# Patient Record
Sex: Male | Born: 1950 | Race: White | Hispanic: No | Smoking: Never smoker
Health system: Southern US, Academic
[De-identification: ages and names within clinical notes are randomized; demographics above are authoritative.]

## PROBLEM LIST (undated history)

## (undated) DIAGNOSIS — I4891 Unspecified atrial fibrillation: Secondary | ICD-10-CM

## (undated) DIAGNOSIS — I10 Essential (primary) hypertension: Secondary | ICD-10-CM

## (undated) DIAGNOSIS — S72009A Fracture of unspecified part of neck of unspecified femur, initial encounter for closed fracture: Secondary | ICD-10-CM

## (undated) DIAGNOSIS — K625 Hemorrhage of anus and rectum: Secondary | ICD-10-CM

## (undated) DIAGNOSIS — G43909 Migraine, unspecified, not intractable, without status migrainosus: Secondary | ICD-10-CM

## (undated) DIAGNOSIS — Z973 Presence of spectacles and contact lenses: Secondary | ICD-10-CM

## (undated) DIAGNOSIS — F419 Anxiety disorder, unspecified: Secondary | ICD-10-CM

## (undated) DIAGNOSIS — E7800 Pure hypercholesterolemia, unspecified: Secondary | ICD-10-CM

## (undated) DIAGNOSIS — Z7901 Long term (current) use of anticoagulants: Secondary | ICD-10-CM

## (undated) DIAGNOSIS — K219 Gastro-esophageal reflux disease without esophagitis: Secondary | ICD-10-CM

## (undated) DIAGNOSIS — Z9889 Other specified postprocedural states: Secondary | ICD-10-CM

## (undated) HISTORY — DX: Unspecified atrial fibrillation: I48.91

## (undated) HISTORY — DX: Long term (current) use of anticoagulants: Z79.01

## (undated) HISTORY — DX: Hemorrhage of anus and rectum: K62.5

## (undated) HISTORY — DX: Anxiety disorder, unspecified: F41.9

## (undated) HISTORY — DX: Migraine, unspecified, not intractable, without status migrainosus: G43.909

## (undated) HISTORY — DX: Essential (primary) hypertension: I10

## (undated) HISTORY — PX: KNEE ARTHROSCOPY: SUR90

## (undated) HISTORY — DX: Pure hypercholesterolemia, unspecified: E78.00

## (undated) HISTORY — DX: Other specified postprocedural states: Z98.890

---

## 2012-07-12 DIAGNOSIS — Z9889 Other specified postprocedural states: Secondary | ICD-10-CM | POA: Insufficient documentation

## 2012-07-12 HISTORY — DX: Other specified postprocedural states: Z98.890

## 2019-01-10 ENCOUNTER — Ambulatory Visit (HOSPITAL_COMMUNITY): Payer: Self-pay

## 2021-05-20 IMAGING — MR MRI KNEE LT W/O CONTRAST
5 series · 40 of 40 positions shown · IV contrast (gadolinium)
Comparison: None available.

﻿EXAM:  02049   MRI KNEE LT W/O CONTRAST
INDICATION: New onset knee pain.
TECHNIQUE: Multiplanar multisequential MRI of the left knee joint was performed without gadolinium contrast.

[Series 5: PD fat-sat · axial · left · 4.0mm · 0.53mm/px · z∈[-71,+59]mm · 8 of 30 slices shown (1 of 3)]
[im 1/30]
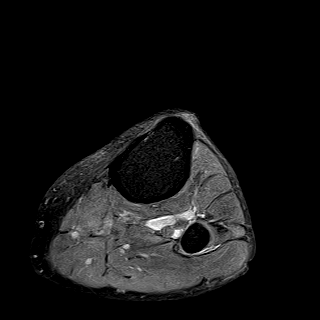
[im 5/30]
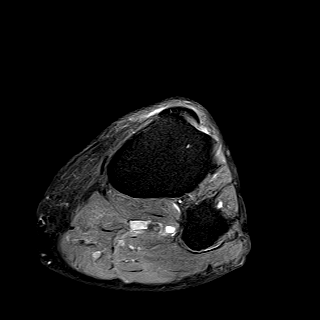
[im 9/30]
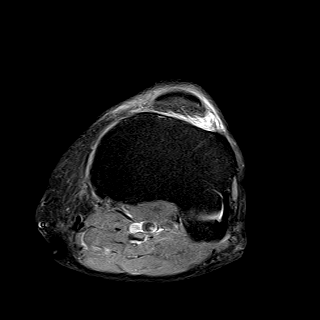
[im 13/30]
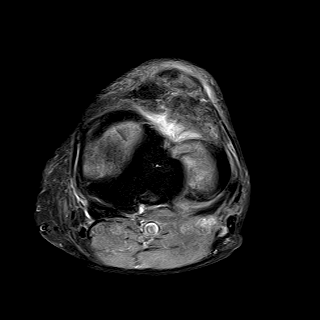
[im 17/30]
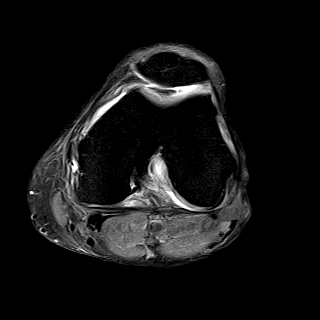
[im 21/30]
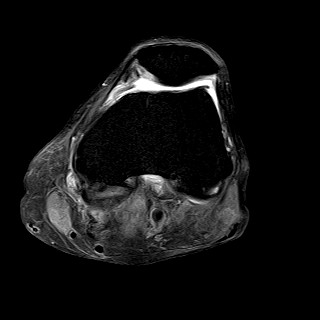
[im 25/30]
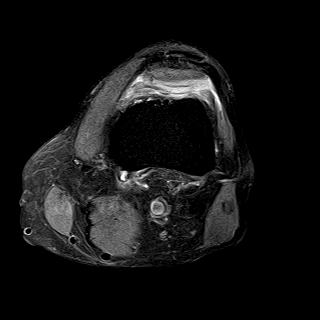
[im 30/30]
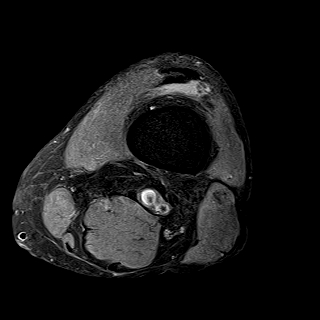

[Series 6: PD fat-sat · sagittal · left · 3.0mm · 0.47mm/px · 8 of 30 slices shown (2 of 3)]
[im 1/30]
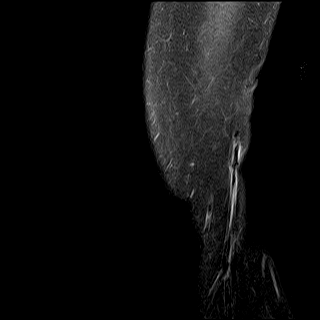
[im 5/30]
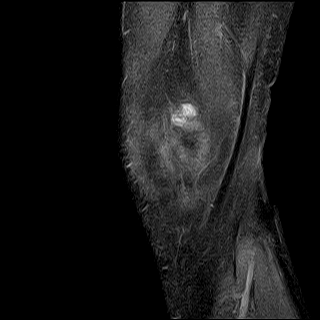
[im 9/30]
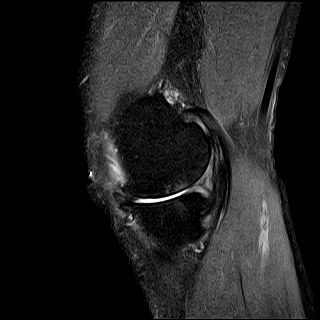
[im 13/30]
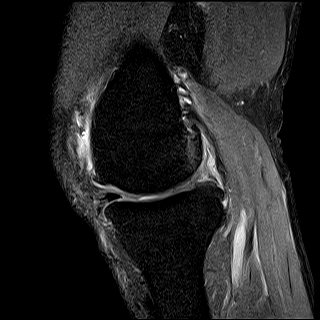
[im 17/30]
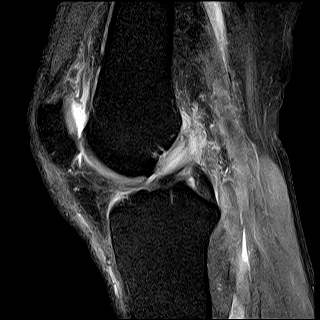
[im 21/30]
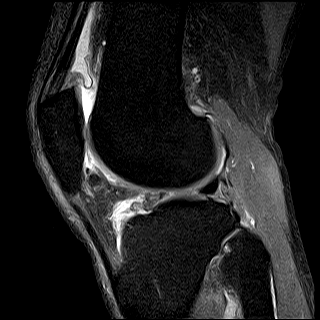
[im 25/30]
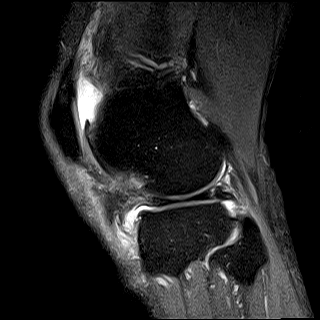
[im 30/30]
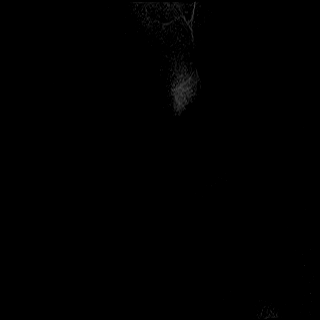

[Series 7: STIR · coronal · left · 3.0mm · 0.47mm/px · 8 of 28 slices shown]
[im 1/28]
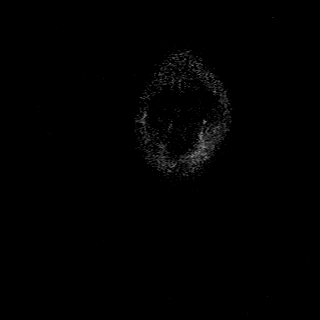
[im 4/28]
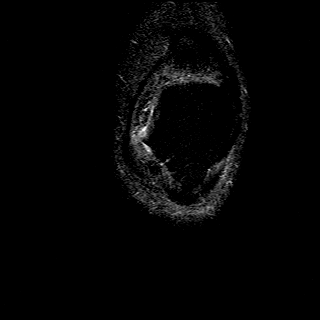
[im 8/28]
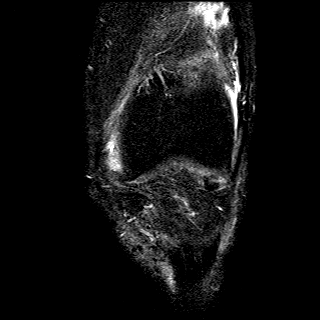
[im 12/28]
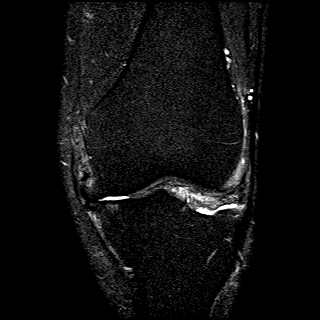
[im 16/28]
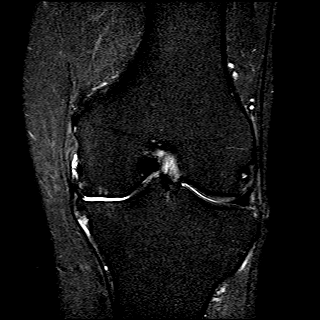
[im 20/28]
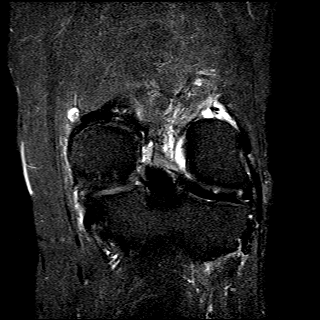
[im 24/28]
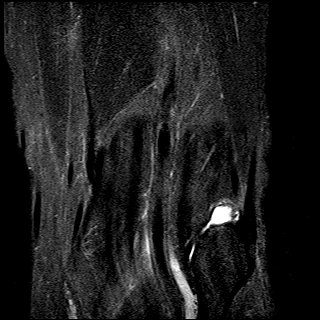
[im 28/28]
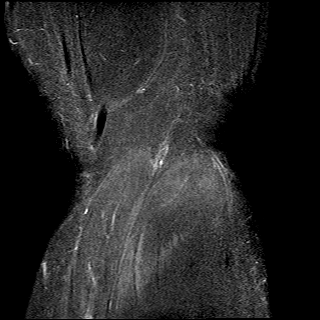

[Series 8: T1 · sagittal · left · 3.0mm · 0.39mm/px · 8 of 30 slices shown]
[im 1/30]
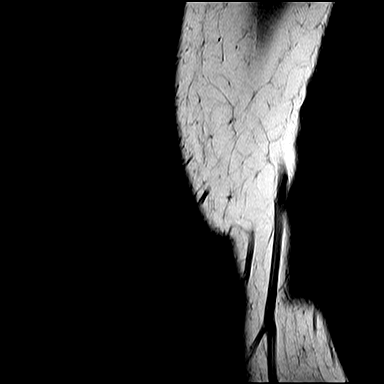
[im 5/30]
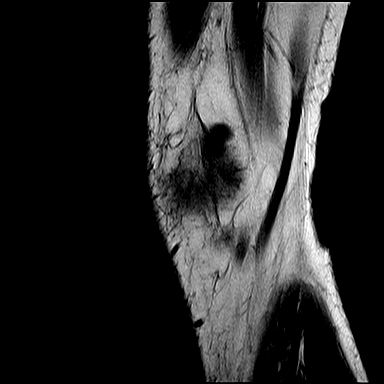
[im 9/30]
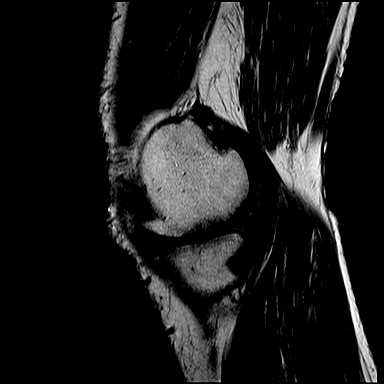
[im 13/30]
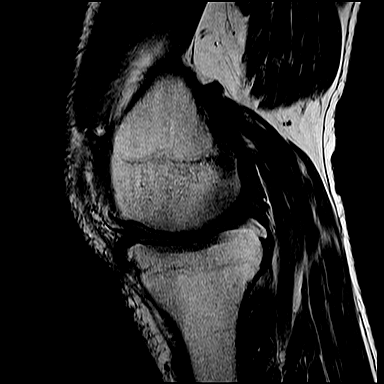
[im 17/30]
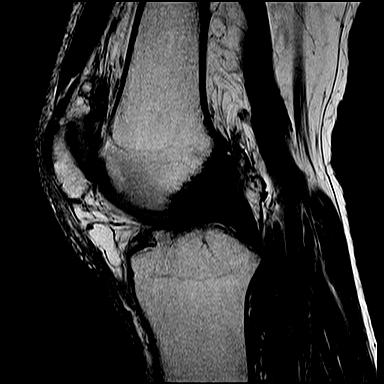
[im 21/30]
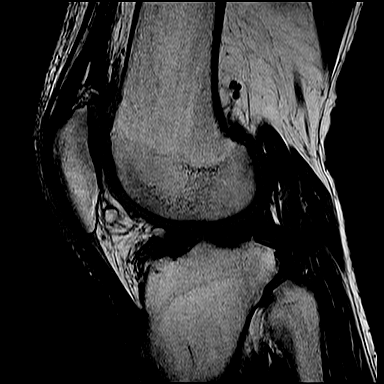
[im 25/30]
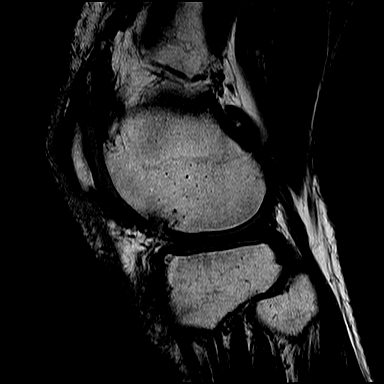
[im 30/30]
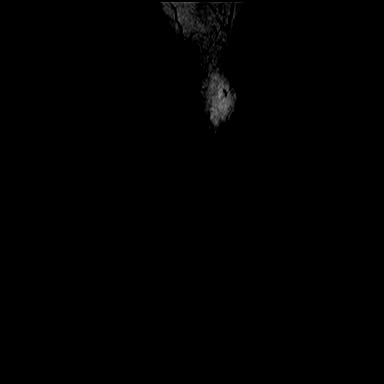

[Series 9: PD fat-sat · coronal · left · 3.0mm · 0.47mm/px · 8 of 28 slices shown (3 of 3)]
[im 1/28]
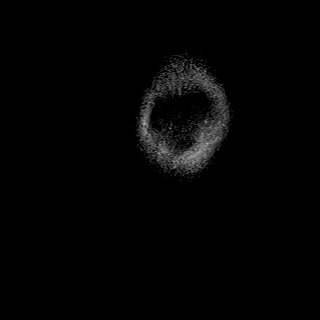
[im 4/28]
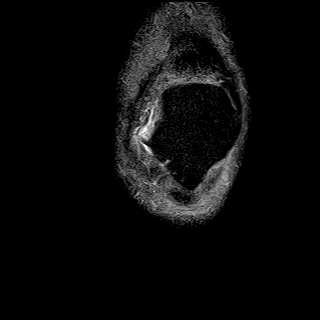
[im 8/28]
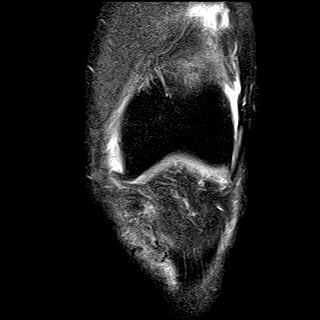
[im 12/28]
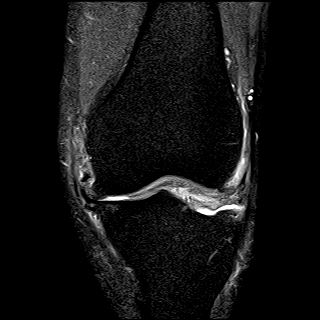
[im 16/28]
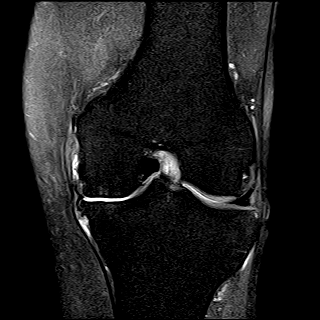
[im 20/28]
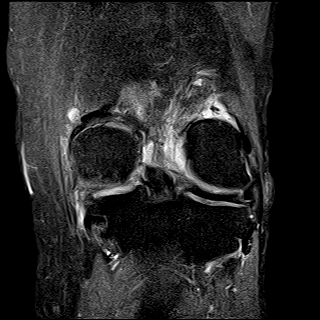
[im 24/28]
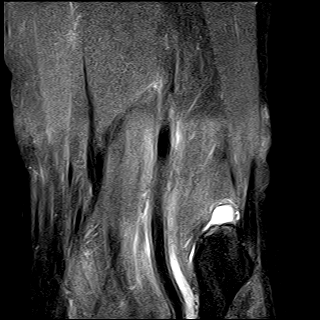
[im 28/28]
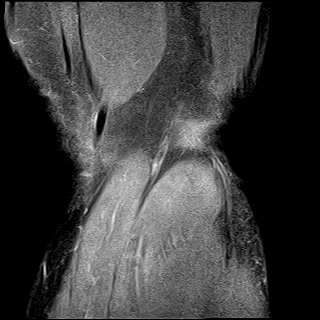

[40 of 40 positions shown; findings below may reference images not displayed]

FINDINGS: There is a complex medial meniscus tear. There is also suggestion of a subtle tear involving the anterior horn of the lateral meniscus. Cruciate and collateral ligaments are intact, within normal limits in morphology and signal intensity. There is grade 4 chondromalacia of the medial tibiofemoral articulation. Extensor mechanism is intact. A few intra-articular loose bodies are also seen inferior to the patella, measuring up to 8 mm. Capsular attachments appear unremarkable. Bone marrow signal intensity is normal. There is a small suprapatellar effusion. There is no Baker's cyst.
IMPRESSION: 1. Complex medial meniscus tear. 

2. Suggestion of a subtle tear involving the anterior horn of the lateral meniscus. 

3. Grade 4 chondromalacia of the medial tibiofemoral articulation and a few intra-articular loose bodies inferior to the patella.

## 2021-12-11 DIAGNOSIS — M1611 Unilateral primary osteoarthritis, right hip: Secondary | ICD-10-CM | POA: Insufficient documentation

## 2021-12-11 DIAGNOSIS — M7061 Trochanteric bursitis, right hip: Secondary | ICD-10-CM | POA: Insufficient documentation

## 2021-12-18 DIAGNOSIS — M79641 Pain in right hand: Secondary | ICD-10-CM | POA: Insufficient documentation

## 2022-02-18 ENCOUNTER — Telehealth (RURAL_HEALTH_CENTER): Payer: Self-pay | Admitting: Family Medicine

## 2022-02-18 ENCOUNTER — Encounter (RURAL_HEALTH_CENTER): Payer: Self-pay | Admitting: Family Medicine

## 2022-02-18 MED ORDER — PANTOPRAZOLE 40 MG TABLET,DELAYED RELEASE
40.0000 mg | DELAYED_RELEASE_TABLET | Freq: Every day | ORAL | 1 refills | Status: DC
Start: 2022-02-18 — End: 2022-04-02

## 2022-02-18 NOTE — Telephone Encounter (Signed)
Pharmacy called for refill pantoprazole dr bowling approved andsent/br

## 2022-02-27 ENCOUNTER — Telehealth (RURAL_HEALTH_CENTER): Payer: Self-pay | Admitting: Family Medicine

## 2022-02-27 MED ORDER — RIVAROXABAN 20 MG TABLET
20.0000 mg | ORAL_TABLET | Freq: Every evening | ORAL | 3 refills | Status: DC
Start: 2022-02-27 — End: 2022-04-02

## 2022-02-27 NOTE — Telephone Encounter (Signed)
Refill of Xarelto 2.'5mg'$  tablet

## 2022-02-27 NOTE — Telephone Encounter (Signed)
Refill sent/br

## 2022-03-03 ENCOUNTER — Encounter (RURAL_HEALTH_CENTER): Payer: Self-pay | Admitting: Family Medicine

## 2022-03-06 ENCOUNTER — Telehealth (RURAL_HEALTH_CENTER): Payer: Self-pay | Admitting: Family Medicine

## 2022-03-06 NOTE — Telephone Encounter (Signed)
Acyclovir 400 mg tablet   1 tab every 8 hours

## 2022-03-09 MED ORDER — ACYCLOVIR 400 MG TABLET
400.0000 mg | ORAL_TABLET | Freq: Three times a day (TID) | ORAL | 1 refills | Status: DC
Start: 2022-03-09 — End: 2022-04-02

## 2022-03-09 NOTE — Telephone Encounter (Signed)
Approved dr bowling and sent/br

## 2022-04-02 ENCOUNTER — Encounter (RURAL_HEALTH_CENTER): Payer: Self-pay | Admitting: Family Medicine

## 2022-04-02 ENCOUNTER — Ambulatory Visit (RURAL_HEALTH_CENTER): Payer: Managed Care, Other (non HMO) | Attending: Family Medicine | Admitting: Family Medicine

## 2022-04-02 ENCOUNTER — Other Ambulatory Visit: Payer: Self-pay

## 2022-04-02 ENCOUNTER — Ambulatory Visit: Payer: Managed Care, Other (non HMO) | Attending: Family Medicine | Admitting: Family Medicine

## 2022-04-02 VITALS — BP 120/70 | HR 68 | Temp 97.8°F | Ht 68.0 in | Wt 176.0 lb

## 2022-04-02 DIAGNOSIS — E559 Vitamin D deficiency, unspecified: Secondary | ICD-10-CM

## 2022-04-02 DIAGNOSIS — G43109 Migraine with aura, not intractable, without status migrainosus: Secondary | ICD-10-CM

## 2022-04-02 DIAGNOSIS — E162 Hypoglycemia, unspecified: Secondary | ICD-10-CM | POA: Insufficient documentation

## 2022-04-02 DIAGNOSIS — E785 Hyperlipidemia, unspecified: Secondary | ICD-10-CM

## 2022-04-02 DIAGNOSIS — F411 Generalized anxiety disorder: Secondary | ICD-10-CM

## 2022-04-02 DIAGNOSIS — R5382 Chronic fatigue, unspecified: Secondary | ICD-10-CM | POA: Insufficient documentation

## 2022-04-02 DIAGNOSIS — I10 Essential (primary) hypertension: Secondary | ICD-10-CM

## 2022-04-02 DIAGNOSIS — M542 Cervicalgia: Secondary | ICD-10-CM

## 2022-04-02 DIAGNOSIS — I480 Paroxysmal atrial fibrillation: Secondary | ICD-10-CM | POA: Insufficient documentation

## 2022-04-02 DIAGNOSIS — E538 Deficiency of other specified B group vitamins: Secondary | ICD-10-CM

## 2022-04-02 DIAGNOSIS — G43909 Migraine, unspecified, not intractable, without status migrainosus: Secondary | ICD-10-CM | POA: Insufficient documentation

## 2022-04-02 DIAGNOSIS — E7800 Pure hypercholesterolemia, unspecified: Secondary | ICD-10-CM | POA: Insufficient documentation

## 2022-04-02 DIAGNOSIS — D485 Neoplasm of uncertain behavior of skin: Secondary | ICD-10-CM

## 2022-04-02 LAB — COMPREHENSIVE METABOLIC PANEL, NON-FASTING
ALBUMIN/GLOBULIN RATIO: 1.7 — ABNORMAL HIGH (ref 0.8–1.4)
ALBUMIN: 4.1 g/dL (ref 3.5–5.7)
ALKALINE PHOSPHATASE: 47 U/L (ref 34–104)
ALT (SGPT): 17 U/L (ref 7–52)
ANION GAP: 3 mmol/L — ABNORMAL LOW (ref 10–20)
AST (SGOT): 20 U/L (ref 13–39)
BILIRUBIN TOTAL: 0.5 mg/dL (ref 0.3–1.2)
BUN/CREA RATIO: 19 (ref 6–22)
BUN: 21 mg/dL (ref 7–25)
CALCIUM, CORRECTED: 9.1 mg/dL (ref 8.9–10.8)
CALCIUM: 9.2 mg/dL (ref 8.6–10.3)
CHLORIDE: 111 mmol/L — ABNORMAL HIGH (ref 98–107)
CO2 TOTAL: 28 mmol/L (ref 21–31)
CREATININE: 1.09 mg/dL (ref 0.60–1.30)
ESTIMATED GFR: 72 mL/min/{1.73_m2} (ref >59)
GLOBULIN: 2.4 — ABNORMAL LOW (ref 2.9–5.4)
GLUCOSE: 89 mg/dL (ref 74–109)
OSMOLALITY, CALCULATED: 286 mOsm/kg (ref 270–290)
POTASSIUM: 4.4 mmol/L (ref 3.5–5.1)
PROTEIN TOTAL: 6.5 g/dL (ref 6.4–8.9)
SODIUM: 142 mmol/L (ref 136–145)

## 2022-04-02 LAB — CBC WITH DIFF
BASOPHIL #: 0 10*3/uL (ref 0.00–0.30)
BASOPHIL %: 1 % (ref 0–3)
EOSINOPHIL #: 0.1 10*3/uL (ref 0.00–0.80)
EOSINOPHIL %: 2 % (ref 0–7)
HCT: 40.7 % — ABNORMAL LOW (ref 42.0–51.0)
HGB: 13.8 g/dL (ref 13.5–18.0)
LYMPHOCYTE #: 1.2 10*3/uL (ref 1.10–5.00)
LYMPHOCYTE %: 30 % (ref 25–45)
MCH: 32.7 pg — ABNORMAL HIGH (ref 27.0–32.0)
MCHC: 34 g/dL (ref 32.0–36.0)
MCV: 96.3 fL (ref 78.0–99.0)
MONOCYTE #: 0.3 10*3/uL (ref 0.00–1.30)
MONOCYTE %: 6 % (ref 0–12)
MPV: 8.2 fL (ref 7.4–10.4)
NEUTROPHIL #: 2.5 10*3/uL (ref 1.80–8.40)
NEUTROPHIL %: 61 % (ref 40–76)
PLATELETS: 202 10*3/uL (ref 140–440)
RBC: 4.23 10*6/uL (ref 4.20–6.00)
RDW: 13.4 % (ref 11.6–14.8)
WBC: 4 10*3/uL (ref 4.0–10.5)
WBCS UNCORRECTED: 4 10*3/uL

## 2022-04-02 LAB — THYROID STIMULATING HORMONE (SENSITIVE TSH): TSH: 0.974 u[IU]/mL (ref 0.450–5.330)

## 2022-04-02 LAB — LIPID PANEL
CHOL/HDL RATIO: 3.8
CHOLESTEROL: 200 mg/dL — ABNORMAL HIGH (ref <200)
HDL CHOL: 52 mg/dL (ref 23–92)
LDL CALC: 92 mg/dL (ref 0–100)
TRIGLYCERIDES: 278 mg/dL — ABNORMAL HIGH (ref <=150)
VLDL CALC: 56 mg/dL — ABNORMAL HIGH (ref 0–50)

## 2022-04-02 LAB — VITAMIN D 25 TOTAL: VITAMIN D: 45 ng/mL (ref 30–100)

## 2022-04-02 LAB — IRON TRANSFERRIN AND TIBC
IRON (TRANSFERRIN) SATURATION: 45 % (ref 20–50)
IRON: 129 ug/dL (ref 50–212)
TOTAL IRON BINDING CAPACITY: 287 ug/dL (ref 250–450)
TRANSFERRIN: 205 mg/dL (ref 203–362)
UIBC: 158 ug/dL (ref 130–375)

## 2022-04-02 LAB — VITAMIN B12: VITAMIN B 12: 408 pg/mL (ref 180–914)

## 2022-04-02 LAB — HGA1C (HEMOGLOBIN A1C WITH EST AVG GLUCOSE): HEMOGLOBIN A1C: 5.5 % (ref 4.0–6.0)

## 2022-04-02 LAB — MAGNESIUM: MAGNESIUM: 2.1 mg/dL (ref 1.9–2.7)

## 2022-04-02 MED ORDER — ROSUVASTATIN 10 MG TABLET
10.0000 mg | ORAL_TABLET | Freq: Every evening | ORAL | 3 refills | Status: DC
Start: 2022-04-02 — End: 2022-04-09

## 2022-04-02 MED ORDER — TAMSULOSIN 0.4 MG CAPSULE
0.4000 mg | ORAL_CAPSULE | Freq: Every evening | ORAL | 3 refills | Status: DC
Start: 2022-04-02 — End: 2022-04-09

## 2022-04-02 MED ORDER — CLONAZEPAM 1 MG TABLET
1.0000 mg | ORAL_TABLET | Freq: Two times a day (BID) | ORAL | 0 refills | Status: DC
Start: 2022-04-02 — End: 2022-04-09

## 2022-04-02 MED ORDER — ACYCLOVIR 400 MG TABLET
400.0000 mg | ORAL_TABLET | Freq: Three times a day (TID) | ORAL | 1 refills | Status: DC
Start: 2022-04-02 — End: 2022-04-09

## 2022-04-02 MED ORDER — LISINOPRIL 20 MG TABLET
20.0000 mg | ORAL_TABLET | Freq: Two times a day (BID) | ORAL | 3 refills | Status: DC
Start: 2022-04-02 — End: 2022-04-09

## 2022-04-02 MED ORDER — CYANOCOBALAMIN (VIT B-12) 1,000 MCG/ML INJECTION SOLUTION
1000.0000 ug | INTRAMUSCULAR | 3 refills | Status: DC
Start: 2022-04-02 — End: 2022-04-09

## 2022-04-02 MED ORDER — RIVAROXABAN 20 MG TABLET
20.0000 mg | ORAL_TABLET | Freq: Every evening | ORAL | 3 refills | Status: DC
Start: 2022-04-02 — End: 2022-04-09

## 2022-04-02 MED ORDER — NURTEC ODT 75 MG DISINTEGRATING TABLET
1.0000 | ORAL_TABLET | Freq: Every day | ORAL | 5 refills | Status: DC
Start: 2022-04-02 — End: 2022-04-09

## 2022-04-02 MED ORDER — BACLOFEN 10 MG TABLET
10.0000 mg | ORAL_TABLET | Freq: Two times a day (BID) | ORAL | 0 refills | Status: DC
Start: 2022-04-02 — End: 2022-04-09

## 2022-04-02 MED ORDER — PANTOPRAZOLE 40 MG TABLET,DELAYED RELEASE
40.0000 mg | DELAYED_RELEASE_TABLET | Freq: Every day | ORAL | 1 refills | Status: DC
Start: 2022-04-02 — End: 2022-04-09

## 2022-04-02 NOTE — Procedures (Signed)
FAMILY MEDICINE, [place]  [place] [address]  Operated by [facility]  Procedure Note    Name: Ricky Grimes MRN:  [id number]   Date: 04/02/2022 Age: 71 y.o.       Generic Procedure  Performed by: Feliberto Harts, DO  Authorized by: Feliberto Harts, DO     Documentation:      Lesion left ear. Cleansed area with etoh X 3.  Debrided with gauze which caused the area to ooze consistently.  Liquid cryotherapy to the area X 5.  Silver nitrate for hemostasis.  Apply pressure if bleeding returns.        Feliberto Harts, DO

## 2022-04-02 NOTE — Progress Notes (Signed)
FAMILY MEDICINE, [place]  [place] [address]  Operated by [facility]     Name: Ricky Grimes MRN:  [id number]   Date of Birth: 1951/05/17 Age: 71 y.o.   Date: 04/02/2022  Time: 20:36     Provider: Feliberto Harts, DO    Assessment/Plan:    1. Neck pain  Tylenol with codeine did not help. Stop. Recommend try massage and get xrays. Ice packs/Heat.  May consider PT. Has been hanging drywall over his head and he can't quit now.  - XR CERVICAL SPINE COMPLETE (4 OR MORE VIEWS); Future  - AMB CONSULT/REFERRAL MASSAGE THERAPY-EXT; Future    2. Migraine with aura and without status migrainosus, not intractable  Nurtec helps relieve migraines. Rx given    3. Paroxysmal atrial fibrillation (CMS HCC)  Continue xarelto    4. B12 deficiency  Continue B12 once per week    5. Vitamin D deficiency  Recheck vitamin d levels  - VITAMIN D 25 TOTAL    6. Chronic fatigue    - VITAMIN B12  - IRON TRANSFERRIN AND TIBC    7. Primary hypertension  Controlled. Continue lisinopril  - THYROID STIMULATING HORMONE (SENSITIVE TSH)  - COMPREHENSIVE METABOLIC PANEL, NON-FASTING  - CBC/DIFF    8. Hypomagnesemia  - MAGNESIUM    9. Hyperlipidemia, unspecified hyperlipidemia type  Continue Crestor at night. Recheck lipids  - LIPID PANEL    10. Hypoglycemia  - HGA1C (HEMOGLOBIN A1C WITH EST AVG GLUCOSE)  Recommend eating meals with high protein or eat small, frequent meals.    11. GAD  Klonopin helps. Prescription refilled. Gets really anxious and has a difficult time without the medication. Will continue    12 Lesion of unknown malignant potential   I am concerned that this is a SCC so I did my best to eradicate it.  If it returns, he will need to see derm.      No follow-ups on file.  Orders Placed This Encounter   . XR CERVICAL SPINE COMPLETE (4 OR MORE VIEWS)   . HGA1C (HEMOGLOBIN A1C WITH EST AVG GLUCOSE)   . LIPID PANEL   . VITAMIN B12   . VITAMIN D 25 TOTAL   . THYROID  STIMULATING HORMONE (SENSITIVE TSH)   . COMPREHENSIVE METABOLIC PANEL, NON-FASTING   . CBC/DIFF   . IRON TRANSFERRIN AND TIBC   . MAGNESIUM   . CBC WITH DIFF   . AMB CONSULT/REFERRAL MASSAGE THERAPY-EXT   . tamsulosin (FLOMAX) 0.4 mg Oral Capsule   . rosuvastatin (CRESTOR) 10 mg Oral Tablet   . rivaroxaban (XARELTO) 20 mg Oral Tablet   . rimegepant (NURTEC ODT) 75 mg Oral Tablet, Rapid Dissolve   . pantoprazole (PROTONIX) 40 mg Oral Tablet, Delayed Release (E.C.)   . lisinopriL (PRINIVIL) 20 mg Oral Tablet   . cyanocobalamin (VITAMIN B12) 1,000 mcg/mL Injection Solution   . clonazePAM (KLONOPIN) 1 mg Oral Tablet   . baclofen (LIORESAL) 10 mg Oral Tablet   . acyclovir (ZOVIRAX) 400 mg Oral Tablet        Discontinued Medications    ACETAMINOPHEN-CODEINE (TYLENOL #4) 300-60 MG ORAL TABLET    Take 1 Tablet by mouth Twice daily    AMLODIPINE (NORVASC) 5 MG ORAL TABLET    Take 1 Tablet (5 mg total) by mouth Once a day    METOPROLOL SUCCINATE (TOPROL-XL) 25 MG ORAL TABLET SUSTAINED RELEASE 24 HR    Take 1  Tablet (25 mg total) by mouth Once a day    ROPINIROLE (REQUIP) 1 MG ORAL TABLET    Take 1 Tablet (1 mg total) by mouth Every night    TIZANIDINE (ZANAFLEX) 2 MG ORAL TABLET    Take 1 Tablet (2 mg total) by mouth Three times a day      Reason for visit: Follow Up 3 Months (Was scheduled for fatigue however he states he is for regular visit having fatigue, neck pain)      History of Present Illness:  Ricky Grimes is a 71 y.o. male with atrial fibrillation, GERD, Migraine headaches, Chronic knee pain, hyperlipidemia and B12 deficiency presents in follow up for chronic disease management.      He has a place on his left ear. It was there at his last visit and we applied liquid nitrogen. He notes that all the other places went away, but he still has this one. Today, it was bleeding in the shower.    He also has increasing neck pain. He has tried tylenol with codeine which does not help.    Historical Data    Past Medical  History:  Past Medical History:   Diagnosis Date   . Anticoagulant long-term use    . Anxiety    . Atrial fibrillation (CMS HCC)    . Hypercholesterolemia    . Hypertension    . Migraine          Past Surgical History:  Past Surgical History:   Procedure Laterality Date   . KNEE ARTHROSCOPY Left          Allergies:  Allergies   Allergen Reactions   . Metoclopramide  Other Adverse Reaction (Add comment)     Shakes unable to be still     Medications:  Butenafine 1 % Cream, Apply topically Topical twice daily  cholecalciferol, vitamin D3, 100 mcg (4,000 unit) Oral Tablet, Take 1 Tablet (4,000 Units total) by mouth Once a day  NALOXONE 4 MG/ACTUATION NASAL SPRAY - ED TO GO, 1 Spray by INTRANASAL route Every 2 minutes as needed for actual or suspected opioid overdose. Call 911 if used.  nitroGLYCERIN (NITROSTAT) 0.4 mg Sublingual Tablet, Sublingual, Place 1 Tablet (0.4 mg total) under the tongue Every 5 minutes as needed for Chest pain for 3 doses over 15 minutes  acetaminophen-codeine (TYLENOL #4) 300-60 mg Oral Tablet, Take 1 Tablet by mouth Twice daily  acyclovir (ZOVIRAX) 400 mg Oral Tablet, Take 1 Tablet (400 mg total) by mouth Three times a day  amLODIPine (NORVASC) 5 mg Oral Tablet, Take 1 Tablet (5 mg total) by mouth Once a day (Patient not taking: Reported on 04/02/2022)  baclofen (LIORESAL) 10 mg Oral Tablet, Take 1 Tablet (10 mg total) by mouth Once a day  clonazePAM (KLONOPIN) 1 mg Oral Tablet, Take 1 Tablet (1 mg total) by mouth Twice daily  cyanocobalamin (VITAMIN B12) 1,000 mcg/mL Injection Solution, Inject 1 mL (1,000 mcg total) into the muscle Every 30 days  lisinopriL (PRINIVIL) 20 mg Oral Tablet, Take 1 Tablet (20 mg total) by mouth Twice daily  metoprolol succinate (TOPROL-XL) 25 mg Oral Tablet Sustained Release 24 hr, Take 1 Tablet (25 mg total) by mouth Once a day (Patient not taking: Reported on 04/02/2022)  pantoprazole (PROTONIX) 40 mg Oral Tablet, Delayed Release (E.C.), Take 1 Tablet (40 mg total)  by mouth Once a day  rimegepant (NURTEC ODT) 75 mg Oral Tablet, Rapid Dissolve, Take 1 Tablet (75 mg total) by mouth  rivaroxaban (XARELTO) 20 mg Oral Tablet, Take 1 Tablet (20 mg total) by mouth Every evening with dinner  rOPINIRole (REQUIP) 1 mg Oral Tablet, Take 1 Tablet (1 mg total) by mouth Every night (Patient not taking: Reported on 04/02/2022)  rosuvastatin (CRESTOR) 10 mg Oral Tablet, Take 1 Tablet (10 mg total) by mouth Every evening  tamsulosin (FLOMAX) 0.4 mg Oral Capsule, Take 1 Capsule (0.4 mg total) by mouth Every evening after dinner  tiZANidine (ZANAFLEX) 2 mg Oral Tablet, Take 1 Tablet (2 mg total) by mouth Three times a day (Patient not taking: Reported on 04/02/2022)    No facility-administered medications prior to visit.     Family History:  Family Medical History:     Problem Relation (Age of Onset)    Atrial fibrillation Father    Cancer Father    No Known Problems Mother, Sister, Brother, Half-Sister, Half-Brother, Maternal Aunt, Maternal Uncle, Paternal 15, Paternal Uncle, Maternal Grandmother, Maternal Grandfather, Paternal 79, Paternal Grandfather, Daughter, Son          Social History:  Social History     Socioeconomic History   . Marital status: Widowed   . Number of children: 2   . Years of education: 12   Occupational History   . 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 Smoking status: Never   . Smokeless tobacco: Never   Vaping Use   . Vaping Use: Never used   Substance and Sexual Activity   . Alcohol use: Yes   . Drug use: Never   . Sexual activity: Yes     Partners: Female     Birth control/protection: None           Review of Systems:  Any pertinent Review of Systems as addressed in the HPI above.    Physical Exam:  Vital Signs:  Vitals:    04/02/22 1157   BP: 120/70   Pulse: 68   Temp: 36.6 C (97.8 F)   TempSrc: Tympanic   SpO2: 99%   Weight: 79.8 kg (176 lb)   Height: 1.727 m ('5\' 8"'$ )   BMI: 26.82     Physical Exam  Constitutional:       Appearance: Normal  appearance.   HENT:      Head: Normocephalic and atraumatic.      Right Ear: Tympanic membrane normal.      Left Ear: Tympanic membrane normal.      Nose: Nose normal.      Mouth/Throat:      Mouth: Mucous membranes are moist.      Pharynx: No oropharyngeal exudate or posterior oropharyngeal erythema.   Eyes:      Extraocular Movements: Extraocular movements intact.      Conjunctiva/sclera: Conjunctivae normal.   Cardiovascular:      Rate and Rhythm: Normal rate and regular rhythm.      Heart sounds: Normal heart sounds.   Pulmonary:      Effort: Pulmonary effort is normal.      Breath sounds: Normal breath sounds.   Abdominal:      Palpations: Abdomen is soft.      Tenderness: There is no abdominal tenderness.   Musculoskeletal:         General: No swelling.      Cervical back: Neck supple. Swelling, spasms, tenderness and crepitus present. No erythema, signs of trauma, lacerations or rigidity. Decreased range of motion.      Right lower leg: No edema.  Left lower leg: No edema.   Lymphadenopathy:      Cervical: No cervical adenopathy.   Skin:     General: Skin is warm and dry.      Findings: Lesion present. No rash.      Comments: Left ear with thickened scale   Neurological:      General: No focal deficit present.      Mental Status: He is alert and oriented to person, place, and time.   Psychiatric:         Mood and Affect: Mood normal.         Behavior: Behavior normal.         Thought Content: Thought content normal.         Judgment: Judgment normal.       Feliberto Harts, DO     Portions of this note may be dictated using voice recognition software or a dictation service. Variances in spelling and vocabulary are possible and unintentional. Not all errors are caught/corrected. Please notify the [facility] if any discrepancies are noted or if the meaning of any statement is not clear.

## 2022-04-03 ENCOUNTER — Telehealth (RURAL_HEALTH_CENTER): Payer: Self-pay | Admitting: Family Medicine

## 2022-04-03 NOTE — Telephone Encounter (Signed)
Message left for patient to call back for results since he did not answer phone/br

## 2022-04-03 NOTE — Telephone Encounter (Signed)
-----   Message from [place], [place] sent at 04/03/2022 10:44 AM EDT -----  Cholesterol is a little elevated. Avoid sugary intake. Continue Crestor. Make sure to take it at night    Chronic kidney disease. Avoid nsaids    Mild anemia. Take multivitamin.  Let me know if you have any bloody or tarry looking stools    No change in meds

## 2022-04-09 ENCOUNTER — Other Ambulatory Visit (RURAL_HEALTH_CENTER): Payer: Self-pay | Admitting: Family Medicine

## 2022-04-09 MED ORDER — TAMSULOSIN 0.4 MG CAPSULE
0.4000 mg | ORAL_CAPSULE | Freq: Every evening | ORAL | 3 refills | Status: DC
Start: 2022-04-09 — End: 2023-03-18

## 2022-04-09 MED ORDER — ACYCLOVIR 400 MG TABLET
400.0000 mg | ORAL_TABLET | Freq: Three times a day (TID) | ORAL | 1 refills | Status: DC
Start: 2022-04-09 — End: 2022-06-11

## 2022-04-09 MED ORDER — ROSUVASTATIN 10 MG TABLET
10.0000 mg | ORAL_TABLET | Freq: Every evening | ORAL | 3 refills | Status: DC
Start: 2022-04-09 — End: 2022-12-22

## 2022-04-09 MED ORDER — LISINOPRIL 20 MG TABLET
20.0000 mg | ORAL_TABLET | Freq: Two times a day (BID) | ORAL | 3 refills | Status: DC
Start: 2022-04-09 — End: 2022-09-25

## 2022-04-09 MED ORDER — CHOLECALCIFEROL (VITAMIN D3) 100 MCG (4,000 UNIT) TABLET
4000.0000 [IU] | ORAL_TABLET | Freq: Every day | ORAL | 3 refills | Status: DC
Start: 2022-04-09 — End: 2024-04-04

## 2022-04-09 MED ORDER — RIVAROXABAN 20 MG TABLET
20.0000 mg | ORAL_TABLET | Freq: Every evening | ORAL | 3 refills | Status: DC
Start: 2022-04-09 — End: 2022-09-25

## 2022-04-09 MED ORDER — PANTOPRAZOLE 40 MG TABLET,DELAYED RELEASE
40.0000 mg | DELAYED_RELEASE_TABLET | Freq: Every day | ORAL | 1 refills | Status: DC
Start: 2022-04-09 — End: 2022-09-25

## 2022-04-09 MED ORDER — CYANOCOBALAMIN (VIT B-12) 1,000 MCG/ML INJECTION SOLUTION
1000.0000 ug | INTRAMUSCULAR | 3 refills | Status: DC
Start: 2022-04-09 — End: 2022-09-25

## 2022-04-09 MED ORDER — CLONAZEPAM 1 MG TABLET
1.0000 mg | ORAL_TABLET | Freq: Two times a day (BID) | ORAL | 0 refills | Status: DC
Start: 2022-04-09 — End: 2022-09-25

## 2022-04-09 MED ORDER — NURTEC ODT 75 MG DISINTEGRATING TABLET
1.0000 | ORAL_TABLET | Freq: Every day | ORAL | 5 refills | Status: DC
Start: 2022-04-09 — End: 2022-09-25

## 2022-04-09 MED ORDER — BACLOFEN 10 MG TABLET
10.0000 mg | ORAL_TABLET | Freq: Two times a day (BID) | ORAL | 0 refills | Status: DC
Start: 2022-04-09 — End: 2022-12-22

## 2022-04-09 NOTE — Telephone Encounter (Signed)
Patient is informed of results he voiced understanding note he states none of his medication was sent to [facility] can you send?/br

## 2022-04-09 NOTE — Result Encounter Note (Signed)
Patient informed of results and he voiced understanding however he  states none of his medication was sent to [facility] can you send?/br

## 2022-04-09 NOTE — Telephone Encounter (Signed)
-----   Message from [place], [place] sent at 04/03/2022 10:44 AM EDT -----  Cholesterol is a little elevated. Avoid sugary intake. Continue Crestor. Make sure to take it at night    Chronic kidney disease. Avoid nsaids    Mild anemia. Take multivitamin.  Let me know if you have any bloody or tarry looking stools    No change in meds

## 2022-04-09 NOTE — Telephone Encounter (Signed)
-----   Message from [place], [place] sent at 04/03/2022 10:44 AM EDT -----  Cholesterol is a little elevated. Avoid sugary intake. Continue Crestor. Make sure to take it at night    Chronic kidney disease. Avoid nsaids    Mild anemia. Take multivitamin.  Let me know if you have any bloody or tarry looking stools    No change in meds

## 2022-06-11 ENCOUNTER — Other Ambulatory Visit (RURAL_HEALTH_CENTER): Payer: Self-pay | Admitting: Family Medicine

## 2022-06-15 ENCOUNTER — Other Ambulatory Visit (RURAL_HEALTH_CENTER): Payer: Self-pay | Admitting: Family Medicine

## 2022-06-15 DIAGNOSIS — K921 Melena: Secondary | ICD-10-CM

## 2022-06-15 MED ORDER — MAGNESIUM CITRATE ORAL SOLUTION
296.0000 mL | Freq: Once | ORAL | 0 refills | Status: AC
Start: 2022-06-15 — End: 2022-06-15

## 2022-06-15 MED ORDER — LINACLOTIDE 72 MCG CAPSULE
72.0000 ug | ORAL_CAPSULE | Freq: Every morning | ORAL | 3 refills | Status: DC
Start: 2022-06-15 — End: 2022-09-25

## 2022-06-15 NOTE — Progress Notes (Signed)
Pt called with c/o hematochezia and no BM for 3 days. This has became an issue. Will tx empirically with mag citrate and add linzess. Needs endoscopy due to hematochezia. To ed if develop significant issues

## 2022-07-08 ENCOUNTER — Telehealth (RURAL_HEALTH_CENTER): Payer: Self-pay | Admitting: Family Medicine

## 2022-07-08 ENCOUNTER — Ambulatory Visit (RURAL_HEALTH_CENTER): Payer: Managed Care, Other (non HMO) | Attending: Family Medicine

## 2022-07-08 ENCOUNTER — Ambulatory Visit: Payer: Managed Care, Other (non HMO) | Attending: Family Medicine | Admitting: Family Medicine

## 2022-07-08 ENCOUNTER — Other Ambulatory Visit: Payer: Self-pay

## 2022-07-08 ENCOUNTER — Other Ambulatory Visit (RURAL_HEALTH_CENTER): Payer: Self-pay | Admitting: Family Medicine

## 2022-07-08 DIAGNOSIS — K921 Melena: Secondary | ICD-10-CM | POA: Insufficient documentation

## 2022-07-08 DIAGNOSIS — K625 Hemorrhage of anus and rectum: Secondary | ICD-10-CM

## 2022-07-08 LAB — CBC WITH DIFF
BASOPHIL #: 0 10*3/uL (ref 0.00–0.30)
BASOPHIL %: 1 % (ref 0–3)
EOSINOPHIL #: 0.1 10*3/uL (ref 0.00–0.80)
EOSINOPHIL %: 2 % (ref 0–7)
HCT: 38.7 % — ABNORMAL LOW (ref 42.0–51.0)
HGB: 12.9 g/dL — ABNORMAL LOW (ref 13.5–18.0)
LYMPHOCYTE #: 1.4 10*3/uL (ref 1.10–5.00)
LYMPHOCYTE %: 29 % (ref 25–45)
MCH: 31.9 pg (ref 27.0–32.0)
MCHC: 33.4 g/dL (ref 32.0–36.0)
MCV: 95.7 fL (ref 78.0–99.0)
MONOCYTE #: 0.3 10*3/uL (ref 0.00–1.30)
MONOCYTE %: 6 % (ref 0–12)
MPV: 8.8 fL (ref 7.4–10.4)
NEUTROPHIL #: 3 10*3/uL (ref 1.80–8.40)
NEUTROPHIL %: 63 % (ref 40–76)
PLATELETS: 212 10*3/uL (ref 140–440)
RBC: 4.04 10*6/uL — ABNORMAL LOW (ref 4.20–6.00)
RDW: 13.7 % (ref 11.6–14.8)
WBC: 4.8 10*3/uL (ref 4.0–10.5)
WBCS UNCORRECTED: 4.8 10*3/uL

## 2022-07-08 LAB — COMPREHENSIVE METABOLIC PANEL, NON-FASTING
ALBUMIN/GLOBULIN RATIO: 1.8 — ABNORMAL HIGH (ref 0.8–1.4)
ALBUMIN: 3.9 g/dL (ref 3.5–5.7)
ALKALINE PHOSPHATASE: 40 U/L (ref 34–104)
ALT (SGPT): 20 U/L (ref 7–52)
ANION GAP: 4 mmol/L — ABNORMAL LOW (ref 10–20)
AST (SGOT): 19 U/L (ref 13–39)
BILIRUBIN TOTAL: 0.4 mg/dL (ref 0.3–1.2)
BUN/CREA RATIO: 19 (ref 6–22)
BUN: 21 mg/dL (ref 7–25)
CALCIUM, CORRECTED: 9.2 mg/dL (ref 8.9–10.8)
CALCIUM: 9.1 mg/dL (ref 8.6–10.3)
CHLORIDE: 110 mmol/L — ABNORMAL HIGH (ref 98–107)
CO2 TOTAL: 25 mmol/L (ref 21–31)
CREATININE: 1.1 mg/dL (ref 0.60–1.30)
ESTIMATED GFR: 71 mL/min/{1.73_m2} (ref >59)
GLOBULIN: 2.2 — ABNORMAL LOW (ref 2.9–5.4)
GLUCOSE: 104 mg/dL (ref 74–109)
OSMOLALITY, CALCULATED: 281 mOsm/kg (ref 270–290)
POTASSIUM: 4.1 mmol/L (ref 3.5–5.1)
PROTEIN TOTAL: 6.1 g/dL — ABNORMAL LOW (ref 6.4–8.9)
SODIUM: 139 mmol/L (ref 136–145)

## 2022-07-08 MED ORDER — SUCRALFATE 1 GRAM TABLET
1.0000 g | ORAL_TABLET | Freq: Four times a day (QID) | ORAL | 5 refills | Status: DC
Start: 2022-07-08 — End: 2022-12-22

## 2022-07-08 NOTE — Telephone Encounter (Signed)
Pt called and said the blood happened again this morning, it was dark this time and he feels like he has pressure in his stomach.

## 2022-07-13 ENCOUNTER — Ambulatory Visit (INDEPENDENT_AMBULATORY_CARE_PROVIDER_SITE_OTHER): Payer: Self-pay | Admitting: Surgery

## 2022-07-29 ENCOUNTER — Ambulatory Visit (INDEPENDENT_AMBULATORY_CARE_PROVIDER_SITE_OTHER): Payer: Managed Care, Other (non HMO) | Admitting: Surgery

## 2022-07-29 ENCOUNTER — Encounter (INDEPENDENT_AMBULATORY_CARE_PROVIDER_SITE_OTHER): Payer: Self-pay | Admitting: Surgery

## 2022-07-29 ENCOUNTER — Other Ambulatory Visit (INDEPENDENT_AMBULATORY_CARE_PROVIDER_SITE_OTHER): Payer: Self-pay

## 2022-07-29 ENCOUNTER — Other Ambulatory Visit: Payer: Self-pay

## 2022-07-29 VITALS — BP 115/55 | HR 61 | Ht 68.0 in | Wt 171.0 lb

## 2022-07-29 DIAGNOSIS — K625 Hemorrhage of anus and rectum: Secondary | ICD-10-CM | POA: Insufficient documentation

## 2022-07-29 DIAGNOSIS — Z7901 Long term (current) use of anticoagulants: Secondary | ICD-10-CM

## 2022-07-29 HISTORY — DX: Hemorrhage of anus and rectum: K62.5

## 2022-07-29 NOTE — Progress Notes (Signed)
[facility] [place]  [address]  [place] [address]  Phone: [phone]  Fax: [phone]    Encounter Date: 07/29/2022    Patient ID:  Ricky Grimes  NUU:[id number]    DOB: 08-30-1951  Age: 71 y.o. male    Subjective:     Chief Complaint   Patient presents with    Referral    Blood In  Stool    Rectal Bleeding     Pt ref from Dr. Barnet Pall for dark stool and rectal bleeding been going on for a couple of months      HPI:   Patient claims that he has been constipated and has had some bright red rectal bleeding intermittently for the last couple of months however the last episode was 2 weeks ago.  The bowels are normal color.  He is taking Linzess and still constipated and claimed that his last bowel movement was 3 days ago.  He is eating normally and drinks plenty of water and is very active.  He has a history of colon polyps which was over 10 years ago however last colonoscopy in June of 2022 was completely normal and I checked that it was good study with good preparation only an occasional diverticulum was noted.    Patient claimed that he has had some epigastric distress recently but since he was put on Protonix that has improved.  He has a history of atrial fibrillation and takes Xarelto on long-term basis.    Current Outpatient Medications   Medication Sig    acyclovir (ZOVIRAX) 400 mg Oral Tablet Take 1 Tablet (400 mg total) by mouth Three times a day    Butenafine 1 % Cream Apply topically Topical twice daily    dilTIAZem (CARDIZEM CD) 240 mg Oral Capsule, Sust. Release 24 hr Take 1 Capsule (240 mg total) by mouth Every morning    linaCLOtide (LINZESS) 72 mcg Oral Capsule Take 1 Capsule (72 mcg total) by mouth Every morning    lisinopriL (PRINIVIL) 20 mg Oral Tablet Take 1 Tablet (20 mg total) by mouth Twice daily for 90 days    NALOXONE 4 MG/ACTUATION NASAL SPRAY - ED TO GO 1 Spray by INTRANASAL route Every 2 minutes as needed for actual or suspected opioid overdose. Call 911 if  used.    nitroGLYCERIN (NITROSTAT) 0.4 mg Sublingual Tablet, Sublingual Place 1 Tablet (0.4 mg total) under the tongue Every 5 minutes as needed for Chest pain for 3 doses over 15 minutes    pantoprazole (PROTONIX) 40 mg Oral Tablet, Delayed Release (E.C.) Take 1 Tablet (40 mg total) by mouth Once a day    rimegepant (NURTEC ODT) 75 mg Oral Tablet, Rapid Dissolve Take 1 Tablet (75 mg total) by mouth Once a day    rivaroxaban (XARELTO) 20 mg Oral Tablet Take 1 Tablet (20 mg total) by mouth Every evening with dinner    rosuvastatin (CRESTOR) 10 mg Oral Tablet Take 1 Tablet (10 mg total) by mouth Every evening for 90 days    sucralfate (CARAFATE) 1 gram Oral Tablet Take 1 Tablet (1 g total) by mouth Every 6 hours    tamsulosin (FLOMAX) 0.4 mg Oral Capsule Take 1 Capsule (0.4 mg total) by mouth Every evening after dinner for 90 days     Allergies   Allergen Reactions    Metoclopramide  Other Adverse Reaction (Add comment)     Shakes unable to be still     Past Medical History:   Diagnosis Date  Anticoagulant long-term use     Anxiety     Atrial fibrillation (CMS HCC)     Hypercholesterolemia     Hypertension     Migraine          Past Surgical History:   Procedure Laterality Date    KNEE ARTHROSCOPY Left          Family Medical History:       Problem Relation (Age of Onset)    Atrial fibrillation Father    Cancer Father    No Known Problems Mother, Sister, Brother, Half-Sister, Half-Brother, Maternal 9, Maternal Uncle, Paternal 74, Paternal Uncle, Maternal Grandmother, Maternal Grandfather, Paternal Grandmother, Paternal Grandfather, Daughter, Son            Social History     Tobacco Use    Smoking status: Never    Smokeless tobacco: Never   Vaping Use    Vaping Use: Never used   Substance Use Topics    Alcohol use: Yes    Drug use: Never       ROS:   Patient has no transient ischemic attacks.  No exertional chest pains.  No cough hemoptysis or dyspnea.  No fever chills or night sweats.  No anorexia or weight  loss.  No other abdominal pains or GI complaints.  No claudication or other complaints.    Previous records and operative report as well as all the laboratory studies reviewed from last month where his hemoglobin was 12.9 but CMP iron magnesium TSH B12 vitamin-D were all okay  Objective:   Vitals: BP (!) 115/55 (Site: Left, Patient Position: Sitting, Cuff Size: Adult)   Pulse 61   Ht 1.727 m ('5\' 8"'$ )   Wt 77.6 kg (171 lb)   SpO2 98%   BMI 26.00 kg/m         Physical Exam:  Patient is fully awake and alert fully oriented and in no distress.  Vital signs are stable.    There is no pallor cyanosis or jaundice.  Neck supple without any bruit or neck masses.    Lungs are clear   Heart sounds normal   Abdomen is soft flat nontender without any palpable or pulsatile masses or any hernia.  There is no ankle edema or calf tenderness.  Minimal hemorrhoids noted without any ulceration or thrombosis and there is no other perianal pathology.  Digital examination shows no rectal mass and no blood in the rectum and I did not feel any stool in the rectum either.    Previous records and laboratory studies reviewed as mentioned above.    Assessment & Plan:     ENCOUNTER DIAGNOSES     ICD-10-CM   1. Rectal bleeding  K62.5   2. Chronic anticoagulation  Z79.01   In view of normal colonoscopy with clean good preparation in June of last year, I do not see urgency in repeating colonoscopy at this point.  Most likely patient has some hemorrhoids in the bleeding is related to constipation and the use of Xarelto.    I asked patient to add some MiraLax and I will see him back in 2 months or sooner if needed at which time repeat colonoscopy can be decided.  Patient understands and agrees.    No orders of the defined types were placed in this encounter.      No follow-ups on file.    Gaspar Garbe, MD

## 2022-07-30 ENCOUNTER — Ambulatory Visit (RURAL_HEALTH_CENTER): Payer: Managed Care, Other (non HMO) | Attending: Family | Admitting: Family

## 2022-07-30 ENCOUNTER — Encounter (RURAL_HEALTH_CENTER): Payer: Self-pay | Admitting: Family

## 2022-07-30 VITALS — BP 142/70 | HR 92 | Temp 99.1°F | Resp 18 | Ht 68.0 in | Wt 171.0 lb

## 2022-07-30 DIAGNOSIS — R059 Cough, unspecified: Secondary | ICD-10-CM | POA: Insufficient documentation

## 2022-07-30 DIAGNOSIS — U071 COVID-19: Secondary | ICD-10-CM | POA: Insufficient documentation

## 2022-07-30 DIAGNOSIS — J020 Streptococcal pharyngitis: Secondary | ICD-10-CM | POA: Insufficient documentation

## 2022-07-30 LAB — POCT RAPID STREP A: RAPID STREP A (POCT): POSITIVE

## 2022-07-30 LAB — POCT RAPID COVID (SOFIA) (AMB ONLY): COVID-19 AG: POSITIVE — AB

## 2022-07-30 MED ORDER — DEXTROMETHORPHAN HBR 15 MG/5 ML ORAL SYRUP
10.0000 mL | ORAL_SOLUTION | Freq: Four times a day (QID) | ORAL | 0 refills | Status: AC | PRN
Start: 2022-07-30 — End: 2022-08-06

## 2022-07-30 MED ORDER — GUAIFENESIN ER 600 MG TABLET, EXTENDED RELEASE 12 HR
1200.0000 mg | EXTENDED_RELEASE_TABLET | Freq: Two times a day (BID) | ORAL | Status: AC
Start: 2022-07-30 — End: 2022-08-13

## 2022-07-30 MED ORDER — AMOXICILLIN 875 MG-POTASSIUM CLAVULANATE 125 MG TABLET
1.0000 | ORAL_TABLET | Freq: Two times a day (BID) | ORAL | 0 refills | Status: AC
Start: 2022-07-30 — End: 2022-08-09

## 2022-07-30 NOTE — Patient Instructions (Signed)
Coricidin Products for cold symptoms.  Salt water gargle four times a day.  Discard tooth brush in 24-48 hours  Acetaminophen for fever, chills and body aches.   Healthy diet, plenty of fluids and rest.

## 2022-07-30 NOTE — Progress Notes (Signed)
FAMILY MEDICINE, [place] [id number]  Operated by [facility]     Name: Ricky Grimes MRN:  [id number]   Date of Birth: [birth year] Age: 71 y.o.   Date: 07/30/2022  Time: 10:38     Provider: Illa Level, FNP    Reason for visit: Annual Exam and Cough (Patient has exposure to covid. Complains of cough and runny nose.)      History of Present Illness:  Ricky Grimes is a 71 y.o. male presents to the clinic with coughing and body shakes.The coughing started yesterday- worsening during the night. The body chills start here while at the office.  He is also having a lot of nasal congestion.   He denies any SOB, or Cp.   He tells me that he was exposed by a family member from out of town.   He initially he tells me he would like the paxlovid.     He tells me he does not take his medications consistently and has not take any medications this am.   On review of medications. He is not taking his rosuvastatin, he only takes his rimegepant when he has a headache and he takes it with the Klonopin and goes to bed  He has not taken his tamsulosin in days.  He does take his Xarelto consistently and he take his Klonopin consistently.   These are the medications that he is on that have a high possibility of drug interactions with Paxlovid.       Patient Active Problem List    Diagnosis Date Noted    Rectal bleeding 07/29/2022    Chronic anticoagulation 07/29/2022    S/P rotator cuff repair 07/12/2012       Historical Data    Past Medical History:  Past Medical History:   Diagnosis Date    Anticoagulant long-term use     Anxiety     Atrial fibrillation (CMS HCC)     Hypercholesterolemia     Hypertension     Migraine      Past Surgical History:  Past Surgical History:   Procedure Laterality Date    KNEE ARTHROSCOPY Left      Allergies:  Allergies   Allergen Reactions    Metoclopramide  Other Adverse Reaction (Add comment)     Shakes unable to be  still     Medications:  Current Outpatient Medications   Medication Sig    acyclovir (ZOVIRAX) 400 mg Oral Tablet Take 1 Tablet (400 mg total) by mouth Three times a day    amoxicillin-pot clavulanate (AUGMENTIN) 875-125 mg Oral Tablet Take 1 Tablet by mouth Twice daily for 10 days    baclofen (LIORESAL) 10 mg Oral Tablet Take 1 Tablet (10 mg total) by mouth Twice daily for 90 days    Butenafine 1 % Cream Apply topically Topical twice daily    cholecalciferol, vitamin D3, 100 mcg (4,000 unit) Oral Tablet Take 1 Tablet (4,000 Units total) by mouth Once a day for 90 days    clonazePAM (KLONOPIN) 1 mg Oral Tablet Take 1 Tablet (1 mg total) by mouth Twice daily for 90 days    cyanocobalamin (VITAMIN B12) 1,000 mcg/mL Injection Solution Inject 1 mL (1,000 mcg total) into the muscle Every 30 days for 90 days    Dextromethorphan HBr 15 mg/5 mL Oral Syrup Take 10 mL (30 mg total) by mouth Four times a day as needed for Cough  for up to 7 days    dilTIAZem (CARDIZEM CD) 240 mg Oral Capsule, Sust. Release 24 hr Take 1 Capsule (240 mg total) by mouth Every morning    guaiFENesin (MUCINEX) 600 mg Oral Tablet Extended Release 12hr Take 2 Tablets (1,200 mg total) by mouth Twice daily for 14 days    linaCLOtide (LINZESS) 72 mcg Oral Capsule Take 1 Capsule (72 mcg total) by mouth Every morning    lisinopriL (PRINIVIL) 20 mg Oral Tablet Take 1 Tablet (20 mg total) by mouth Twice daily for 90 days    NALOXONE 4 MG/ACTUATION NASAL SPRAY - ED TO GO 1 Spray by INTRANASAL route Every 2 minutes as needed for actual or suspected opioid overdose. Call 911 if used.    nitroGLYCERIN (NITROSTAT) 0.4 mg Sublingual Tablet, Sublingual Place 1 Tablet (0.4 mg total) under the tongue Every 5 minutes as needed for Chest pain for 3 doses over 15 minutes    pantoprazole (PROTONIX) 40 mg Oral Tablet, Delayed Release (E.C.) Take 1 Tablet (40 mg total) by mouth Once a day    rimegepant (NURTEC ODT) 75 mg Oral Tablet, Rapid Dissolve Take 1 Tablet (75 mg  total) by mouth Once a day    rivaroxaban (XARELTO) 20 mg Oral Tablet Take 1 Tablet (20 mg total) by mouth Every evening with dinner    rosuvastatin (CRESTOR) 10 mg Oral Tablet Take 1 Tablet (10 mg total) by mouth Every evening for 90 days    sucralfate (CARAFATE) 1 gram Oral Tablet Take 1 Tablet (1 g total) by mouth Every 6 hours    tamsulosin (FLOMAX) 0.4 mg Oral Capsule Take 1 Capsule (0.4 mg total) by mouth Every evening after dinner for 90 days     Family History:  Family Medical History:       Problem Relation (Age of Onset)    Atrial fibrillation Father    Cancer Father    No Known Problems Mother, Sister, Brother, Half-Sister, Half-Brother, Maternal Aunt, Maternal Uncle, Paternal 60, Paternal Uncle, Maternal Grandmother, Maternal Grandfather, Paternal Grandmother, Paternal Grandfather, Daughter, Son            Social History:  Social History     Socioeconomic History    Marital status: Widowed    Number of children: 2    Years of education: 12   Occupational History    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   Tobacco Use    Smoking status: Never    Smokeless tobacco: Never   Vaping Use    Vaping Use: Never used   Substance and Sexual Activity    Alcohol use: Yes    Drug use: Never    Sexual activity: Yes     Partners: Female     Birth control/protection: None     Social Determinants of Company secretary Strain: Low Risk     SDOH Financial: No   Transportation Needs: Low Risk     SDOH Transportation: No   Social Connections: Low Risk     SDOH Social Isolation: 5 or more times a week   Intimate Partner Violence: Low Risk     SDOH Domestic Violence: No   Housing Stability: [place] Situation: I have housing.    SDOH Housing Worry: No           Review of Systems:  Any pertinent Review of Systems as addressed in the HPI above.    Physical Exam:  Vital Signs:  Vitals:  07/30/22 0935   BP: (!) 142/70   Pulse: 92   Resp: 18   Temp: 37.3 C (99.1 F)   SpO2: 98%   Weight: 77.6 kg (171 lb)    Height: 1.727 m ('5\' 8"'$ )   BMI: 26.05     Physical Exam  Constitutional:       General: He is not in acute distress (he is verbally jokking throughout the interview and exam.).     Appearance: Normal appearance. He is ill-appearing (he obviously appears to not feel his best.).   HENT:      Head: Normocephalic and atraumatic.      Right Ear: Tympanic membrane, ear canal and external ear normal.      Left Ear: Tympanic membrane, ear canal and external ear normal.      Nose: Mucosal edema, congestion and rhinorrhea present. Rhinorrhea is clear.      Mouth/Throat:      Lips: Pink.      Mouth: Mucous membranes are moist.      Pharynx: Pharyngeal swelling (Worse on the R side than Left.) present.   Eyes:      General: Lids are normal.      Conjunctiva/sclera: Conjunctivae normal.   Cardiovascular:      Rate and Rhythm: Rhythm irregular.      Pulses: Normal pulses.      Heart sounds: S1 normal and S2 normal.   Pulmonary:      Effort: Pulmonary effort is normal.      Breath sounds: Normal breath sounds and air entry. No decreased breath sounds, wheezing, rhonchi or rales.      Comments: Dry sounding cough is noted during the interview and exam.   Abdominal:      Palpations: Abdomen is soft.   Musculoskeletal:      Cervical back: Neck supple. No muscular tenderness.      Right lower leg: No edema.      Left lower leg: No edema.   Lymphadenopathy:      Cervical: No cervical adenopathy.   Skin:     General: Skin is warm.      Capillary Refill: Capillary refill takes less than 2 seconds.      Coloration: Skin is not cyanotic or pale.      Nails: There is no clubbing.   Neurological:      Mental Status: He is alert and oriented to person, place, and time.        Assessment/Plan:  (U07.1) COVID-19  (primary encounter diagnosis)    (J02.0) Strep pharyngitis    (R05.9) Cough, unspecified type  Plan: POCT Rapid Strep, POCT Rapid Covid Threasa Alpha)      Mr Durnil was tested for flu, strep, COVID:  Flu is sent off currently, currently he  is positive for both strep and COVID at today's office visit.       COVID-19: Drug interactions performed on all of Mr Benson [place] medications. Polysubstance is noted.   He was willing to stop his Xarelto, while on the Paxlovid-knowing that this increased his risk for blood clots and possible strokes.  However, he was unwilling to stop his Klonopin in order to take Paxlovid.  Unfortunately he does appear to be increased risk for a power outcome.   We discussed this at length:  Including signs and symptoms that would prompt him to go to the ER for further evaluation.  This included chest pain, shortness of breath, elevated temperature unresponsive to acetaminophen, or dizziness.   I  recommended starting Mucinex now for pneumonia prevention.   I also advised him to use Coricidin products for his current symptoms.  I will also send in a prescription for dextromethorphan for his cough.   All of the information including education on covid was given to him in written form.   I also reiterated the importance of quarantine for the next 5-7 days, wearing his mask when in contact with other individuals, and frequent handwashing.     Strep positive:  I am going to start him on Augmentin twice a day for 10 days.  This would cover any community pneumonia, if that was to develop.   We discussed saltwater gargles, and the importance of discarding his toothbrush in 24-48 hours.  Again written information was given regarding strep.     I have encouraged him to eat a healthy diet, stay good and hydrated and get some rest.    Return if symptoms worsen or fail to improve.    Nandana Krolikowski L Jesusa Stenerson, FNP     Portions of this note may be dictated using voice recognition software or a dictation service. Variances in spelling and vocabulary are possible and unintentional. Not all errors are caught/corrected. Please notify the [facility] if any discrepancies are noted or if the meaning of any statement is not clear.

## 2022-09-19 ENCOUNTER — Other Ambulatory Visit (RURAL_HEALTH_CENTER): Payer: Self-pay | Admitting: Family Medicine

## 2022-09-22 NOTE — Telephone Encounter (Signed)
Dr bowling out of office note to amy to send /br

## 2022-09-22 NOTE — Telephone Encounter (Signed)
Since dr bowling not in office can you please send clonazapam to pharmacy thank you/br

## 2022-09-23 NOTE — Telephone Encounter (Signed)
Patient scheduled.

## 2022-09-24 ENCOUNTER — Ambulatory Visit (RURAL_HEALTH_CENTER): Payer: Self-pay | Admitting: Family

## 2022-09-24 NOTE — Progress Notes (Unsigned)
FAMILY MEDICINE, [place] [id number]  Operated by [facility]     Name: Ricky Grimes MRN:  [id number]   Date of Birth: 18-Jun-1951 Age: 71 y.o.   Date: 09/24/2022  Time: 09:33     Provider: Illa Level, FNP    Reason for visit: No chief complaint on file.      History of Present Illness:  Ricky Grimes is a 71 y.o. male presents to the clinic for a refill on his Clonazepam.   He is a normal patient of Dr Shaune Leeks, and currently she is out of the office.   He tells me he takes Clonazepam for his.     Patient Active Problem List    Diagnosis Date Noted    Rectal bleeding 07/29/2022    Chronic anticoagulation 07/29/2022    S/P rotator cuff repair 07/12/2012       Historical Data    Past Medical History:  Past Medical History:   Diagnosis Date    Anticoagulant long-term use     Anxiety     Atrial fibrillation (CMS HCC)     Hypercholesterolemia     Hypertension     Migraine      Past Surgical History:  Past Surgical History:   Procedure Laterality Date    KNEE ARTHROSCOPY Left      Allergies:  Allergies   Allergen Reactions    Metoclopramide  Other Adverse Reaction (Add comment)     Shakes unable to be still     Medications:  Current Outpatient Medications   Medication Sig    acyclovir (ZOVIRAX) 400 mg Oral Tablet Take 1 Tablet (400 mg total) by mouth Three times a day    baclofen (LIORESAL) 10 mg Oral Tablet Take 1 Tablet (10 mg total) by mouth Twice daily for 90 days    Butenafine 1 % Cream Apply topically Topical twice daily    cholecalciferol, vitamin D3, 100 mcg (4,000 unit) Oral Tablet Take 1 Tablet (4,000 Units total) by mouth Once a day for 90 days    clonazePAM (KLONOPIN) 1 mg Oral Tablet Take 1 Tablet (1 mg total) by mouth Twice daily for 90 days    cyanocobalamin (VITAMIN B12) 1,000 mcg/mL Injection Solution Inject 1 mL (1,000 mcg total) into the muscle Every 30 days for 90 days    dilTIAZem (CARDIZEM CD) 240 mg Oral  Capsule, Sust. Release 24 hr Take 1 Capsule (240 mg total) by mouth Every morning    linaCLOtide (LINZESS) 72 mcg Oral Capsule Take 1 Capsule (72 mcg total) by mouth Every morning    lisinopriL (PRINIVIL) 20 mg Oral Tablet Take 1 Tablet (20 mg total) by mouth Twice daily for 90 days    NALOXONE 4 MG/ACTUATION NASAL SPRAY - ED TO GO 1 Spray by INTRANASAL route Every 2 minutes as needed for actual or suspected opioid overdose. Call 911 if used.    nitroGLYCERIN (NITROSTAT) 0.4 mg Sublingual Tablet, Sublingual Place 1 Tablet (0.4 mg total) under the tongue Every 5 minutes as needed for Chest pain for 3 doses over 15 minutes    pantoprazole (PROTONIX) 40 mg Oral Tablet, Delayed Release (E.C.) Take 1 Tablet (40 mg total) by mouth Once a day    rimegepant (NURTEC ODT) 75 mg Oral Tablet, Rapid Dissolve Take 1 Tablet (75 mg total) by mouth Once a day    rivaroxaban (XARELTO) 20 mg Oral Tablet Take 1 Tablet (20  mg total) by mouth Every evening with dinner    rosuvastatin (CRESTOR) 10 mg Oral Tablet Take 1 Tablet (10 mg total) by mouth Every evening for 90 days (Patient not taking: Reported on 07/30/2022)    sucralfate (CARAFATE) 1 gram Oral Tablet Take 1 Tablet (1 g total) by mouth Every 6 hours    tamsulosin (FLOMAX) 0.4 mg Oral Capsule Take 1 Capsule (0.4 mg total) by mouth Every evening after dinner for 90 days (Patient not taking: Reported on 07/30/2022)     Family History:  Family Medical History:       Problem Relation (Age of Onset)    Atrial fibrillation Father    Cancer Father    No Known Problems Mother, Sister, Brother, Half-Sister, Half-Brother, Maternal Aunt, Maternal Uncle, Paternal Aunt, Paternal Uncle, Maternal Grandmother, Maternal Grandfather, Paternal 11, Paternal Grandfather, Daughter, Son            Social History:  Social History     Socioeconomic History    Marital status: Widowed    Number of children: 2    Years of education: 12   Occupational History    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   Tobacco  Use    Smoking status: Never    Smokeless tobacco: Never   Vaping Use    Vaping Use: Never used   Substance and Sexual Activity    Alcohol use: Yes    Drug use: Never    Sexual activity: Yes     Partners: Female     Birth control/protection: None     Social Determinants of Health     Financial Resource Strain: Low Risk  (07/30/2022)    Financial Resource Strain     SDOH Financial: No   Transportation Needs: Low Risk  (07/30/2022)    Transportation Needs     SDOH Transportation: No   Social Connections: Low Risk  (07/30/2022)    Social Connections     SDOH Social Isolation: 5 or more times a week   Intimate Partner Violence: Low Risk  (07/30/2022)    Intimate Partner Violence     SDOH Domestic Violence: No   Housing Stability: Low Risk  (07/30/2022)    Housing Stability     SDOH Housing Situation: I have housing.     SDOH Housing Worry: No           Review of Systems:  Any pertinent Review of Systems as addressed in the HPI above.    Physical Exam:  Vital Signs:There were no vitals filed for this visit.  Physical Exam     Assessment/Plan:  No diagnosis found.   Problem List Items Addressed This Visit    None     No follow-ups on file.    Emberli Ballester L Girtrude Enslin, FNP     Portions of this note may be dictated using voice recognition software or a dictation service. Variances in spelling and vocabulary are possible and unintentional. Not all errors are caught/corrected. Please notify the [facility] if any discrepancies are noted or if the meaning of any statement is not clear.

## 2022-09-25 ENCOUNTER — Ambulatory Visit: Payer: Managed Care, Other (non HMO) | Attending: Family | Admitting: Family

## 2022-09-25 ENCOUNTER — Ambulatory Visit (RURAL_HEALTH_CENTER): Payer: Managed Care, Other (non HMO) | Attending: Family | Admitting: Family

## 2022-09-25 ENCOUNTER — Other Ambulatory Visit: Payer: Self-pay

## 2022-09-25 ENCOUNTER — Encounter (RURAL_HEALTH_CENTER): Payer: Self-pay | Admitting: Family

## 2022-09-25 VITALS — BP 122/52 | HR 56 | Temp 98.4°F | Resp 18 | Ht 68.0 in | Wt 170.1 lb

## 2022-09-25 DIAGNOSIS — Z125 Encounter for screening for malignant neoplasm of prostate: Secondary | ICD-10-CM | POA: Insufficient documentation

## 2022-09-25 DIAGNOSIS — N401 Enlarged prostate with lower urinary tract symptoms: Secondary | ICD-10-CM | POA: Insufficient documentation

## 2022-09-25 DIAGNOSIS — I4891 Unspecified atrial fibrillation: Secondary | ICD-10-CM | POA: Insufficient documentation

## 2022-09-25 DIAGNOSIS — Z79899 Other long term (current) drug therapy: Secondary | ICD-10-CM | POA: Insufficient documentation

## 2022-09-25 DIAGNOSIS — B351 Tinea unguium: Secondary | ICD-10-CM | POA: Insufficient documentation

## 2022-09-25 DIAGNOSIS — B001 Herpesviral vesicular dermatitis: Secondary | ICD-10-CM | POA: Insufficient documentation

## 2022-09-25 DIAGNOSIS — R350 Frequency of micturition: Secondary | ICD-10-CM | POA: Insufficient documentation

## 2022-09-25 DIAGNOSIS — M542 Cervicalgia: Secondary | ICD-10-CM | POA: Insufficient documentation

## 2022-09-25 DIAGNOSIS — K581 Irritable bowel syndrome with constipation: Secondary | ICD-10-CM | POA: Insufficient documentation

## 2022-09-25 DIAGNOSIS — G479 Sleep disorder, unspecified: Secondary | ICD-10-CM | POA: Insufficient documentation

## 2022-09-25 DIAGNOSIS — G43909 Migraine, unspecified, not intractable, without status migrainosus: Secondary | ICD-10-CM | POA: Insufficient documentation

## 2022-09-25 DIAGNOSIS — I10 Essential (primary) hypertension: Secondary | ICD-10-CM | POA: Insufficient documentation

## 2022-09-25 DIAGNOSIS — K2100 Gastro-esophageal reflux disease with esophagitis, without bleeding: Secondary | ICD-10-CM | POA: Insufficient documentation

## 2022-09-25 DIAGNOSIS — M7631 Iliotibial band syndrome, right leg: Secondary | ICD-10-CM | POA: Insufficient documentation

## 2022-09-25 DIAGNOSIS — K5900 Constipation, unspecified: Secondary | ICD-10-CM | POA: Insufficient documentation

## 2022-09-25 DIAGNOSIS — E559 Vitamin D deficiency, unspecified: Secondary | ICD-10-CM | POA: Insufficient documentation

## 2022-09-25 DIAGNOSIS — E7800 Pure hypercholesterolemia, unspecified: Secondary | ICD-10-CM | POA: Insufficient documentation

## 2022-09-25 DIAGNOSIS — E785 Hyperlipidemia, unspecified: Secondary | ICD-10-CM | POA: Insufficient documentation

## 2022-09-25 DIAGNOSIS — E538 Deficiency of other specified B group vitamins: Secondary | ICD-10-CM | POA: Insufficient documentation

## 2022-09-25 DIAGNOSIS — J309 Allergic rhinitis, unspecified: Secondary | ICD-10-CM | POA: Insufficient documentation

## 2022-09-25 DIAGNOSIS — G8929 Other chronic pain: Secondary | ICD-10-CM | POA: Insufficient documentation

## 2022-09-25 LAB — CBC WITH DIFF
BASOPHIL #: 0 10*3/uL (ref 0.00–0.10)
BASOPHIL %: 1 % (ref 0–1)
EOSINOPHIL #: 0.1 10*3/uL (ref 0.00–0.50)
EOSINOPHIL %: 2 %
HCT: 40.6 % (ref 36.7–47.1)
HGB: 13.5 g/dL (ref 12.5–16.3)
LYMPHOCYTE #: 1.2 10*3/uL (ref 1.00–3.00)
LYMPHOCYTE %: 27 % (ref 16–44)
MCH: 31.5 pg (ref 23.8–33.4)
MCHC: 33.1 g/dL (ref 32.5–36.3)
MCV: 95.1 fL (ref 73.0–96.2)
MONOCYTE #: 0.3 10*3/uL (ref 0.30–1.00)
MONOCYTE %: 6 % (ref 5–13)
MPV: 9.2 fL (ref 7.4–11.4)
NEUTROPHIL #: 2.8 10*3/uL (ref 1.85–7.80)
NEUTROPHIL %: 64 % (ref 43–77)
PLATELETS: 227 10*3/uL (ref 140–440)
RBC: 4.27 10*6/uL (ref 4.06–5.63)
RDW: 13.6 % (ref 12.1–16.2)
WBC: 4.4 10*3/uL (ref 3.6–10.2)

## 2022-09-25 LAB — COMPREHENSIVE METABOLIC PANEL, NON-FASTING
ALBUMIN/GLOBULIN RATIO: 1.8 — ABNORMAL HIGH (ref 0.8–1.4)
ALBUMIN: 4.4 g/dL (ref 3.5–5.7)
ALKALINE PHOSPHATASE: 40 U/L (ref 34–104)
ALT (SGPT): 16 U/L (ref 7–52)
ANION GAP: 6 mmol/L (ref 4–13)
AST (SGOT): 18 U/L (ref 13–39)
BILIRUBIN TOTAL: 0.6 mg/dL (ref 0.3–1.2)
BUN/CREA RATIO: 19 (ref 6–22)
BUN: 20 mg/dL (ref 7–25)
CALCIUM, CORRECTED: 9.1 mg/dL (ref 8.9–10.8)
CALCIUM: 9.5 mg/dL (ref 8.6–10.3)
CHLORIDE: 109 mmol/L — ABNORMAL HIGH (ref 98–107)
CO2 TOTAL: 26 mmol/L (ref 21–31)
CREATININE: 1.06 mg/dL (ref 0.60–1.30)
ESTIMATED GFR: 75 mL/min/{1.73_m2} (ref >59)
GLOBULIN: 2.4 — ABNORMAL LOW (ref 2.9–5.4)
GLUCOSE: 103 mg/dL (ref 74–109)
OSMOLALITY, CALCULATED: 284 mOsm/kg (ref 270–290)
POTASSIUM: 4.4 mmol/L (ref 3.5–5.1)
PROTEIN TOTAL: 6.8 g/dL (ref 6.4–8.9)
SODIUM: 141 mmol/L (ref 136–145)

## 2022-09-25 LAB — LIPID PANEL
CHOL/HDL RATIO: 3.6
CHOLESTEROL: 199 mg/dL (ref <200)
HDL CHOL: 56 mg/dL (ref 23–92)
LDL CALC: 116 mg/dL — ABNORMAL HIGH (ref 0–100)
TRIGLYCERIDES: 137 mg/dL (ref <=150)
VLDL CALC: 27 mg/dL (ref 0–50)

## 2022-09-25 LAB — URINE DRUG SCREEN
AMPHET QL: NEGATIVE
BARB QL: NEGATIVE
BENZO QL: NEGATIVE
BUP QL: NEGATIVE
CANNAQL: NEGATIVE
COCQL: NEGATIVE
FENTANYL, RANDOM URINE: NEGATIVE
OPIATE: NEGATIVE
OXYCODONE URINE: NEGATIVE
PCP QL: NEGATIVE

## 2022-09-25 LAB — VITAMIN D 25 TOTAL: VITAMIN D: 41 ng/mL (ref 30–100)

## 2022-09-25 LAB — VITAMIN B12: VITAMIN B 12: 329 pg/mL (ref 180–914)

## 2022-09-25 LAB — PSA SCREENING: PSA: 2.44 ng/mL (ref <=4.00)

## 2022-09-25 MED ORDER — NURTEC ODT 75 MG DISINTEGRATING TABLET
1.0000 | ORAL_TABLET | Freq: Every day | ORAL | Status: DC | PRN
Start: 2022-09-25 — End: 2022-12-22

## 2022-09-25 MED ORDER — LISINOPRIL 20 MG TABLET
20.0000 mg | ORAL_TABLET | Freq: Every day | ORAL | Status: DC
Start: 2022-09-25 — End: 2022-12-22

## 2022-09-25 MED ORDER — RIVAROXABAN 20 MG TABLET
20.0000 mg | ORAL_TABLET | Freq: Every day | ORAL | Status: DC
Start: 2022-09-25 — End: 2022-12-22

## 2022-09-25 MED ORDER — ACYCLOVIR 400 MG TABLET
400.0000 mg | ORAL_TABLET | Freq: Three times a day (TID) | ORAL | Status: AC
Start: 2022-09-25 — End: 2022-10-05

## 2022-09-25 MED ORDER — FLUTICASONE PROPIONATE 50 MCG/ACTUATION NASAL SPRAY,SUSPENSION
1.0000 | Freq: Every day | NASAL | 1 refills | Status: AC
Start: 2022-09-25 — End: 2022-12-24

## 2022-09-25 MED ORDER — LINACLOTIDE 72 MCG CAPSULE
72.0000 ug | ORAL_CAPSULE | Freq: Every morning | ORAL | 1 refills | Status: DC
Start: 2022-09-25 — End: 2022-12-22

## 2022-09-25 MED ORDER — PANTOPRAZOLE 40 MG TABLET,DELAYED RELEASE
40.0000 mg | DELAYED_RELEASE_TABLET | Freq: Every day | ORAL | 1 refills | Status: DC
Start: 2022-09-25 — End: 2022-12-22

## 2022-09-25 MED ORDER — CLONAZEPAM 1 MG TABLET
1.0000 mg | ORAL_TABLET | Freq: Every evening | ORAL | 0 refills | Status: DC | PRN
Start: 2022-09-25 — End: 2022-12-22

## 2022-09-25 NOTE — Assessment & Plan Note (Signed)
Pain agreement is noted to be on file.  Will get a urine drug screen today.

## 2022-09-25 NOTE — Progress Notes (Signed)
FAMILY MEDICINE, [place]  [place] [address]  Operated by [facility]     Name: Ricky Grimes MRN:  [id number]   Date of Birth: 1951-05-18 Age: 71 y.o.   Date: 09/25/2022  Time: 14:04     Provider: Illa Level, FNP    Reason for visit: Medication Question (Medication refills. )      History of Present Illness:  Ricky Grimes is a 71 y.o. male presents to the clinic for medications refills.   He also wants me to look at his big toe, his s.o. is concerned that he has a toenail infection.   He is a primary patient of Dr Barnet Pall. She is currently out of the office and he needs a refill on his clonazepam.   He tells me he only see Dr Barnet Pall "about every 6 months"     I am going to do a chronic disease management appt today.  His last appt was in May 2023    Sleeping Difficulties: He uses his Clozampam to help him go to sleep. He tells me his mind just "wont cut off" He use it nightly. He does admit that occasionally he will take one during the day for increased episode of anxiety.     HTN: he consistently takes his BP medication. He does not eat use much salt in his diet.  He denies any issue with C.P, SOB or edema.     Chronic Constipation: He tells me this is managed with Linzess. He eats a diet high in fiber.   He tells me he had his colonoscopy recently.  05-19-21    GERD: He tells me he continues to have intermittent issues with his stomach. He has been taking Carafate and that helps. He has seen Dr Lucio Edward in the past for the issues, but he has not had a EGD. He is scheduled to see Dr Lucio Edward in November 15th.  He takes the pantoprazole daily.     Hyperlipidemia: He takes his cholesterol medication nightly. He tells me the last time he had his cholesterol checked he had stopped his medication. Since that time he has restarted the medication because it was "up some"  He does watch the fat in his diet. He denies any strokes in the past. He  denies any recent issues with numbness, tingling or issues with slurred speech. He does have a history of chronic Afib.     Migrainies: he tells me the nurtec helps "some. But it too expensive to take daily. He only uses it as needed.     Fever Blister: he uses Acyclovir as needed.     History of Afib: He tells me his take his Diltiazem and Xarelto daily. Denies any recent issues with Afib, Chest discomfort, or dizziness.Marland Kitchen He also denies any rectal bleeding recently. He sees a Film/video editor. He can not remember his name.     Chronic neck pain: he uses bacolfen as needed. He considering seeing a chiropractor in the near future.     Vitamin D deficiency He continues to take this supplement but is unsure how much he taking.   Vitamin B deficiency He no longer on the injection. He is taking an oral supplement.     BPH: He takes Tamsulosin, but he continues to urinate to often.  He tells me any thing he drinks makes him urinate. He does not check his PSA.      He is  having a lot of issue with pain in the side of his upper R leg. He describes it as a muscle that spasms and then he has a hard time ambulating. He tells me he can push on the knot on the side of the leg and it will immediately loosen and he can resume walking. However, by the evening the leg and hip is painful.  He sees ortho for cortisone injections.    The Big toe nail on the L foot that has changed colors and he would like to have that looked at.     Refills that he needs i today are clonazepam, Flonase, and Linzess.      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    Sleeping difficulties 09/25/2022    Migraines 09/25/2022    Fever blister 09/25/2022    Neck pain, chronic 09/25/2022    Chronic anticoagulation 07/29/2022       Historical Data    Past Medical History:  Past Medical History:   Diagnosis Date    Anticoagulant long-term use     Anxiety     Atrial fibrillation (CMS HCC)     Hypercholesterolemia     Hypertension     Migraine     Rectal bleeding     S/P rotator cuff repair      Past Surgical History:  Past Surgical History:   Procedure Laterality Date    KNEE ARTHROSCOPY Left      Allergies:  Allergies   Allergen Reactions    Metoclopramide  Other Adverse Reaction (Add comment)     Shakes unable to be still     Medications:  Current Outpatient Medications   Medication Sig    acyclovir (ZOVIRAX) 400 mg Oral Tablet Take 1 Tablet (400 mg total) by mouth Three times a day for 10 days    baclofen (LIORESAL) 10 mg Oral Tablet Take 1 Tablet (10 mg total) by mouth Twice daily for 90 days    Butenafine 1 % Cream Apply topically Topical twice daily    cholecalciferol, vitamin D3, 100 mcg (4,000 unit) Oral Tablet Take 1 Tablet (4,000 Units total) by mouth Once a day for 90 days    clonazePAM (KLONOPIN) 1 mg Oral Tablet Take 1 Tablet (1 mg total) by mouth Every night as needed (and occassionally for panic attacks.) Indications: Anxiety and sleep aid.    dilTIAZem (CARDIZEM CD) 240 mg Oral Capsule, Sust. Release 24 hr Take 1 Capsule (240 mg total) by mouth Every morning    fluticasone propionate (FLONASE) 50 mcg/actuation Nasal Spray, Suspension Administer 1 Spray into each nostril Once a day for 90 days Indications: inflammation of the nose due to an allergy    linaCLOtide (LINZESS) 72 mcg Oral Capsule Take 1 Capsule (72 mcg total) by mouth Every morning for 180 days    lisinopriL (PRINIVIL) 20 mg Oral Tablet Take 1 Tablet (20 mg total) by mouth Once a day for 90 days  NALOXONE 4  MG/ACTUATION NASAL SPRAY - ED TO GO 1 Spray by INTRANASAL route Every 2 minutes as needed for actual or suspected opioid overdose. Call 911 if used.    nitroGLYCERIN (NITROSTAT) 0.4 mg Sublingual Tablet, Sublingual Place 1 Tablet (0.4 mg total) under the tongue Every 5 minutes as needed for Chest pain for 3 doses over 15 minutes    pantoprazole (PROTONIX) 40 mg Oral Tablet, Delayed Release (E.C.) Take 1 Tablet (40 mg total) by mouth Once a day    rimegepant (NURTEC ODT) 75 mg Oral Tablet, Rapid Dissolve Take 1 Tablet (75 mg total) by mouth Once per day as needed for Other (Migraines)    rivaroxaban (XARELTO) 20 mg Oral Tablet Take 1 Tablet (20 mg total) by mouth Once a day for 90 days Indications: treatment to prevent blood clots in chronic atrial fibrillation    rosuvastatin (CRESTOR) 10 mg Oral Tablet Take 1 Tablet (10 mg total) by mouth Every evening for 90 days    sucralfate (CARAFATE) 1 gram Oral Tablet Take 1 Tablet (1 g total) by mouth Every 6 hours    tamsulosin (FLOMAX) 0.4 mg Oral Capsule Take 1 Capsule (0.4 mg total) by mouth Every evening after dinner for 90 days     Family History:  Family Medical History:       Problem Relation (Age of Onset)    Atrial fibrillation Father    Cancer Father    No Known Problems Mother, Sister, Brother, Half-Sister, Half-Brother, Maternal Aunt, Maternal Uncle, Paternal 59, Paternal Uncle, Maternal Grandmother, Maternal Grandfather, Paternal Grandmother, Paternal Grandfather, Daughter, Son            Social History:  Social History     Socioeconomic History    Marital status: Widowed    Number of children: 2    Years of education: 12   Occupational History    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   Tobacco Use    Smoking status: Never    Smokeless tobacco: Never   Vaping Use    Vaping Use: Never used   Substance and Sexual Activity    Alcohol use: Yes    Drug use: Never    Sexual activity: Yes     Partners: Female     Birth control/protection: None     Social Determinants of  Health     Financial Resource Strain: Low Risk  (07/30/2022)    Financial Resource Strain     SDOH Financial: No   Transportation Needs: Low Risk  (07/30/2022)    Transportation Needs     SDOH Transportation: No   Social Connections: Low Risk  (07/30/2022)    Social Connections     SDOH Social Isolation: 5 or more times a week   Intimate Partner Violence: Low Risk  (07/30/2022)    Intimate Partner Violence     SDOH Domestic Violence: No   Housing Stability: Low Risk  (07/30/2022)    Housing Stability     SDOH Housing Situation: I have housing.     SDOH Housing Worry: No           Review of Systems:  Any pertinent Review of Systems as addressed in the HPI above.    Physical Exam:  Vital Signs:  Vitals:    09/25/22 0919   BP: (!) 122/52   Pulse: 56   Resp: 18   Temp: 36.9 C (98.4 F)   TempSrc: Temporal   SpO2: 98%   Weight: 77.2 kg (170 lb 2 oz)  Height: 1.727 m ('5\' 8"'$ )   BMI: 25.92     Physical Exam  Constitutional:       General: He is not in acute distress.     Appearance: Normal appearance. He is not ill-appearing.   HENT:      Head: Normocephalic and atraumatic.      Nose: Nose normal.      Mouth/Throat:      Mouth: Mucous membranes are moist.   Eyes:      General: No scleral icterus.     Conjunctiva/sclera: Conjunctivae normal.   Neck:      Thyroid: No thyroid mass, thyromegaly or thyroid tenderness.      Vascular: No carotid bruit.      Trachea: Trachea normal.   Cardiovascular:      Rate and Rhythm: Normal rate.      Pulses: Normal pulses.      Heart sounds: S1 normal and S2 normal. Murmur (Grade 3) heard.   Pulmonary:      Effort: Pulmonary effort is normal.      Breath sounds: Normal breath sounds and air entry. No wheezing, rhonchi or rales.   Abdominal:      General: Bowel sounds are normal.      Palpations: Abdomen is soft.      Tenderness: There is no abdominal tenderness. There is no right CVA tenderness or left CVA tenderness.   Musculoskeletal:      Cervical back: Normal and neck supple.      Thoracic  back: Normal.      Lumbar back: Normal.      Right lower leg: Tenderness present. No edema.      Left lower leg: No edema.        Legs:    Lymphadenopathy:      Cervical: No cervical adenopathy.   Skin:     General: Skin is warm.      Coloration: Skin is not cyanotic or pale.      Nails: There is no clubbing.   Neurological:      Mental Status: He is alert and oriented to person, place, and time. Mental status is at baseline.   Psychiatric:         Attention and Perception: Attention normal.         Mood and Affect: Mood and affect normal.         Speech: Speech normal.         Behavior: Behavior normal. Behavior is cooperative.          Assessment/Plan:  (E78.5) Hyperlipidemia, unspecified hyperlipidemia type  (primary encounter diagnosis)  Plan: LIPID PANEL    (I10) Hypertension, unspecified type  Plan: CBC/DIFF, COMPREHENSIVE METABOLIC PANEL,         NON-FASTING    (K59.00) Constipation, unspecified constipation type    (E55.9) Vitamin D deficiency  Plan: VITAMIN D 25 TOTAL    (E53.8) Vitamin B 12 deficiency  Plan: VITAMIN B12    (N40.1,  R35.0) Benign prostatic hyperplasia with urinary frequency  Plan: PSA SCREENING    (J30.9) Allergic rhinitis    (K21.00) GERD with esophagitis    (I48.91) A-fib (CMS HCC)    (B35.1) Onychomycosis  Plan: Referral to External Provider (AMB)    (M76.31) It band syndrome, right    (Z12.5) Prostate cancer screening  Plan: PSA SCREENING    (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  We will recheck a lipid level today.  Continue rosuvastatin.  Educated on the importance of taking his statin consistently despite his lab results.  This is a preventive  medication, and he has increased risk factors for stroke.          Relevant Orders    LIPID PANEL (Completed)    Hypertension     Well controlled.  Will check a CMP today.  Appears he is taking his lisinopril little different than ordered.  He is taking his antihypertensive daily.  He will continue to take his antihypertensive, lisinopril daily.         Relevant Orders    CBC/DIFF (Completed)    COMPREHENSIVE METABOLIC PANEL, NON-FASTING (Completed)    A-fib (CMS HCC)     Stable: Auscultation noted a regular heart rate.  I believe he most likely sees Dr Geraldo Pitter.  But, I was unable to read any notes in the old system.  I will request clinical records.  He will continue to take his diltiazem and his Xarelto.  Check a CBC today            Neurologic    Sleeping difficulties     We will continue his clonazepam, as this works well for him and he appears to be tolerating it without any issue.          Migraines     Continue Nurtec p.r.n..         Neck pain, chronic     Currently he is taking baclofen for muscle spasms, and that appears to be well working well when needed.  Continue skeletal muscle relaxant, baclofen.  I encouraged him to see a chiropractor as desire.            Digestive    Constipation     Controlled, continue Linzess.  Patient wanted some samples, however I did not have any today to give him.  I have suggested that he call back to the clinic in the near future and maybe we can supply him with some.          GERD with esophagitis     Most likely is experiencing some esophagitis or gastritis.  We discussed the importance of taking his pantoprazole consistently.  Avoiding all NSAID products.   Eating smaller meals, and less foods that produce acid.   He is a high risk for GI bleed due to the Eliquis.  He is scheduled to follow up with General surgery, Dr. Lucio Edward on November 15th.  I suggested that he speak with Dr. Lucio Edward at that time regarding the need for an upper endoscopy.  Will check a CBC today            Fever blister     Controlled with p.r.n. acyclovir.  He has acyclovir at home, we discussed the proper way to take this if a fever blister was to occur.             Endocrine    Vitamin D deficiency     Normal on 04/02/2022.  Recheck vitamin-D levels today.  Continue his current supplement.  Relevant Orders    VITAMIN D 25 TOTAL (Completed)    Vitamin B 12 deficiency     Normal on 04/02/2022.  Recheck a vitamin B12 level today, continue his current supplement.           Relevant Orders    VITAMIN B12 (Completed)       Musculoskeletal    It band syndrome, right     I believe he is experiencing muscle spasms in his IT band.  We discussed ways to improve this discomfort, massage, using a massage ball, attending physical therapy for stretching and strengthening, and buying a tens unit and applying over the area.  Again, I encouraged him to see a chiropractor.  He is also scheduled to see orthopedic in the near future for a hip injection.  That also may help with this discomfort.  Otherwise, he can manually massage the area, as he is already doing so.  Use acetaminophen for pain and discomfort.   Written information given on IT band syndrome.             Ear, Nose, and Throat    Allergic rhinitis     Refill his fluticasone.  Continue to use as needed throughout the allergy season            Dermatology    Onychomycosis     We discussed the use of oral Lamisil, including risk factors and frequent monitoring of lab work.  I have suggested a podiatry referral for confirmation before starting this type of medication.  He has been trying to use a topical, with very little success.  Referral for Podiatry place.         Relevant Orders    Referral to External Provider (AMB)       Other    Benign prostatic hyperplasia with urinary frequency     Most likely partially controlled with tamsulosin.  He has agreed to a PSA test.  If his PSA is elevated we will send him to Urology for evaluation.  Continue his current  micturition agent, tamsulosin.           Relevant Orders    PSA SCREENING (Completed)    Prostate cancer screening    Relevant Orders    PSA SCREENING (Completed)    Chronic prescription benzodiazepine use     Pain agreement is noted to be on file.  Will get a urine drug screen today.         Relevant Orders    URINE DRUG SCREEN (Completed)         Return in about 6 months (around 03/27/2023) for Chronic Disease Management.  On the day of the encounter, a total of  55  minutes was spent on this patient encounter including review of historical information, examination, documentation and post-visit activities. The time documented excludes procedural time.   Elsa Ploch L Navdeep Halt, FNP     Portions of this note may be dictated using voice recognition software or a dictation service. Variances in spelling and vocabulary are possible and unintentional. Not all errors are caught/corrected. Please notify the [facility] if any discrepancies are noted or if the meaning of any statement is not clear.

## 2022-09-25 NOTE — Nursing Note (Signed)
Patient here today for medication refills.

## 2022-09-25 NOTE — Assessment & Plan Note (Signed)
Currently he is taking baclofen for muscle spasms, and that appears to be well working well when needed.  Continue skeletal muscle relaxant, baclofen.  I encouraged him to see a chiropractor as desire.

## 2022-09-25 NOTE — Assessment & Plan Note (Signed)
Most likely partially controlled with tamsulosin.  He has agreed to a PSA test.  If his PSA is elevated we will send him to Urology for evaluation.  Continue his current micturition agent, tamsulosin.

## 2022-09-25 NOTE — Assessment & Plan Note (Signed)
Continue Nurtec p.r.n..

## 2022-09-25 NOTE — Assessment & Plan Note (Signed)
Well controlled.  Will check a CMP today.  Appears he is taking his lisinopril little different than ordered.  He is taking his antihypertensive daily.  He will continue to take his antihypertensive, lisinopril daily.

## 2022-09-25 NOTE — Assessment & Plan Note (Signed)
We discussed the use of oral Lamisil, including risk factors and frequent monitoring of lab work.  I have suggested a podiatry referral for confirmation before starting this type of medication.  He has been trying to use a topical, with very little success.  Referral for Podiatry place.

## 2022-09-25 NOTE — Assessment & Plan Note (Signed)
We will continue his clonazepam, as this works well for him and he appears to be tolerating it without any issue.

## 2022-09-25 NOTE — Assessment & Plan Note (Signed)
Most likely is experiencing some esophagitis or gastritis.  We discussed the importance of taking his pantoprazole consistently.  Avoiding all NSAID products.   Eating smaller meals, and less foods that produce acid.   He is a high risk for GI bleed due to the Eliquis.  He is scheduled to follow up with General surgery, Dr. Lucio Edward on November 15th.  I suggested that he speak with Dr. Lucio Edward at that time regarding the need for an upper endoscopy.  Will check a CBC today

## 2022-09-25 NOTE — Assessment & Plan Note (Signed)
Normal on 04/02/2022.  Recheck a vitamin B12 level today, continue his current supplement.

## 2022-09-25 NOTE — Assessment & Plan Note (Signed)
Stable: Auscultation noted a regular heart rate.  I believe he most likely sees Dr Geraldo Pitter.  But, I was unable to read any notes in the old system.  I will request clinical records.  He will continue to take his diltiazem and his Xarelto.  Check a CBC today

## 2022-09-25 NOTE — Assessment & Plan Note (Signed)
Refill his fluticasone.  Continue to use as needed throughout the allergy season

## 2022-09-25 NOTE — Assessment & Plan Note (Signed)
Last lipid level was on 04/02/2022, at that time his triglycerides were elevated.  We will recheck a lipid level today.  Continue rosuvastatin.  Educated on the importance of taking his statin consistently despite his lab results.  This is a preventive medication, and he has increased risk factors for stroke.

## 2022-09-25 NOTE — Assessment & Plan Note (Signed)
Normal on 04/02/2022.  Recheck vitamin-D levels today.  Continue his current supplement.

## 2022-09-25 NOTE — Assessment & Plan Note (Signed)
Controlled, continue Linzess.  Patient wanted some samples, however I did not have any today to give him.  I have suggested that he call back to the clinic in the near future and maybe we can supply him with some.

## 2022-09-25 NOTE — Assessment & Plan Note (Signed)
I believe he is experiencing muscle spasms in his IT band.  We discussed ways to improve this discomfort, massage, using a massage ball, attending physical therapy for stretching and strengthening, and buying a tens unit and applying over the area.  Again, I encouraged him to see a chiropractor.  He is also scheduled to see orthopedic in the near future for a hip injection.  That also may help with this discomfort.  Otherwise, he can manually massage the area, as he is already doing so.  Use acetaminophen for pain and discomfort.   Written information given on IT band syndrome.

## 2022-09-25 NOTE — Assessment & Plan Note (Signed)
Controlled with p.r.n. acyclovir.  He has acyclovir at home, we discussed the proper way to take this if a fever blister was to occur.

## 2022-09-26 NOTE — Result Encounter Note (Signed)
[name] we will you give Ricky Grimes a call regarding his lab.  Comprehensive metabolic panel:  Normal no indications of electrolyte imbalances .  Nor is there any indication liver or kidney dysfunction.  Cholesterol panel:  Is improving.  I recommend that he stay on that statin therapy, and take it consistently.   Vitamin-D level:  Is normal  Vitamin B12:  Is normal, but on the lower side of normal.  Since he is not taking his vitamin B12 injections anymore, I would recommend that he resume a oral supplement.  He can take B12 a 1000 mcg daily.  PSA screen:  That text his prostate health is normal.  Continue to monitor that yearly.   CBC:  Normal no indications of anemia, infection, inflammation.     If he has any questions let me know:  Otherwise, I will see him in April at his routine scheduled appointment.

## 2022-09-28 ENCOUNTER — Encounter (RURAL_HEALTH_CENTER): Payer: Self-pay | Admitting: Family

## 2022-09-30 ENCOUNTER — Telehealth (RURAL_HEALTH_CENTER): Payer: Self-pay | Admitting: Family

## 2022-09-30 NOTE — Telephone Encounter (Signed)
-----   Message from Illa Level, FNP sent at 09/26/2022  4:42 PM EDT -----  Delana Meyer we will you give Mr.Steffler a call regarding his lab.  Comprehensive metabolic panel:  Normal no indications of electrolyte imbalances .  Nor is there any indication liver or kidney dysfunction.  Cholesterol panel:  Is improving.  I recommend that he stay on that statin therapy, and take it consistently.   Vitamin-D level:  Is normal  Vitamin B12:  Is normal, but on the lower side of normal.  Since he is not taking his vitamin B12 injections anymore, I would recommend that he resume a oral supplement.  He can take B12 a 1000 mcg daily.  PSA screen:  That text his prostate health is normal.  Continue to monitor that yearly.   CBC:  Normal no indications of anemia, infection, inflammation.     If he has any questions let me know:  Otherwise, I will see him in April at his routine scheduled appointment.

## 2022-10-09 NOTE — Result Encounter Note (Signed)
Ricky Dacosta, MA  Adonys Wildes, [employer], FNP  Called and informed patient about lab results and medication. Patient state that is he has any questions about he would give [place] a call.

## 2022-10-14 ENCOUNTER — Encounter (INDEPENDENT_AMBULATORY_CARE_PROVIDER_SITE_OTHER): Payer: Self-pay | Admitting: Surgery

## 2022-11-12 IMAGING — MR MRI HIP RT W/O CONTRAST
5 of 7 series · 24 of 40 positions shown · IV contrast (gadolinium)
Comparison: None previous available.

﻿EXAM:  00065   MRI HIP RT W/O CONTRAST
INDICATION: 70-year-old male with severe right hip pain and right leg pain. History of trochanteric bursitis. No history of trauma or hip surgery.
TECHNIQUE: Multiplanar, multisequential MRI of the right hip was performed without gadolinium contrast.

[Series 7: T2 fat-sat · axial · right · 4.5mm · 0.94mm/px · z∈[-61,+84]mm · 6 of 30 slices shown (1 of 3)]
[im 1/30]
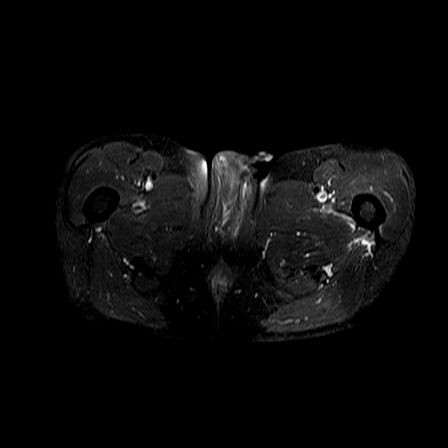
[im 6/30]
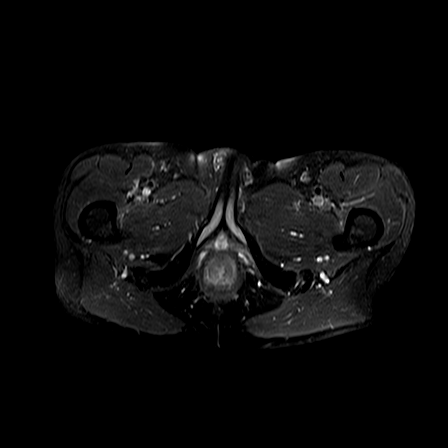
[im 12/30]
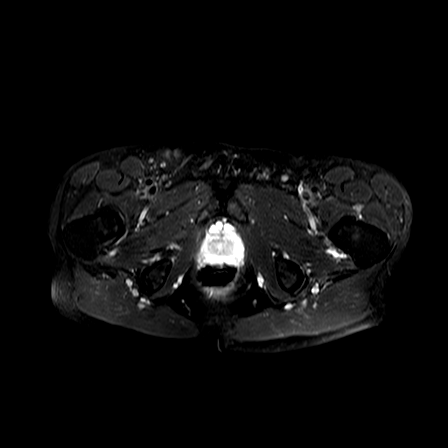
[im 18/30]
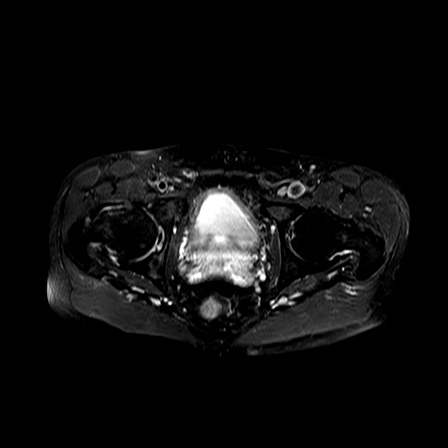
[im 24/30]
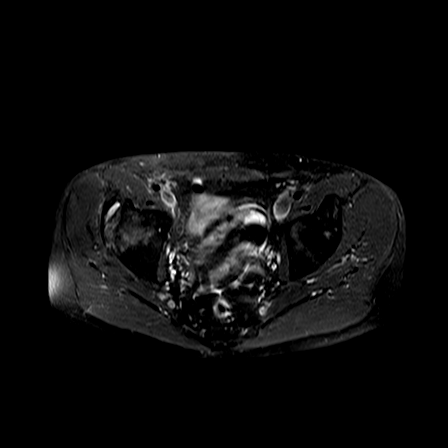
[im 30/30]
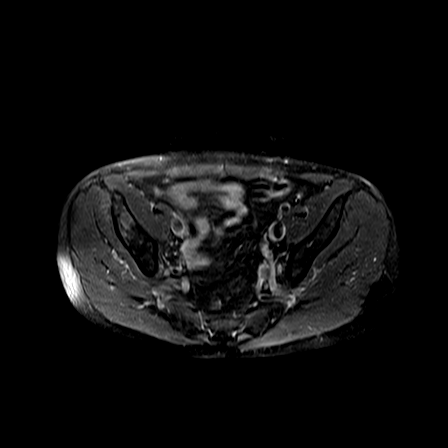

[Series 8: T1 · axial · right · 4.5mm · 0.82mm/px · z∈[-61,+84]mm · 7 of 30 slices shown (1 of 2)]
[im 1/30]
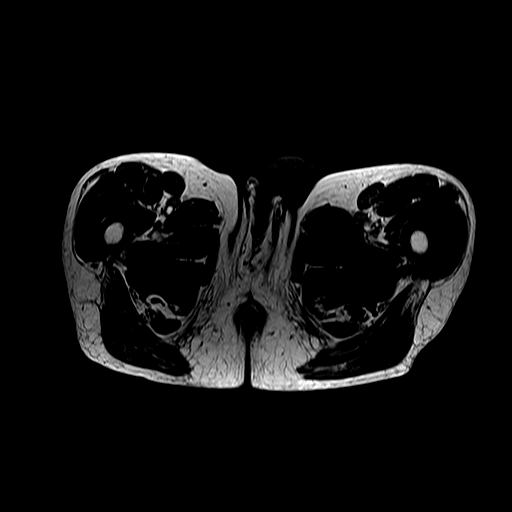
[im 5/30]
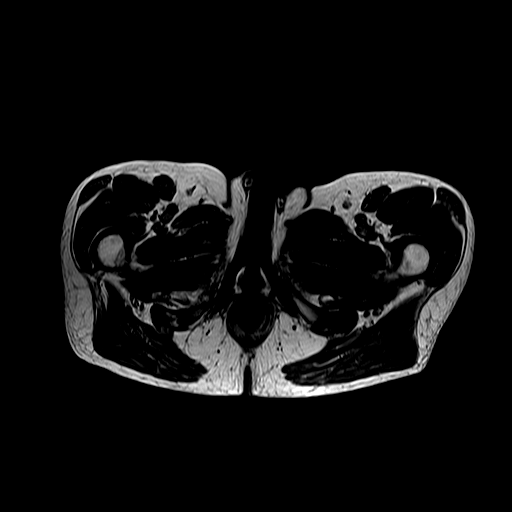
[im 10/30]
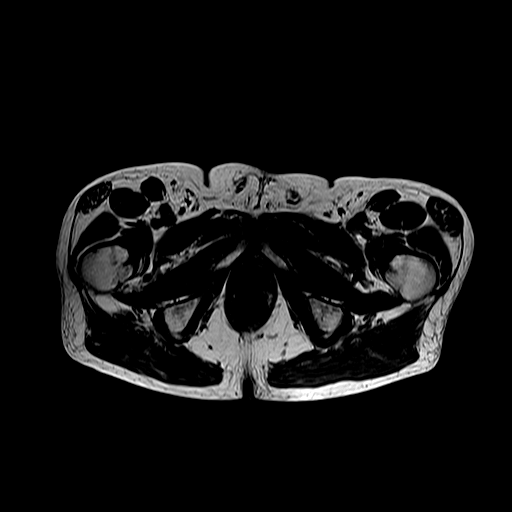
[im 15/30]
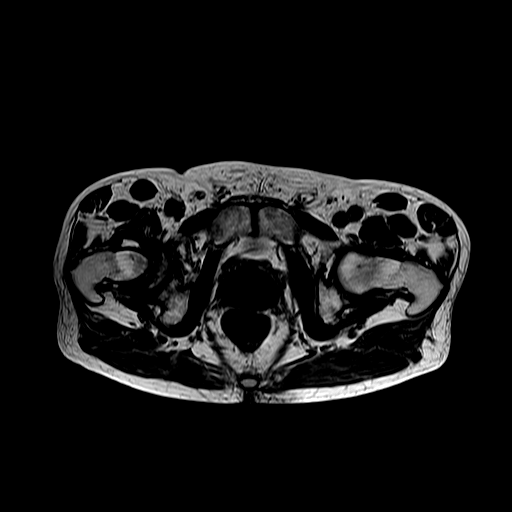
[im 20/30]
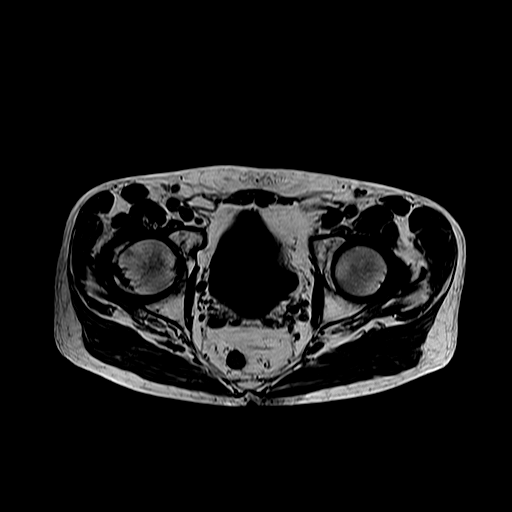
[im 25/30]
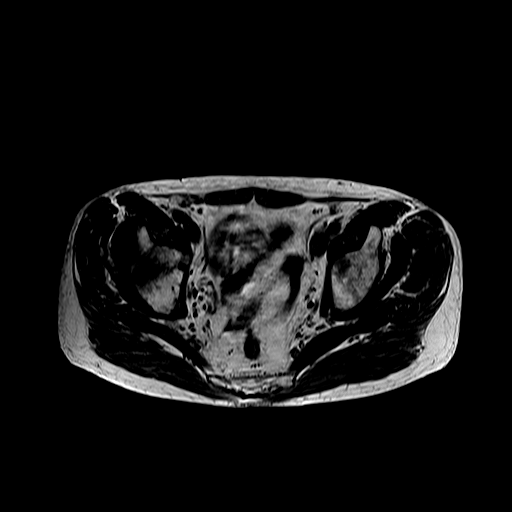
[im 30/30]
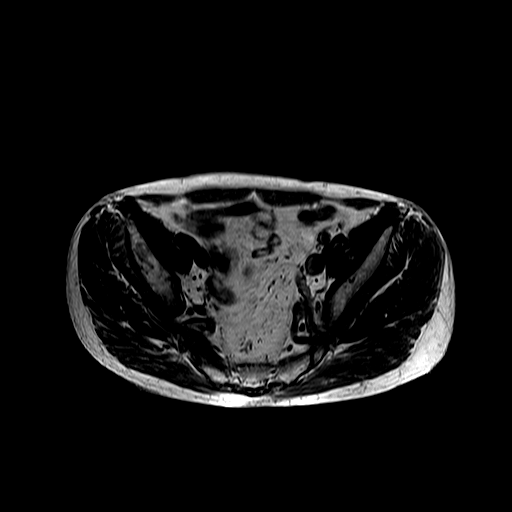

[Series 10: T1 · coronal · right · 4.0mm · 0.82mm/px · 1 of 20 slices shown (2 of 2)]
[im 1/20]
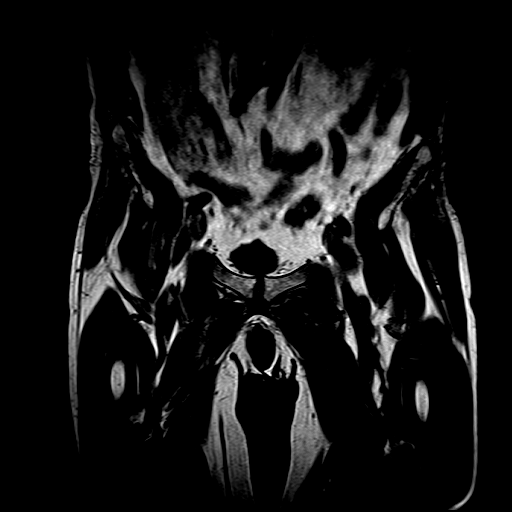

[Series 11: T2 fat-sat · coronal · right · 4.0mm · 0.82mm/px · 5 of 20 slices shown (2 of 3)]
[im 1/20]
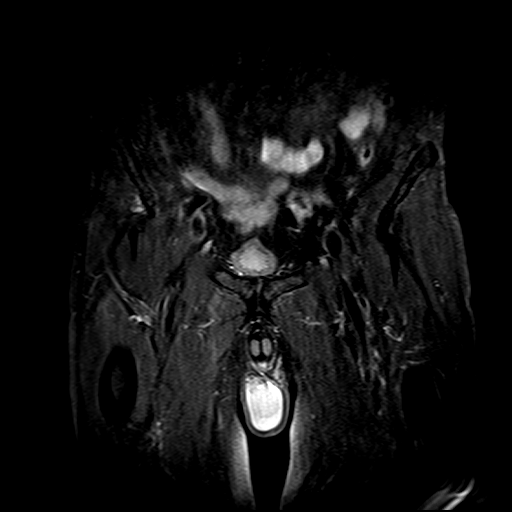
[im 5/20]
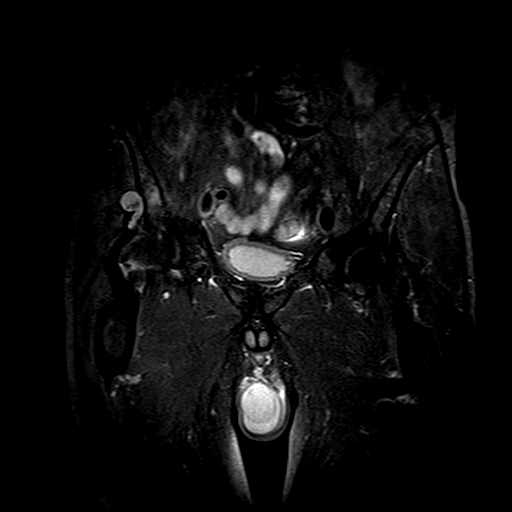
[im 10/20]
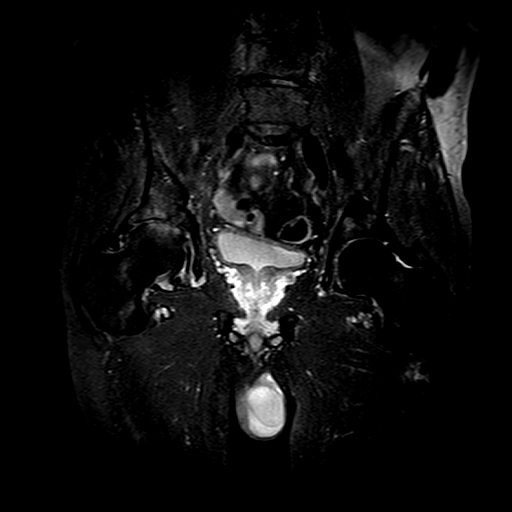
[im 15/20]
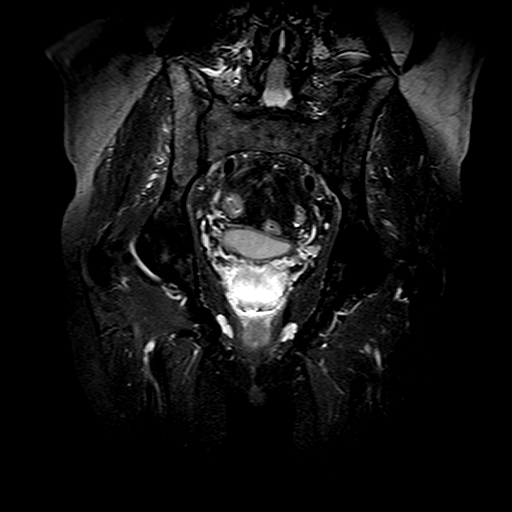
[im 20/20]
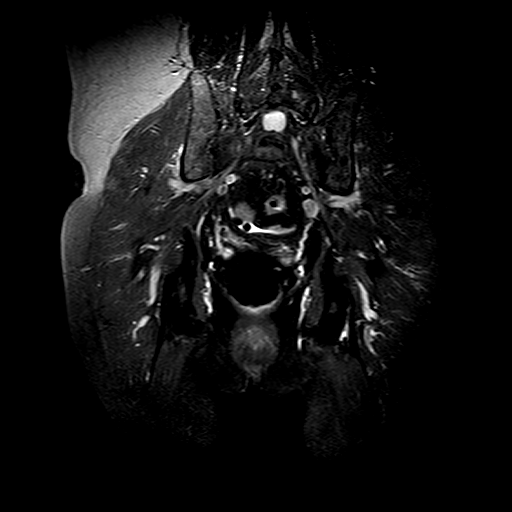

[Series 13: T2 fat-sat · sagittal · right · 4.5mm · 0.78mm/px · 5 of 20 slices shown (3 of 3)]
[im 1/20]
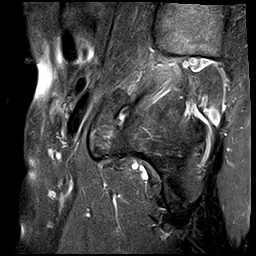
[im 5/20]
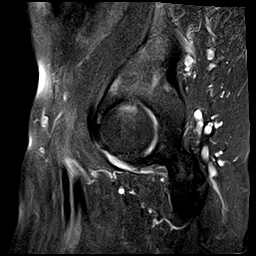
[im 10/20]
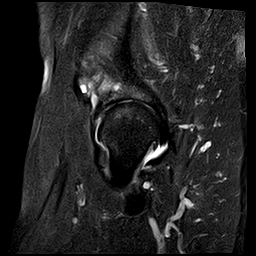
[im 15/20]
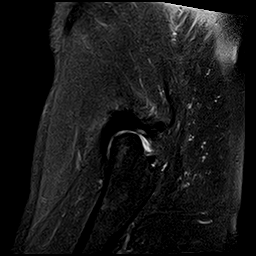
[im 20/20]
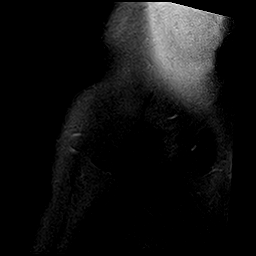

[24 of 40 positions shown; findings below may reference images not displayed]

FINDINGS: Moderate degenerative arthritis of left hip joint is noted.

On the right side, severe grade 4 degenerative arthritis is noted with near complete loss of cartilage space in the superior aspect of the articular surface at the right hip.  Mild subchondral bone marrow edema on both sides of the superior aspect of the right hip suggestive of grade 4 degenerative changes.

Chronic changes with chronic tear of the superior aspect of the acetabular labrum is noted with paralabral cyst in the superior aspect measuring 2.5 cm in diameter.

No MRI evidence of avascular necrosis.

Effusion in the right hip joint is noted of moderate degree.  No osteochondral loose bodies are seen in the fluid.
IMPRESSION: 1. Advanced grade 4 degenerative changes of the superior articular aspect of the right hip joint are noted with subchondral bone marrow edema on both sides.

2. Severe degenerative changes and chronic tear of the superior aspect of the acetabular labrum with large paralabral cyst at the superior aspect of right hip, 2.5 cm in diameter.

3. Effusion in the right hip joint.

## 2022-12-22 ENCOUNTER — Ambulatory Visit: Payer: Managed Care, Other (non HMO) | Attending: Family Medicine | Admitting: Family Medicine

## 2022-12-22 ENCOUNTER — Encounter (RURAL_HEALTH_CENTER): Payer: Self-pay | Admitting: Family Medicine

## 2022-12-22 ENCOUNTER — Ambulatory Visit (RURAL_HEALTH_CENTER): Payer: Managed Care, Other (non HMO) | Attending: Family Medicine | Admitting: Family Medicine

## 2022-12-22 ENCOUNTER — Other Ambulatory Visit: Payer: Self-pay

## 2022-12-22 VITALS — BP 130/70 | HR 60 | Temp 96.1°F | Ht 68.0 in | Wt 176.0 lb

## 2022-12-22 DIAGNOSIS — K581 Irritable bowel syndrome with constipation: Secondary | ICD-10-CM | POA: Insufficient documentation

## 2022-12-22 DIAGNOSIS — R350 Frequency of micturition: Secondary | ICD-10-CM | POA: Insufficient documentation

## 2022-12-22 DIAGNOSIS — I10 Essential (primary) hypertension: Secondary | ICD-10-CM | POA: Insufficient documentation

## 2022-12-22 DIAGNOSIS — E785 Hyperlipidemia, unspecified: Secondary | ICD-10-CM | POA: Insufficient documentation

## 2022-12-22 DIAGNOSIS — N182 Chronic kidney disease, stage 2 (mild): Secondary | ICD-10-CM | POA: Insufficient documentation

## 2022-12-22 DIAGNOSIS — J329 Chronic sinusitis, unspecified: Secondary | ICD-10-CM | POA: Insufficient documentation

## 2022-12-22 DIAGNOSIS — K921 Melena: Secondary | ICD-10-CM | POA: Insufficient documentation

## 2022-12-22 DIAGNOSIS — Z79899 Other long term (current) drug therapy: Secondary | ICD-10-CM | POA: Insufficient documentation

## 2022-12-22 DIAGNOSIS — Z6826 Body mass index (BMI) 26.0-26.9, adult: Secondary | ICD-10-CM | POA: Insufficient documentation

## 2022-12-22 DIAGNOSIS — L570 Actinic keratosis: Secondary | ICD-10-CM | POA: Insufficient documentation

## 2022-12-22 DIAGNOSIS — E663 Overweight: Secondary | ICD-10-CM | POA: Insufficient documentation

## 2022-12-22 DIAGNOSIS — R042 Hemoptysis: Secondary | ICD-10-CM | POA: Insufficient documentation

## 2022-12-22 DIAGNOSIS — G43E09 Chronic migraine with aura, not intractable, without status migrainosus: Secondary | ICD-10-CM | POA: Insufficient documentation

## 2022-12-22 DIAGNOSIS — M25551 Pain in right hip: Secondary | ICD-10-CM | POA: Insufficient documentation

## 2022-12-22 LAB — CBC WITH DIFF
BASOPHIL #: 0 10*3/uL (ref 0.00–0.10)
BASOPHIL %: 1 % (ref 0–1)
EOSINOPHIL #: 0.1 10*3/uL (ref 0.00–0.50)
EOSINOPHIL %: 2 %
HCT: 41 % (ref 36.7–47.1)
HGB: 13.6 g/dL (ref 12.5–16.3)
LYMPHOCYTE #: 1.3 10*3/uL (ref 1.00–3.00)
LYMPHOCYTE %: 23 % (ref 16–44)
MCH: 31.3 pg (ref 23.8–33.4)
MCHC: 33.3 g/dL (ref 32.5–36.3)
MCV: 94.1 fL (ref 73.0–96.2)
MONOCYTE #: 0.4 10*3/uL (ref 0.30–1.00)
MONOCYTE %: 6 % (ref 5–13)
MPV: 8.8 fL (ref 7.4–11.4)
NEUTROPHIL #: 4 10*3/uL (ref 1.85–7.80)
NEUTROPHIL %: 68 % (ref 43–77)
PLATELETS: 231 10*3/uL (ref 140–440)
RBC: 4.35 10*6/uL (ref 4.06–5.63)
RDW: 14.4 % (ref 12.1–16.2)
WBC: 5.8 10*3/uL (ref 3.6–10.2)

## 2022-12-22 LAB — BASIC METABOLIC PANEL
ANION GAP: 5 mmol/L (ref 4–13)
BUN/CREA RATIO: 24 — ABNORMAL HIGH (ref 6–22)
BUN: 26 mg/dL — ABNORMAL HIGH (ref 7–25)
CALCIUM: 9.8 mg/dL (ref 8.6–10.3)
CHLORIDE: 110 mmol/L — ABNORMAL HIGH (ref 98–107)
CO2 TOTAL: 26 mmol/L (ref 21–31)
CREATININE: 1.07 mg/dL (ref 0.60–1.30)
ESTIMATED GFR: 73 mL/min/{1.73_m2} (ref >59)
GLUCOSE: 100 mg/dL (ref 74–109)
OSMOLALITY, CALCULATED: 286 mOsm/kg (ref 270–290)
POTASSIUM: 4.9 mmol/L (ref 3.5–5.1)
SODIUM: 141 mmol/L (ref 136–145)

## 2022-12-22 LAB — MAGNESIUM: MAGNESIUM: 2.1 mg/dL (ref 1.9–2.7)

## 2022-12-22 LAB — POCT RAPID STREP A: RAPID STREP A (POCT): NEGATIVE

## 2022-12-22 MED ORDER — LINACLOTIDE 145 MCG CAPSULE
145.0000 ug | ORAL_CAPSULE | Freq: Every morning | ORAL | 11 refills | Status: DC
Start: 2022-12-22 — End: 2024-07-05

## 2022-12-22 MED ORDER — NURTEC ODT 75 MG DISINTEGRATING TABLET
75.0000 mg | ORAL_TABLET | Freq: Every day | ORAL | 11 refills | Status: DC | PRN
Start: 2022-12-22 — End: 2024-01-28

## 2022-12-22 MED ORDER — DILTIAZEM CD 240 MG CAPSULE,EXTENDED RELEASE 24 HR
240.0000 mg | ORAL_CAPSULE | Freq: Every morning | ORAL | 3 refills | Status: DC
Start: 2022-12-22 — End: 2023-03-18

## 2022-12-22 MED ORDER — BACLOFEN 10 MG TABLET: 10.0000 mg | ORAL_TABLET | Freq: Every day | ORAL | 3 refills | Status: DC

## 2022-12-22 MED ORDER — PANTOPRAZOLE 40 MG TABLET,DELAYED RELEASE
40.0000 mg | DELAYED_RELEASE_TABLET | Freq: Every day | ORAL | 1 refills | Status: DC
Start: 2022-12-22 — End: 2023-07-29

## 2022-12-22 MED ORDER — AYR SALINE NASAL GEL
Freq: Four times a day (QID) | NASAL | 3 refills | Status: DC | PRN
Start: 2022-12-22 — End: 2023-03-18

## 2022-12-22 MED ORDER — LISINOPRIL 20 MG TABLET
20.0000 mg | ORAL_TABLET | Freq: Every day | ORAL | 3 refills | Status: DC
Start: 2022-12-22 — End: 2023-03-18

## 2022-12-22 MED ORDER — RIVAROXABAN 20 MG TABLET
20.0000 mg | ORAL_TABLET | Freq: Every day | ORAL | 0 refills | Status: DC
Start: 2022-12-22 — End: 2024-04-04

## 2022-12-22 MED ORDER — CLONAZEPAM 1 MG TABLET
1.0000 mg | ORAL_TABLET | Freq: Every evening | ORAL | 3 refills | Status: DC | PRN
Start: 2022-12-22 — End: 2023-03-29

## 2022-12-22 MED ORDER — GEMTESA 75 MG TABLET
1.0000 | ORAL_TABLET | Freq: Every day | ORAL | 3 refills | Status: AC
Start: 2022-12-22 — End: 2023-12-22

## 2022-12-22 MED ORDER — AMOXICILLIN 875 MG-POTASSIUM CLAVULANATE 125 MG TABLET
1.0000 | ORAL_TABLET | Freq: Two times a day (BID) | ORAL | 0 refills | Status: DC
Start: 2022-12-22 — End: 2023-01-19

## 2022-12-22 MED ORDER — ROSUVASTATIN 10 MG TABLET
10.0000 mg | ORAL_TABLET | Freq: Every evening | ORAL | 3 refills | Status: DC
Start: 2022-12-22 — End: 2024-01-03

## 2022-12-22 MED ORDER — SUCRALFATE 1 GRAM TABLET
1.0000 g | ORAL_TABLET | Freq: Four times a day (QID) | ORAL | 5 refills | Status: DC
Start: 2022-12-22 — End: 2024-04-04

## 2022-12-22 NOTE — Assessment & Plan Note (Signed)
Refer to Urology.  Add trial gemtesa 75 mg daily

## 2022-12-22 NOTE — Assessment & Plan Note (Signed)
Increase linzess to 129mg from 72. Take on an empty stomach

## 2022-12-22 NOTE — Assessment & Plan Note (Signed)
Liquid nitrogen used today

## 2022-12-22 NOTE — Assessment & Plan Note (Signed)
No current issues

## 2022-12-22 NOTE — Assessment & Plan Note (Signed)
Does well with nurtec. Headaches are now much less frequent. He does take baclofen for a muscle relaxant

## 2022-12-22 NOTE — Assessment & Plan Note (Signed)
Avoid regular use of motrin/aleve/ibuprofen

## 2022-12-22 NOTE — Assessment & Plan Note (Signed)
Refer for injection under fluoroscopy. [facility] Will send and request the results so we can upload them in his chart

## 2022-12-22 NOTE — Progress Notes (Signed)
[facility], [place]  [place] [address]  Operated by [facility]     Name: Ricky Grimes MRN:  [id number]   Date of Birth: [birth year] Age: 72 y.o.   Date: 12/22/2022  Time: 21:47     Provider: Feliberto Harts, DO    Assessment/Plan:    Problem List Items Addressed This Visit          Cardiovascular System    Hyperlipidemia    Hypertension    Relevant Orders    CBC/DIFF (Completed)    BASIC METABOLIC PANEL (Completed)       Neurologic    Migraines     Does well with nurtec. Headaches are now much less frequent. He does take baclofen for a muscle relaxant         Relevant Orders    MAGNESIUM       Nephrology    Chronic kidney disease (CKD), active medical management without dialysis, stage 2 (mild)     Avoid regular use of motrin/aleve/ibuprofen         Relevant Orders    BASIC METABOLIC PANEL (Completed)       Digestive    Irritable bowel syndrome with constipation     Increase linzess to 131mg from 72. Take on an empty stomach            Musculoskeletal    Hip pain, right     Refer for injection under fluoroscopy. [facility]. Will send and request the results so we can upload them in his chart         Relevant Orders    FLUORO INJECTION HIP RIGHT    [facility]       Ear, Nose, and Throat    Sinusitis     Augmentin. Push fluids. F/U if sx fail to resolve or worsen         Relevant Orders    POCT Rapid Strep (Completed)       Dermatology    Actinic keratosis       Other    Urinary frequency     Refer to Urology.  Add trial gemtesa 75 mg daily         Relevant Orders    Referral to [facility]   Chronic prescription benzodiazepine use    Hematochezia     No current issues         Overweight    Relevant Orders    BASIC METABOLIC PANEL (Completed)     Other Visit Diagnoses       Hemoptysis    -  Primary    Relevant Orders    CT CHEST WO IV CONTRAST           For Actinic  keratosis  Liquid nitrogen to left ear X 3    For sinusitis, prescribed Augmentin. Refer to ENT     Reason for visit: Cough (Sore throat sinus drainage)      History of Present Illness:  Ricky HOLSTONis a 72y.o. male with atrial fibrillation, GERD, Migraine headaches, Chronic knee pain, hyperlipidemia and B12 deficiency presents in follow up for chronic disease management.      He has chronic cough. Has some cough productive of phlegm with blood tinge. He has been sick for the past couple of weeks. Seems he always has congestion. No associated fever but has sinus  pressure.    Has frequent urination all day long with associated bladder discomfort    Migraines are better with nurtec    Historical Data    Past Medical History:  Past Medical History:   Diagnosis Date    Anticoagulant long-term use     Anxiety     Atrial fibrillation (CMS HCC)     Hypercholesterolemia     Hypertension     Migraine     Rectal bleeding     S/P rotator cuff repair          Past Surgical History:  Past Surgical History:   Procedure Laterality Date    KNEE ARTHROSCOPY Left          Allergies:  Allergies   Allergen Reactions    Metoclopramide  Other Adverse Reaction (Add comment)     Shakes unable to be still     Medications:  Butenafine 1 % Cream, Apply topically Topical twice daily  cholecalciferol, vitamin D3, 100 mcg (4,000 unit) Oral Tablet, Take 1 Tablet (4,000 Units total) by mouth Once a day for 90 days  fluticasone propionate (FLONASE) 50 mcg/actuation Nasal Spray, Suspension, Administer 1 Spray into each nostril Once a day for 90 days Indications: inflammation of the nose due to an allergy  NALOXONE 4 MG/ACTUATION NASAL SPRAY - ED TO GO, 1 Spray by INTRANASAL route Every 2 minutes as needed for actual or suspected opioid overdose. Call 911 if used.  nitroGLYCERIN (NITROSTAT) 0.4 mg Sublingual Tablet, Sublingual, Place 1 Tablet (0.4 mg total) under the tongue Every 5 minutes as needed for Chest pain for 3 doses over 15  minutes  tamsulosin (FLOMAX) 0.4 mg Oral Capsule, Take 1 Capsule (0.4 mg total) by mouth Every evening after dinner for 90 days  baclofen (LIORESAL) 10 mg Oral Tablet, Take 1 Tablet (10 mg total) by mouth Twice daily for 90 days  clonazePAM (KLONOPIN) 1 mg Oral Tablet, Take 1 Tablet (1 mg total) by mouth Every night as needed (and occassionally for panic attacks.) Indications: Anxiety and sleep aid.  dilTIAZem (CARDIZEM CD) 240 mg Oral Capsule, Sust. Release 24 hr, Take 1 Capsule (240 mg total) by mouth Every morning  linaCLOtide (LINZESS) 72 mcg Oral Capsule, Take 1 Capsule (72 mcg total) by mouth Every morning for 180 days  lisinopriL (PRINIVIL) 20 mg Oral Tablet, Take 1 Tablet (20 mg total) by mouth Once a day for 90 days  pantoprazole (PROTONIX) 40 mg Oral Tablet, Delayed Release (E.C.), Take 1 Tablet (40 mg total) by mouth Once a day  rimegepant (NURTEC ODT) 75 mg Oral Tablet, Rapid Dissolve, Take 1 Tablet (75 mg total) by mouth Once per day as needed for Other (Migraines)  rivaroxaban (XARELTO) 20 mg Oral Tablet, Take 1 Tablet (20 mg total) by mouth Once a day for 90 days Indications: treatment to prevent blood clots in chronic atrial fibrillation  rosuvastatin (CRESTOR) 10 mg Oral Tablet, Take 1 Tablet (10 mg total) by mouth Every evening for 90 days  sucralfate (CARAFATE) 1 gram Oral Tablet, Take 1 Tablet (1 g total) by mouth Every 6 hours    No facility-administered medications prior to visit.     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   Tobacco Use    Smoking status: Never    Smokeless tobacco: Never   Vaping Use    Vaping Use: Never used   Substance and Sexual Activity    Alcohol use: Yes    Drug use: Never    Sexual activity: Yes     Partners: Female     Birth control/protection: None     Social Determinants of Health     Financial Resource Strain: Low Risk  (07/30/2022)    Financial Resource Strain     SDOH Financial: No   Transportation Needs: Low Risk  (07/30/2022)    Transportation Needs     SDOH Transportation: No   Social Connections: Low Risk  (07/30/2022)    Social Connections     SDOH Social Isolation: 5 or more times a week   Intimate Partner Violence: Low Risk  (07/30/2022)    Intimate Partner Violence     SDOH Domestic Violence: No   Housing Stability: Low Risk  (07/30/2022)    Housing Stability     SDOH Housing Situation: I have housing.     SDOH Housing Worry: No           Review of Systems:  Any pertinent Review of Systems as addressed in the HPI above.    Physical Exam:  Vital Signs:  Vitals:    12/22/22 1008   BP: 130/70   Pulse: 60   Temp: (!) 35.6 C (96.1 F)   TempSrc: Tympanic   SpO2: 99%   Weight: 79.8 kg (176 lb)   Height: 1.727 m ('5\' 8"'$ )   BMI: 26.82     Physical Exam  Constitutional:       Appearance: Normal appearance.   HENT:      Head: Normocephalic and atraumatic.      Right Ear: Tympanic membrane normal.      Left Ear: Tympanic membrane normal.      Nose: Nose normal.      Mouth/Throat:      Mouth: Mucous membranes are moist.      Pharynx: No oropharyngeal exudate or posterior oropharyngeal erythema.   Eyes:      Extraocular Movements: Extraocular movements intact.      Conjunctiva/sclera: Conjunctivae normal.   Cardiovascular:      Rate and Rhythm: Normal rate and regular rhythm.      Heart sounds: Normal heart sounds.   Pulmonary:      Effort: Pulmonary effort is normal.      Breath sounds: Normal breath sounds.   Abdominal:      Palpations: Abdomen is soft.       Tenderness: There is no abdominal tenderness.   Musculoskeletal:         General: No swelling.      Cervical back: Neck supple. Swelling, spasms, tenderness and crepitus present. No erythema, signs of trauma, lacerations or rigidity. Decreased range of motion.      Right lower leg: No edema.      Left lower leg: No edema.   Lymphadenopathy:      Cervical: No cervical adenopathy.   Skin:     General: Skin is warm and dry.  Findings: Lesion present. No rash.      Comments: Left ear with thickened scale   Neurological:      General: No focal deficit present.      Mental Status: He is alert and oriented to person, place, and time.   Psychiatric:         Mood and Affect: Mood normal.         Behavior: Behavior normal.         Thought Content: Thought content normal.         Judgment: Judgment normal.       Feliberto Harts, DO     Portions of this note may be dictated using voice recognition software or a dictation service. Variances in spelling and vocabulary are possible and unintentional. Not all errors are caught/corrected. Please notify the [facility] if any discrepancies are noted or if the meaning of any statement is not clear.

## 2022-12-22 NOTE — Procedures (Signed)
FAMILY MEDICINE, [place]  [place] [address]  Operated by [facility]  Procedure Note    Name: Ricky Grimes MRN:  [id number]   Date: 12/22/2022 Age: 72 y.o.  DOB:   [birth year]       17110 - DESTRUCTION OF BENIGN LESIONS OTHER THAN SKIN TAGS/CUTANEOUS VASCULAR PROLIFERATIVE LESIONS; UP TO 14 LESIONS (AMB ONLY)    Performed by: Feliberto Harts, DO  Authorized by: Feliberto Harts, DO    Time Out:     Immediately before the procedure, a time out was called:  Yes    Patient verified:  Yes    Procedure Verified:  Yes    Site Verified:  Yes    Other:  Left ear  Documentation:      Cycles of 5 seconds used of liquid nitrogen X 5. Pt tolerated well without issue      Feliberto Harts, DO

## 2022-12-22 NOTE — Assessment & Plan Note (Signed)
Augmentin. Push fluids. F/U if sx fail to resolve or worsen

## 2022-12-24 ENCOUNTER — Telehealth (RURAL_HEALTH_CENTER): Payer: Self-pay | Admitting: Family Medicine

## 2022-12-24 MED ORDER — PROMETHAZINE-DM 6.25 MG-15 MG/5 ML ORAL SYRUP
5.0000 mL | ORAL_SOLUTION | Freq: Four times a day (QID) | ORAL | 0 refills | Status: DC | PRN
Start: 2022-12-24 — End: 2023-02-25

## 2022-12-24 NOTE — Telephone Encounter (Unsigned)
Patient called back needing cough syrup she approved phenergan dm 1 tsp Q4-6 hr prn 240 ml no refill sent to pharmacy. Also sample of gemtesa for urine issue he will let us know if this helps call or return prn/br

## 2022-12-26 ENCOUNTER — Encounter (RURAL_HEALTH_CENTER): Payer: Self-pay | Admitting: Family Medicine

## 2022-12-28 ENCOUNTER — Telehealth (RURAL_HEALTH_CENTER): Payer: Self-pay | Admitting: Family Medicine

## 2022-12-30 ENCOUNTER — Other Ambulatory Visit (RURAL_HEALTH_CENTER): Payer: Self-pay | Admitting: Family Medicine

## 2022-12-30 DIAGNOSIS — R059 Cough, unspecified: Secondary | ICD-10-CM

## 2023-01-18 ENCOUNTER — Ambulatory Visit (HOSPITAL_COMMUNITY): Payer: Self-pay

## 2023-01-19 ENCOUNTER — Other Ambulatory Visit: Payer: Self-pay

## 2023-01-19 ENCOUNTER — Encounter (INDEPENDENT_AMBULATORY_CARE_PROVIDER_SITE_OTHER): Payer: Self-pay | Admitting: NURSE PRACTITIONER

## 2023-01-19 ENCOUNTER — Ambulatory Visit (INDEPENDENT_AMBULATORY_CARE_PROVIDER_SITE_OTHER): Payer: Managed Care, Other (non HMO) | Admitting: NURSE PRACTITIONER

## 2023-01-19 VITALS — Ht 68.0 in | Wt 165.0 lb

## 2023-01-19 DIAGNOSIS — R490 Dysphonia: Secondary | ICD-10-CM

## 2023-01-19 DIAGNOSIS — R0982 Postnasal drip: Secondary | ICD-10-CM

## 2023-01-19 DIAGNOSIS — J302 Other seasonal allergic rhinitis: Secondary | ICD-10-CM

## 2023-01-19 MED ORDER — IPRATROPIUM BROMIDE 21 MCG (0.03 %) NASAL SPRAY
2.0000 | Freq: Three times a day (TID) | NASAL | 3 refills | Status: DC
Start: 2023-01-19 — End: 2023-02-16

## 2023-01-19 NOTE — Procedures (Signed)
ENT, [place]  [place] [address]    Procedure Note    Name: Ricky Grimes MRN:  [id number]   Date: 01/19/2023 Age: 72 y.o.  DOB:   04-Jun-1951       31575 - LARYNGOSCOPY, FLEXIBLE DIAGNOSTIC (AMB ONLY)    Performed by: Emiliano Dyer, FNP-BC  Authorized by: Emiliano Dyer, FNP-BC    Time Out:     Immediately before the procedure, a time out was called:  Yes    Patient verified:  Yes    Procedure Verified:  Yes    Site Verified:  Yes  Documentation:      Indications for procedure: Unable to perform indirect laryngoscopy secondary to gag reflex, Unable to fully visualize laryngeal anatomy on indirect laryngoscopy, Hoarseness, and Chronic postnasal drip    Anesthesia: Oxymetazoline nasal spray    Description: The flexible endoscope was gently introduced into the nostril and passed along the floor of the nose to the nasopharynx. Adenoid was minimal and eustachian tubes normal. The retropalatal airway was patent.    The endoscope was passed to the oropharynx. Base of tongue displayed normal lingual tonsils, patent valelulla, and sharply defined upright epiglottis. Retrolingual airway was patent.    The larynx displayed normal true vocal cords with good mobility. False cords were normal. Arytenoid mucosa was pink with no edema.     The piriform recesses were symmetric without secretion. The hypopharynx was symmetric without lesion.    Findings: Symmetrical true vocal fold motion    The patient tolerated the procedure well.                 Emiliano Dyer, FNP-BC

## 2023-01-19 NOTE — Progress Notes (Signed)
ENT, [place]  [place] [address]  Phone: [phone]  Fax: [phone]      Encounter Date: 01/19/2023    Patient ID: Ricky Grimes  MRN: [id number]    DOB: Aug 26, 1951  Age: 72 y.o. male         Referring Provider:    No referring provider defined for this encounter.    Reason for Visit:   Chief Complaint   Patient presents with    Sinus Problem     Patient complains of runny nose, PND, cough, hoarseness and nasal congestion        History of Present Illness:  Ricky Grimes is a 72 y.o. male 3-4 week history of post nasal drainage, bilateral nasal drainage, hoarseness, and cough.  Treated with 2 oral antibiotics, Singulair, and Claritin.  No significant improvement. Concerned that symptoms could be triggered by dog allergies      Patient History:  Patient Active Problem List   Diagnosis    Chronic anticoagulation    Hyperlipidemia    Hypertension    Irritable bowel syndrome with constipation    Vitamin D deficiency    Vitamin B 12 deficiency    Urinary frequency    Allergic rhinitis    GERD with esophagitis    A-fib (CMS HCC)    Onychomycosis    It band syndrome, right    Prostate cancer screening    Chronic prescription benzodiazepine use    Sleeping difficulties    Migraines    Fever blister    Neck pain, chronic    Hematochezia    Sinusitis    Chronic kidney disease (CKD), active medical management without dialysis, stage 2 (mild)    Hip pain, right    Overweight    Actinic keratosis    Osteoarthritis of right hip    Pain in right hand    Trochanteric bursitis of right hip     Current Outpatient Medications   Medication Sig    aspirin (ECOTRIN) 81 mg Oral Tablet, Delayed Release (E.C.) take 1 tablet every day for afib    baclofen (LIORESAL) 10 mg Oral Tablet Take 1 Tablet (10 mg total) by mouth Once a day    Butenafine 1 % Cream Apply topically Topical twice daily    cholecalciferol, vitamin D3, 100 mcg (4,000 unit) Oral Tablet Take 1 Tablet (4,000 Units total) by mouth Once a  day for 90 days    clonazePAM (KLONOPIN) 1 mg Oral Tablet Take 1 Tablet (1 mg total) by mouth Every night as needed (and occassionally for panic attacks.) Indications: Anxiety and sleep aid.    dilTIAZem (CARDIZEM CD) 240 mg Oral Capsule, Sust. Release 24 hr Take 1 Capsule (240 mg total) by mouth Every morning    ipratropium bromide (ATROVENT) 21 mcg (0.03 %) Nasal nasal spray Administer 2 Sprays into affected nostril(s) Three times a day    linaCLOtide (LINZESS) 145 mcg Oral Capsule Take 1 Capsule (145 mcg total) by mouth Every morning Indications: irritable bowel syndrome with constipation    lisinopriL (PRINIVIL) 20 mg Oral Tablet Take 1 Tablet (20 mg total) by mouth Once a day    loratadine (CLARITIN) 10 mg Oral Tablet Take 1 Tablet (10 mg total) by mouth Once per day as needed    montelukast (SINGULAIR) 10 mg Oral Tablet Take 1 Tablet (10 mg total) by mouth Once per day as needed    NALOXONE 4 MG/ACTUATION NASAL SPRAY - ED TO GO 1  Spray by INTRANASAL route Every 2 minutes as needed for actual or suspected opioid overdose. Call 911 if used.    nitroGLYCERIN (NITROSTAT) 0.4 mg Sublingual Tablet, Sublingual Place 1 Tablet (0.4 mg total) under the tongue Every 5 minutes as needed for Chest pain for 3 doses over 15 minutes    pantoprazole (PROTONIX) 40 mg Oral Tablet, Delayed Release (E.C.) Take 1 Tablet (40 mg total) by mouth Once a day    promethazine-dextromethorphan (PHENERGAN-DM) 6.25-15 mg/5 mL Oral Syrup Take 5 mL by mouth Four times a day as needed for Cough    rimegepant (NURTEC ODT) 75 mg Oral Tablet, Rapid Dissolve Place 1 Tablet (75 mg total) under the tongue Once per day as needed for Other (Migraines)    rivaroxaban (XARELTO) 20 mg Oral Tablet Take 1 Tablet (20 mg total) by mouth Once a day for 90 days Indications: treatment to prevent blood clots in chronic atrial fibrillation    rosuvastatin (CRESTOR) 10 mg Oral Tablet Take 1 Tablet (10 mg total) by mouth Every evening    sodium chloride-aloe vera  (AYR SALINE) Gel Apply to each nostril Four times a day as needed    sucralfate (CARAFATE) 1 gram Oral Tablet Take 1 Tablet (1 g total) by mouth Every 6 hours    tamsulosin (FLOMAX) 0.4 mg Oral Capsule Take 1 Capsule (0.4 mg total) by mouth Every evening after dinner for 90 days    vibegron (GEMTESA) 75 mg Oral Tablet Take 1 Tablet (75 mg total) by mouth Once a day     Allergies   Allergen Reactions    Metoclopramide  Other Adverse Reaction (Add comment)     Shakes unable to be still     Past Medical History:   Diagnosis Date    Anticoagulant long-term use     Anxiety     Atrial fibrillation (CMS HCC)     Hypercholesterolemia     Hypertension     Migraine     Rectal bleeding     S/P rotator cuff repair       Past Surgical History:   Procedure Laterality Date    KNEE ARTHROSCOPY Left       Family Medical History:       Problem Relation (Age of Onset)    Atrial fibrillation Father    Cancer Father    No Known Problems Mother, Sister, Brother, Half-Sister, Half-Brother, Maternal Aunt, Maternal Uncle, Paternal Aunt, Paternal Uncle, Maternal Grandmother, Maternal Grandfather, Paternal Grandmother, Paternal Grandfather, Daughter, Son            Social History     Tobacco Use    Smoking status: Never    Smokeless tobacco: Never   Vaping Use    Vaping status: Never Used   Substance Use Topics    Alcohol use: Yes    Drug use: Never       Review of Systems     Vitals:    01/19/23 0942   Weight: 74.8 kg (165 lb)   Height: 1.727 m (5' 8"$ )   BMI: 25.14      ENT Physical Exam  Constitutional  Appearance: patient appears well-developed, well-nourished and well-groomed,  Communication/Voice: communication appropriate for developmental age; vocal quality normal;  Head and Face  Appearance: head appears normal, face appears normal and face appears atraumatic;  Palpation: facial palpation normal;  Salivary: glands normal;  Ear  Hearing: intact;  Auricles: right auricle normal; left auricle normal;  External Mastoids: right external  mastoid normal;  left external mastoid normal;  Ear Canals: right ear canal normal; left ear canal normal;  Tympanic Membranes: right tympanic membrane normal; left tympanic membrane normal;  Nose  External Nose: nares patent bilaterally; nasal discharge visible;  Internal Nose: bilateral intranasal mucosa edematous; septum normal; bilateral inferior turbinates normal;  Oral Cavity/Oropharynx  Lips: normal;  Teeth: normal;  Gums: gingiva normal;  Tongue: normal;  Oral mucosa: normal;  Hard palate: normal;  Soft palate: normal;  Tonsils: normal;  Base of Tongue: normal;  Posterior pharyngeal wall: normal;  Neck  Neck: neck normal; neck palpation normal;  Thyroid: thyroid normal;  Respiratory  Inspection: breathing unlabored; normal breathing rate;  Lymphatic  Palpation: lymph nodes normal;  Neurovestibular  Mental Status: alert and oriented;  Psychiatric: mood normal; affect is appropriate;  Cranial Nerves: cranial nerves intact;       Assessment:  ENCOUNTER DIAGNOSES     ICD-10-CM   1. Seasonal allergic rhinitis, unspecified trigger  J30.2   2. Post-nasal drainage  R09.82   3. Hoarseness  R49.0       Plan:  Medical records reviewed on 01/19/2023.  Laryngoscopy completed and exam is normal.  No evidence of sinusitis on exam  Discussed allergy testing with patient and he is agreeable to this at follow up appointment  Atrovent nasal spray TID as needed for nasal draiange    Orders Placed This Encounter    31575 - LARYNGOSCOPY, FLEXIBLE DIAGNOSTIC (AMB ONLY)    ipratropium bromide (ATROVENT) 21 mcg (0.03 %) Nasal nasal spray     Return for SET.    Emiliano Dyer, FNP-BC  01/19/2023, 10:00

## 2023-02-02 ENCOUNTER — Encounter (INDEPENDENT_AMBULATORY_CARE_PROVIDER_SITE_OTHER): Payer: Commercial Managed Care - PPO | Admitting: NURSE PRACTITIONER

## 2023-02-02 ENCOUNTER — Other Ambulatory Visit: Payer: Self-pay

## 2023-02-04 ENCOUNTER — Ambulatory Visit (INDEPENDENT_AMBULATORY_CARE_PROVIDER_SITE_OTHER): Payer: Self-pay

## 2023-02-04 ENCOUNTER — Encounter (INDEPENDENT_AMBULATORY_CARE_PROVIDER_SITE_OTHER): Payer: Self-pay | Admitting: NURSE PRACTITIONER

## 2023-02-10 ENCOUNTER — Other Ambulatory Visit: Payer: Self-pay

## 2023-02-10 ENCOUNTER — Encounter (INDEPENDENT_AMBULATORY_CARE_PROVIDER_SITE_OTHER): Payer: Commercial Managed Care - PPO | Admitting: NURSE PRACTITIONER

## 2023-02-10 ENCOUNTER — Ambulatory Visit (INDEPENDENT_AMBULATORY_CARE_PROVIDER_SITE_OTHER): Payer: Commercial Managed Care - PPO

## 2023-02-11 ENCOUNTER — Ambulatory Visit (HOSPITAL_COMMUNITY): Payer: Self-pay

## 2023-02-15 ENCOUNTER — Inpatient Hospital Stay
Admission: RE | Admit: 2023-02-15 | Discharge: 2023-02-15 | Disposition: A | Discharge status: Home / Self Care | Payer: Managed Care, Other (non HMO) | Source: Ambulatory Visit | Attending: Family Medicine | Admitting: Family Medicine

## 2023-02-15 ENCOUNTER — Other Ambulatory Visit: Payer: Self-pay

## 2023-02-15 DIAGNOSIS — M1611 Unilateral primary osteoarthritis, right hip: Secondary | ICD-10-CM | POA: Insufficient documentation

## 2023-02-15 DIAGNOSIS — M25551 Pain in right hip: Secondary | ICD-10-CM | POA: Insufficient documentation

## 2023-02-15 MED ORDER — TRIAMCINOLONE ACETONIDE 40 MG/ML SUSPENSION FOR INJECTION
80.0000 mg | Freq: Once | INTRAMUSCULAR | Status: AC
Start: 2023-02-15 — End: 2023-02-15
  Administered 2023-02-15: 80 mg via INTRA_ARTICULAR

## 2023-02-15 MED ORDER — IOHEXOL 350 MG IODINE/ML INTRAVENOUS SOLUTION
5.0000 mL | INTRAVENOUS | Status: AC
Start: 2023-02-15 — End: 2023-02-15
  Administered 2023-02-15: 5 mL via INTRA_ARTICULAR

## 2023-02-15 MED ORDER — BUPIVACAINE HCL 0.25 % (2.5 MG/ML) INJECTION SOLUTION
INTRAMUSCULAR | Status: AC
Start: 2023-02-15 — End: 2023-02-15
  Filled 2023-02-15: qty 50

## 2023-02-15 MED ORDER — LIDOCAINE HCL 20 MG/ML (2 %) INJECTION SOLUTION
INTRAMUSCULAR | Status: AC
Start: 2023-02-15 — End: 2023-02-15
  Filled 2023-02-15: qty 20

## 2023-02-15 MED ORDER — TRIAMCINOLONE ACETONIDE 40 MG/ML SUSPENSION FOR INJECTION
INTRAMUSCULAR | Status: AC
Start: 2023-02-15 — End: 2023-02-15
  Filled 2023-02-15: qty 2

## 2023-02-16 ENCOUNTER — Ambulatory Visit (INDEPENDENT_AMBULATORY_CARE_PROVIDER_SITE_OTHER): Payer: Managed Care, Other (non HMO) | Admitting: NURSE PRACTITIONER

## 2023-02-16 ENCOUNTER — Encounter (INDEPENDENT_AMBULATORY_CARE_PROVIDER_SITE_OTHER): Payer: Self-pay | Admitting: NURSE PRACTITIONER

## 2023-02-16 ENCOUNTER — Ambulatory Visit (INDEPENDENT_AMBULATORY_CARE_PROVIDER_SITE_OTHER): Payer: Managed Care, Other (non HMO)

## 2023-02-16 ENCOUNTER — Ambulatory Visit (INDEPENDENT_AMBULATORY_CARE_PROVIDER_SITE_OTHER): Payer: Self-pay | Admitting: Student in an Organized Health Care Education/Training Program

## 2023-02-16 VITALS — Ht 68.0 in | Wt 170.0 lb

## 2023-02-16 DIAGNOSIS — J301 Allergic rhinitis due to pollen: Secondary | ICD-10-CM

## 2023-02-16 DIAGNOSIS — J329 Chronic sinusitis, unspecified: Secondary | ICD-10-CM

## 2023-02-16 DIAGNOSIS — J302 Other seasonal allergic rhinitis: Secondary | ICD-10-CM

## 2023-02-16 DIAGNOSIS — J3081 Allergic rhinitis due to animal (cat) (dog) hair and dander: Secondary | ICD-10-CM

## 2023-02-16 DIAGNOSIS — Z91038 Other insect allergy status: Secondary | ICD-10-CM

## 2023-02-16 DIAGNOSIS — J3089 Other allergic rhinitis: Secondary | ICD-10-CM

## 2023-02-16 DIAGNOSIS — R0982 Postnasal drip: Secondary | ICD-10-CM

## 2023-02-16 MED ORDER — DEXAMETHASONE SODIUM PHOSPHATE 4 MG/ML INJECTION SOLUTION
6.0000 mg | INTRAMUSCULAR | Status: AC
Start: 2023-02-16 — End: 2023-02-16
  Administered 2023-02-16: 6 mg via INTRAMUSCULAR

## 2023-02-16 MED ORDER — IPRATROPIUM BROMIDE 42 MCG (0.06 %) NASAL SPRAY
2.0000 | Freq: Three times a day (TID) | NASAL | 5 refills | Status: DC
Start: 2023-02-16 — End: 2023-03-29

## 2023-02-16 NOTE — Nursing Note (Signed)
02/16/23 1100   Allergy Testing   Is the patient on a beta blocker? No   Does the patient have asthma? No   Site    Testing site 1 Right Side   [place] Grass   [place] Grass Dilution 5 5   [place] Grass Dilution 4 8   TOTAL [place] GRASS DILUTION @ 5   [place] Grass Total Single/Multiple Stick: 2   Timothy Grass   Timothy Grass Dilution 4 5   Timothy Grass Dilution 2 5   Timothy Grass Total Single/Multiple Stick: 2   [place] Dilution 4 5   [place] Dilution 2 5   [place] Total Single/Multiple Stick: 2   Ragweed   Ragweed Dilution 5 5   Ragweed Dilution 4 8   TOTAL RAGWEED DILUTION @ 5   Ragweed Total Single/Multiple Stick: 2   Mugwort   Mugwort Dilution 4 5   Mugwort Dilution 2 5   Mugwort Total Single/Multiple Stick: 2   [place] Thistle   [place] Thistle Dilution 4 5   Russian Thistle Dilution 2 5   Russian Thistle Total Single/Multiple Stick: 2   Maple (Box Elder)   Maple Dilution 4 5   Maple Dilution 3 7   Maple Dilution 2 7   TOTAL MAPLE DILUTION @ 3   Maple Total Single/Multiple Stick: 3   Red Oak   Assurant  Dilution 4 5   Red Oak  Dilution 2 5   Red Oak Total Single/Multiple Stick: 2   American Sycamore   American Sycamore Dilution 4 5   American Sycamore Dilution 2 5   American Sycamore Total Single/Multiple Stick: 2   Pine   Pine Dilution 4 5   Pine Dilution 3 5   Pine Dilution 2 8   TOTAL PINE DILUTION @ 3   Pine Total Single/Multiple Stick: 3   SITE   Testing site 2 Right Side   Cladosporium Sphaerospermum   Cladosporium Sphaerospermum Dilution 4 5   Cladosporium Sphaerospermum Dilution 2 5   Cladosporium Sphaerospermum Total Single/Multiple Stick: 2   Aspergillus   Aspergillus Dilution 4 5   Aspergillus Dilution 2 5   Aspergillus Total Single/Multiple Stick: 2   Alternaria   Alternaria Dilution 4 5   Alternaria Dilution 2 5   Alternaria Total Single/Multiple Stick: 2   Candida   Candida Dilution 4 5   Candida Dilution 2 5   Candida Total Single/Multiple Stick: 2   Epidermophyton   Epidermophyton  Dilution 4 5   Epidermophyton Dilution 2 5   Epidermophyton Total Single/Multiple Stick: 2   Sarocladium Strictum   Sarocladium Strictum Dilution 4 5   Sarocladium Strictum Dilution 3 7   Sarocladium Strictum Dilution 2 8   TOTAL SAROCLADIUM STRICTUM DILUTION @ 3   Sarocladium Strictum Total Single/Multiple Stick: 3   Bipolaris Sorokinina   Bipolaris Sorokiniana Dilution 4 5   Bipolaris [place]okiniana Dilution 2 5   Bipolaris Sorokiniana Total Single/Multiple Stick: 2   Penicillium   Penicillium Dilution 4 5   Penicillium Dilution 2 5   Penicillium Total Single/Multiple Stick: 2   Corn Smut   Corn Smut Dilution 4 5   Corn Smut Dilution 3 5   Corn Smut Dilution 2 8   TOTAL CORN SMUT DILUTION @ 3   Corn Smut Total Single/Multiple Stick: 3   Aureobasidium Pullulans   Aureobasidium Pullulans Dilution 4 5   Aureobasidium Pullulans Dilution 2 5   Aureobasidium Pullulans Total Single/Multiple  Stick: 2   Gibberella Pulicaris   Gibberella Pulicaris Dilution 4 5   Gibberella Pulicaris Dilution 2 5   Gibberella Pulicaris Total Single/Multiple Stick: 2   Epicoccum   Epicoccum Dilution 4 5   Epicoccum Dilution 2 5   Epicoccum Total Single/Multiple Stick: 2   Mucor   Mucor Dilution 4 5   Mucor Dilution 3 5   Mucor Dilution 2 8   TOTAL MUCOR DILUTION @ 3   Mucor Total Single/Multiple Stick: 3   Cockroach   Cockroach Dilution 4 5   Cockroach Dilution 3 5   Cockroach Dilution 2 8   TOTAL COCKROACH DILUTION @ 3   Cockroach Total Single/Multiple Stick: 3   Cat   Cat Dilution 4 5   Cat Dilution 3 5   Cat Dilution 2 7   TOTAL CAT DILUTION @ 2   Cat Total Single/Multiple Stick: 3   Dog   Dog Dilution 4 5   Dog Dilution 2 5   Dog Total Single/Multiple Stick: 2   Horse   Horse Dilution 4 5   Horse Dilution 2 5   Horse Total Single/Multiple Stick: 2   Rabbit   Rabbit Total Single/Multiple Stick: 3   Dustmite Farinae   Dustmite Farinae Dilution 4 5   Dustmite Farinae Dilution 3 5   Dustmite Farinae Dilution 2 9   TOTAL DUSTMITE FARINAE DILUTION  @ 3   Dustmite Farinae Total Single/Multiple Stick: 3   Dustmite Pteronyssinus   Dustmite Pteronyssinus Dilution 4 5   Dustmite Pteronyssinus Dilution 3 5   Dustmite Pteronyssinus Dilution 2 9   TOTAL DUSTMITE PTERONYSSINUS DILUTION @ 3   Dustmite Pteronyssinus Total Single/Multiple Stick: 3   Histamine    Histamine 7   Glycerin   Glycerin 2 5   Initials   Initials rs   Zavalla, LPN  I have reviewed the above allergy test results which were positive, and will pursue treatment accordingly.  Emiliano Dyer, FNP-BC

## 2023-02-16 NOTE — Progress Notes (Signed)
See SET results.

## 2023-02-16 NOTE — Progress Notes (Signed)
[facility], [place]  [place] [address]  Phone: [phone]  Fax: [phone]      Encounter Date: 02/16/2023    Patient ID: Ricky Grimes  MRN: [id number]    DOB: 01/06/1951  Age: 72 y.o. male     Progress Note       Referring Provider:  No ref. provider found    Reason for Visit:   Chief Complaint   Patient presents with    Follow-up After Testing     Rc after set testing, pt complains of runny nose, cough, watery eyes and sore throat        History of Present Illness:  Ricky Grimes is a 72 y.o. male follow up allergic rhinitis.  He continues to have constant runny nose bilaterally that is severe at times.  Taking Atrovent nasal spray, Singulair, and Claritin      Patient History:  Patient Active Problem List   Diagnosis    Chronic anticoagulation    Hyperlipidemia    Hypertension    Irritable bowel syndrome with constipation    Vitamin D deficiency    Vitamin B 12 deficiency    Urinary frequency    Allergic rhinitis    GERD with esophagitis    A-fib (CMS HCC)    Onychomycosis    It band syndrome, right    Prostate cancer screening    Chronic prescription benzodiazepine use    Sleeping difficulties    Migraines    Fever blister    Neck pain, chronic    Hematochezia    Sinusitis    Chronic kidney disease (CKD), active medical management without dialysis, stage 2 (mild)    Hip pain, right    Overweight    Actinic keratosis    Osteoarthritis of right hip    Pain in right hand    Trochanteric bursitis of right hip     Current Outpatient Medications   Medication Sig    aspirin (ECOTRIN) 81 mg Oral Tablet, Delayed Release (E.C.) take 1 tablet every day for afib    baclofen (LIORESAL) 10 mg Oral Tablet Take 1 Tablet (10 mg total) by mouth Once a day    Butenafine 1 % Cream Apply topically Topical twice daily    cholecalciferol, vitamin D3, 100 mcg (4,000 unit) Oral Tablet Take 1 Tablet (4,000 Units total) by mouth Once a day for 90 days    clonazePAM (KLONOPIN) 1 mg Oral Tablet Take 1 Tablet  (1 mg total) by mouth Every night as needed (and occassionally for panic attacks.) Indications: Anxiety and sleep aid.    dilTIAZem (CARDIZEM CD) 240 mg Oral Capsule, Sust. Release 24 hr Take 1 Capsule (240 mg total) by mouth Every morning    ipratropium bromide (ATROVENT) 42 mcg (0.06 %) Nasal Spray, Non-Aerosol Administer 2 Sprays into affected nostril(s) Three times a day    linaCLOtide (LINZESS) 145 mcg Oral Capsule Take 1 Capsule (145 mcg total) by mouth Every morning Indications: irritable bowel syndrome with constipation    lisinopriL (PRINIVIL) 20 mg Oral Tablet Take 1 Tablet (20 mg total) by mouth Once a day    loratadine (CLARITIN) 10 mg Oral Tablet Take 1 Tablet (10 mg total) by mouth Once per day as needed    montelukast (SINGULAIR) 10 mg Oral Tablet Take 1 Tablet (10 mg total) by mouth Once per day as needed    NALOXONE 4 MG/ACTUATION NASAL SPRAY - ED TO GO 1 Spray by INTRANASAL route Every  2 minutes as needed for actual or suspected opioid overdose. Call 911 if used.    nitroGLYCERIN (NITROSTAT) 0.4 mg Sublingual Tablet, Sublingual Place 1 Tablet (0.4 mg total) under the tongue Every 5 minutes as needed for Chest pain for 3 doses over 15 minutes    pantoprazole (PROTONIX) 40 mg Oral Tablet, Delayed Release (E.C.) Take 1 Tablet (40 mg total) by mouth Once a day    promethazine-dextromethorphan (PHENERGAN-DM) 6.25-15 mg/5 mL Oral Syrup Take 5 mL by mouth Four times a day as needed for Cough    rimegepant (NURTEC ODT) 75 mg Oral Tablet, Rapid Dissolve Place 1 Tablet (75 mg total) under the tongue Once per day as needed for Other (Migraines)    rivaroxaban (XARELTO) 20 mg Oral Tablet Take 1 Tablet (20 mg total) by mouth Once a day for 90 days Indications: treatment to prevent blood clots in chronic atrial fibrillation    rosuvastatin (CRESTOR) 10 mg Oral Tablet Take 1 Tablet (10 mg total) by mouth Every evening    sodium chloride-aloe vera (AYR SALINE) Gel Apply to each nostril Four times a day as needed     sucralfate (CARAFATE) 1 gram Oral Tablet Take 1 Tablet (1 g total) by mouth Every 6 hours    tamsulosin (FLOMAX) 0.4 mg Oral Capsule Take 1 Capsule (0.4 mg total) by mouth Every evening after dinner for 90 days    vibegron (GEMTESA) 75 mg Oral Tablet Take 1 Tablet (75 mg total) by mouth Once a day      Allergies   Allergen Reactions    Metoclopramide  Other Adverse Reaction (Add comment)     Shakes unable to be still     Past Medical History:   Diagnosis Date    Anticoagulant long-term use     Anxiety     Atrial fibrillation (CMS HCC)     Hypercholesterolemia     Hypertension     Migraine     Rectal bleeding     S/P rotator cuff repair      Past Surgical History:   Procedure Laterality Date    KNEE ARTHROSCOPY Left      Family Medical History:       Problem Relation (Age of Onset)    Atrial fibrillation Father    Cancer Father    No Known Problems Mother, Sister, Brother, Half-Sister, Half-Brother, Maternal Aunt, Maternal Uncle, Paternal Aunt, Paternal Uncle, Maternal Grandmother, Maternal Grandfather, Paternal Grandmother, Paternal Grandfather, Daughter, Son            Social History     Tobacco Use    Smoking status: Never    Smokeless tobacco: Never   Vaping Use    Vaping status: Never Used   Substance Use Topics    Alcohol use: Yes    Drug use: Never       Review of Systems     Vitals:    02/16/23 1154   Weight: 77.1 kg (170 lb)   Height: 1.727 m (5\' 8" )   BMI: 25.9      ENT Physical Exam  Constitutional  Appearance: patient appears well-developed, well-nourished and well-groomed,  Communication/Voice: communication appropriate for developmental age; vocal quality normal;  Head and Face  Appearance: head appears normal, face appears normal and face appears atraumatic;  Palpation: facial palpation normal;  Salivary: glands normal;  Ear  Hearing: intact;  Auricles: right auricle normal; left auricle normal;  External Mastoids: right external mastoid normal; left external mastoid normal;  Ear Canals:  right ear  canal normal; left ear canal normal;  Tympanic Membranes: right tympanic membrane normal; left tympanic membrane normal;  Nose  External Nose: nares patent bilaterally; nasal discharge visible;  Internal Nose: bilateral intranasal mucosa edematous; septum normal; bilateral inferior turbinates normal;  Oral Cavity/Oropharynx  Lips: normal;  Teeth: normal;  Gums: gingiva normal;  Tongue: normal;  Oral mucosa: normal;  Hard palate: normal;  Soft palate: normal;  Tonsils: normal;  Base of Tongue: normal;  Posterior pharyngeal wall: normal;  Neck  Neck: neck normal; neck palpation normal;  Thyroid: thyroid normal;  Respiratory  Inspection: breathing unlabored; normal breathing rate;  Lymphatic  Palpation: lymph nodes normal;  Neurovestibular  Mental Status: alert and oriented;  Psychiatric: mood normal; affect is appropriate;  Cranial Nerves: cranial nerves intact;       Assessment:  ENCOUNTER DIAGNOSES     ICD-10-CM   1. Post-nasal drainage  R09.82   2. Chronic sinusitis, unspecified location  J32.9   3. Seasonal allergic rhinitis, unspecified trigger  J30.2       Plan:  Medical records reviewed on 02/16/2023.  Reviewed allergy skin test results from today with patient.  The patient has various sensitivities to pollen, mold, dust mites, and cat.  I have discussed risks and benefits of immunotherapy injections.  Patient will start injections weekly.  I have discussed proper indications for administration of epinephrine should anaphylaxis occur.  Patient has also been instructed how to properly administer this via an auto injector.   CT sinuses to be scheduled, I will call him with results  Increase Atrovent nasal spray to 0.06% TID as needed    Orders Placed This Encounter    CT SINUSES WO IV CONTRAST    ipratropium bromide (ATROVENT) 42 mcg (0.06 %) Nasal Spray, Non-Aerosol    dexAMETHasone 4 mg/mL injection     Return in about 3 months (around 05/19/2023) for and 2 weeks allergy injections.    Emiliano Dyer, FNP-BC   02/16/2023, 12:07

## 2023-02-22 ENCOUNTER — Other Ambulatory Visit (INDEPENDENT_AMBULATORY_CARE_PROVIDER_SITE_OTHER): Payer: Managed Care, Other (non HMO) | Admitting: NURSE PRACTITIONER

## 2023-02-22 DIAGNOSIS — J301 Allergic rhinitis due to pollen: Secondary | ICD-10-CM

## 2023-02-22 DIAGNOSIS — J3089 Other allergic rhinitis: Secondary | ICD-10-CM

## 2023-02-22 NOTE — Nursing Note (Signed)
02/22/23 1000   ENT Immunotherapy Vial Preparation Flowsheet   Allergy Test Date 02/16/23   Managing Physician Sylwia Cuervo   Initials rs   A   Preparation Date 02/22/23   Expiration Date 06/07/23   Immunotherapy Status No escalation   Diagnosis Allergic Rhinitis due to pollen (J30.1)   [place] Grass   Initial Endpoint - [place] Grass 5   Dilution of Antigen 3   Volume of Antigen 0.68ml   Ragweed   Initial Endpoint - Ragweed 5   Dilution of Antigen 3   Volume of Antigen 0.25ml   Maple   Initial Endpoint - Maple 3   Dilution of Antigen 1   Volume of Antigen 0.88ml   Pine   Initial Endpoint - Pine 3   Dilution of Antigen 1   Volume of Antigen 0.56ml   A   Concentrates A 0.8   10% Glycerine Diluent A 4.7   Total Volume A 5.5   Comment A New Start   B   Preparation Date 02/22/23   Expiration Date 06/07/23   Immunotherapy Status No escalation   Diagnosis Allergy to cockroach (Z91.038);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);Allergic Rhinitis due to dustmite (J30.89);Allergic Rhinitis due to mold (J30.89)   Sarocladium Strictum   Initial Endpoint - Sarocladium Strictum 3   Dilution of Antigen 1   Volume of Antigen 0.81ml   Corn Smut   Initial Endpoint - Corn Smut 3   Dilution of Antigen 1   Volume of Antigen 0.69ml   Mucor   Initial Endpoint - Mucor 3   Dilution of Antigen 1   Volume of Antigen 0.74ml   Cockroach   Initial Endpoint - Cockroach 3   Dilution of Antigen 1   Volume of Antigen 0.44ml   Cat   Initial Endpoint - Cat 2   Dilution of Antigen Concentrate   Volume of Antigen 0.53ml   D. Farinae Dust Mite   Initial Endpoint - D. Farinae Dust Mite 3   Dilution of Antigen Concentrate   Volume of Antigen 0.37ml   D. Pteronyssinus   Initial Endpoint - D. Pteronyssinus 3   Dilution of Antigen Concentrate   Volume of Antigen 0.85ml   B   Concentrates B 1.4   10% Glycerine Diluent B 4.1   Total Volume B 5.5   Comment B [address]     Rita Southern, LPN

## 2023-02-23 ENCOUNTER — Telehealth (INDEPENDENT_AMBULATORY_CARE_PROVIDER_SITE_OTHER): Payer: Self-pay | Admitting: NURSE PRACTITIONER

## 2023-02-25 ENCOUNTER — Telehealth (RURAL_HEALTH_CENTER): Payer: Self-pay | Admitting: Family Medicine

## 2023-02-25 ENCOUNTER — Other Ambulatory Visit (RURAL_HEALTH_CENTER): Payer: Self-pay | Admitting: Family Medicine

## 2023-02-25 MED ORDER — PROMETHAZINE-DM 6.25 MG-15 MG/5 ML ORAL SYRUP
5.0000 mL | ORAL_SOLUTION | Freq: Four times a day (QID) | ORAL | 0 refills | Status: DC | PRN
Start: 2023-02-25 — End: 2023-03-29

## 2023-02-25 NOTE — Telephone Encounter (Signed)
Patient calls wants refill of phenergan dm to [facility] plese advise/br

## 2023-02-26 NOTE — Telephone Encounter (Signed)
Patient informed he may get from pharmacy/br

## 2023-03-08 ENCOUNTER — Ambulatory Visit (INDEPENDENT_AMBULATORY_CARE_PROVIDER_SITE_OTHER): Payer: Commercial Managed Care - PPO

## 2023-03-08 ENCOUNTER — Other Ambulatory Visit: Payer: Self-pay

## 2023-03-08 DIAGNOSIS — J309 Allergic rhinitis, unspecified: Secondary | ICD-10-CM

## 2023-03-08 NOTE — Nursing Note (Signed)
03/08/23 1500   Vial A   Set 0   Skin test/Wheal Size .49ml,7mm   Injection Laterality Right   Injection Site Upper Arm   Dose  0.05   Time Observed 30 Minutes   Injection Site Reaction Negative   Vial B   Set 0   Skin test/Wheal Size .26ml,7mm   Injection Laterality Left   Injection Site Upper Arm   Dose  0.05   Time Observed 30 Minutes   Injection Site Reaction Negative     April Carolynn Comment, LPN  I have reviewed the above allergy vial safety test, give injection.  Elnora Morrison, FNP-BC

## 2023-03-15 ENCOUNTER — Ambulatory Visit (INDEPENDENT_AMBULATORY_CARE_PROVIDER_SITE_OTHER): Payer: Commercial Managed Care - PPO

## 2023-03-15 ENCOUNTER — Other Ambulatory Visit: Payer: Self-pay

## 2023-03-15 DIAGNOSIS — J309 Allergic rhinitis, unspecified: Secondary | ICD-10-CM

## 2023-03-15 NOTE — Nursing Note (Signed)
03/15/23 1500   Vial A   Set 0   Injection Laterality Left   Injection Site Upper Arm   Dose  0.1   Time Observed 20 Minutes   Injection Site Reaction Negative   Vial B   Set 0   Injection Laterality Right   Injection Site Upper Arm   Dose  0.1   Time Observed 20 Minutes   Injection Site Reaction Negative     Laurie Penado Carolynn Comment, LPN

## 2023-03-16 ENCOUNTER — Emergency Department (HOSPITAL_BASED_OUTPATIENT_CLINIC_OR_DEPARTMENT_OTHER): Payer: Commercial Managed Care - PPO

## 2023-03-16 ENCOUNTER — Observation Stay (HOSPITAL_COMMUNITY): Payer: Commercial Managed Care - PPO

## 2023-03-16 ENCOUNTER — Observation Stay
Admission: EM | Admit: 2023-03-16 | Discharge: 2023-03-18 | Disposition: A | Discharge status: Home / Self Care | Payer: Commercial Managed Care - PPO | Attending: INTERNAL MEDICINE | Admitting: INTERNAL MEDICINE

## 2023-03-16 ENCOUNTER — Other Ambulatory Visit: Payer: Self-pay

## 2023-03-16 ENCOUNTER — Encounter (HOSPITAL_BASED_OUTPATIENT_CLINIC_OR_DEPARTMENT_OTHER): Payer: Self-pay

## 2023-03-16 DIAGNOSIS — I351 Nonrheumatic aortic (valve) insufficiency: Secondary | ICD-10-CM | POA: Insufficient documentation

## 2023-03-16 DIAGNOSIS — E7800 Pure hypercholesterolemia, unspecified: Secondary | ICD-10-CM | POA: Insufficient documentation

## 2023-03-16 DIAGNOSIS — I482 Chronic atrial fibrillation, unspecified: Secondary | ICD-10-CM

## 2023-03-16 DIAGNOSIS — I129 Hypertensive chronic kidney disease with stage 1 through stage 4 chronic kidney disease, or unspecified chronic kidney disease: Secondary | ICD-10-CM | POA: Insufficient documentation

## 2023-03-16 DIAGNOSIS — Z7982 Long term (current) use of aspirin: Secondary | ICD-10-CM | POA: Insufficient documentation

## 2023-03-16 DIAGNOSIS — I214 Non-ST elevation (NSTEMI) myocardial infarction: Secondary | ICD-10-CM | POA: Insufficient documentation

## 2023-03-16 DIAGNOSIS — R079 Chest pain, unspecified: Secondary | ICD-10-CM

## 2023-03-16 DIAGNOSIS — Z79899 Other long term (current) drug therapy: Secondary | ICD-10-CM | POA: Insufficient documentation

## 2023-03-16 DIAGNOSIS — I493 Ventricular premature depolarization: Secondary | ICD-10-CM | POA: Insufficient documentation

## 2023-03-16 DIAGNOSIS — Z7901 Long term (current) use of anticoagulants: Secondary | ICD-10-CM | POA: Insufficient documentation

## 2023-03-16 DIAGNOSIS — R011 Cardiac murmur, unspecified: Secondary | ICD-10-CM | POA: Insufficient documentation

## 2023-03-16 DIAGNOSIS — R7989 Other specified abnormal findings of blood chemistry: Secondary | ICD-10-CM | POA: Insufficient documentation

## 2023-03-16 DIAGNOSIS — I480 Paroxysmal atrial fibrillation: Secondary | ICD-10-CM | POA: Insufficient documentation

## 2023-03-16 DIAGNOSIS — I160 Hypertensive urgency: Principal | ICD-10-CM | POA: Insufficient documentation

## 2023-03-16 DIAGNOSIS — I10 Essential (primary) hypertension: Secondary | ICD-10-CM | POA: Diagnosis present

## 2023-03-16 HISTORY — DX: Presence of spectacles and contact lenses: Z97.3

## 2023-03-16 LAB — URINALYSIS, MACRO/MICRO
BILIRUBIN: NEGATIVE mg/dL
BLOOD: NEGATIVE mg/dL
GLUCOSE: NEGATIVE mg/dL
KETONES: NEGATIVE mg/dL
LEUKOCYTES: NEGATIVE WBCs/uL
NITRITE: NEGATIVE
PH: 6 (ref 4.6–8.0)
PROTEIN: NEGATIVE mg/dL
SPECIFIC GRAVITY: 1.025 (ref 1.003–1.035)
UROBILINOGEN: 0.2 mg/dL (ref 0.2–1.0)

## 2023-03-16 LAB — COMPREHENSIVE METABOLIC PANEL, NON-FASTING
ALBUMIN/GLOBULIN RATIO: 1 (ref 0.8–1.4)
ALBUMIN: 3.6 g/dL (ref 3.4–5.0)
ALKALINE PHOSPHATASE: 63 U/L (ref 46–116)
ALT (SGPT): 27 U/L (ref <=78)
ANION GAP: 11 mmol/L (ref 4–13)
AST (SGOT): 25 U/L (ref 15–37)
BILIRUBIN TOTAL: 0.3 mg/dL (ref 0.2–1.0)
BUN/CREA RATIO: 17
BUN: 22 mg/dL — ABNORMAL HIGH (ref 7–18)
CALCIUM, CORRECTED: 9.4 mg/dL
CALCIUM: 9.1 mg/dL (ref 8.5–10.1)
CHLORIDE: 105 mmol/L (ref 98–107)
CO2 TOTAL: 26 mmol/L (ref 21–32)
CREATININE: 1.29 mg/dL (ref 0.70–1.30)
ESTIMATED GFR: 59 mL/min/{1.73_m2} — ABNORMAL LOW (ref >59)
GLOBULIN: 3.6
GLUCOSE: 105 mg/dL (ref 74–106)
OSMOLALITY, CALCULATED: 287 mOsm/kg (ref 270–290)
POTASSIUM: 4.5 mmol/L (ref 3.5–5.1)
PROTEIN TOTAL: 7.2 g/dL (ref 6.4–8.2)
SODIUM: 142 mmol/L (ref 136–145)

## 2023-03-16 LAB — MAGNESIUM: MAGNESIUM: 1.8 mg/dL (ref 1.8–2.4)

## 2023-03-16 LAB — CBC WITH DIFF
BASOPHIL #: 0.01 10*3/uL (ref 0.00–0.30)
BASOPHIL %: 0 % (ref 0–3)
EOSINOPHIL #: 0.11 10*3/uL (ref 0.00–0.80)
EOSINOPHIL %: 3 % (ref 0–7)
HCT: 44.9 % (ref 42.0–51.0)
HGB: 14.9 g/dL (ref 13.5–18.0)
LYMPHOCYTE #: 1.67 10*3/uL (ref 1.10–5.00)
LYMPHOCYTE %: 38 % (ref 25–45)
MCH: 32.4 pg — ABNORMAL HIGH (ref 27.0–32.0)
MCHC: 33.2 g/dL (ref 32.0–36.0)
MCV: 97.6 fL (ref 78.0–99.0)
MONOCYTE #: 0.35 10*3/uL (ref 0.00–1.30)
MONOCYTE %: 8 % (ref 0–12)
MPV: 7.8 fL (ref 7.4–10.4)
NEUTROPHIL #: 2.3 10*3/uL (ref 1.80–8.40)
NEUTROPHIL %: 52 % (ref 40–76)
PLATELETS: 175 10*3/uL (ref 140–440)
RBC: 4.6 10*6/uL (ref 4.20–6.00)
RDW: 15.5 % — ABNORMAL HIGH (ref 11.6–14.8)
WBC: 4.5 10*3/uL (ref 4.0–10.5)

## 2023-03-16 LAB — D-DIMER: D-DIMER: <215 ng/mL DDU (ref <=232)

## 2023-03-16 LAB — PT/INR
INR: 1.5 — ABNORMAL HIGH (ref 0.88–1.10)
PROTHROMBIN TIME: 17.3 seconds — ABNORMAL HIGH (ref 9.8–12.7)

## 2023-03-16 LAB — TROPONIN-I
TROPONIN I: 9 ng/L (ref <20)
TROPONIN I: 19 ng/L (ref <20)
TROPONIN I: 41 ng/L — ABNORMAL HIGH (ref <20)
TROPONIN I: 46 ng/L — ABNORMAL HIGH (ref <20)
TROPONIN I: 32 ng/L — ABNORMAL HIGH (ref <20)

## 2023-03-16 LAB — ECG 12 LEAD
Atrial Rate: 74 {beats}/min
Calculated P Axis: 58 degrees
Calculated R Axis: 70 degrees
Calculated T Axis: 45 degrees
PR Interval: 174 ms
QRS Duration: 88 ms
QT Interval: 382 ms
QTC Calculation: 424 ms
Ventricular rate: 74 {beats}/min

## 2023-03-16 LAB — PTT (PARTIAL THROMBOPLASTIN TIME): APTT: 46.8 seconds — ABNORMAL HIGH (ref 22.4–31.7)

## 2023-03-16 MED ORDER — ASPIRIN 81 MG CHEWABLE TABLET
324.0000 mg | CHEWABLE_TABLET | ORAL | Status: AC
Start: 2023-03-16 — End: 2023-03-16
  Administered 2023-03-16: 324 mg via ORAL

## 2023-03-16 MED ORDER — HYDRALAZINE 20 MG/ML INJECTION SOLUTION
10.0000 mg | Freq: Four times a day (QID) | INTRAMUSCULAR | Status: DC | PRN
Start: 2023-03-16 — End: 2023-03-18

## 2023-03-16 MED ORDER — PROMETHAZINE 6.25 MG/5 ML ORAL SYRUP
6.2500 mg | ORAL_SOLUTION | Freq: Four times a day (QID) | ORAL | Status: DC | PRN
Start: 2023-03-16 — End: 2023-03-18
  Administered 2023-03-16 – 2023-03-18 (×6): 6.25 mg via ORAL
  Filled 2023-03-16 (×6): qty 5

## 2023-03-16 MED ORDER — PROMETHAZINE-DM 6.25 MG-15 MG/5 ML ORAL SYRUP
5.0000 mL | ORAL_SOLUTION | Freq: Four times a day (QID) | ORAL | Status: DC | PRN
Start: 2023-03-16 — End: 2023-03-16

## 2023-03-16 MED ORDER — MONTELUKAST 10 MG TABLET
10.0000 mg | ORAL_TABLET | Freq: Every day | ORAL | Status: DC | PRN
Start: 2023-03-17 — End: 2023-03-18
  Administered 2023-03-17 – 2023-03-18 (×2): 10 mg via ORAL
  Filled 2023-03-16 (×3): qty 1

## 2023-03-16 MED ORDER — LISINOPRIL 20 MG TABLET
20.0000 mg | ORAL_TABLET | Freq: Every day | ORAL | Status: DC
Start: 2023-03-16 — End: 2023-03-18
  Administered 2023-03-16 – 2023-03-17 (×2): 0 mg via ORAL
  Administered 2023-03-18: 20 mg via ORAL
  Filled 2023-03-16 (×3): qty 1

## 2023-03-16 MED ORDER — NITROGLYCERIN 0.4 MG SUBLINGUAL TABLET
SUBLINGUAL_TABLET | SUBLINGUAL | Status: AC
Start: 2023-03-16 — End: 2023-03-16
  Filled 2023-03-16: qty 1

## 2023-03-16 MED ORDER — CLONAZEPAM 1 MG TABLET
1.0000 mg | ORAL_TABLET | Freq: Every evening | ORAL | Status: DC | PRN
Start: 2023-03-16 — End: 2023-03-18
  Administered 2023-03-16 – 2023-03-17 (×2): 1 mg via ORAL
  Filled 2023-03-16 (×2): qty 1

## 2023-03-16 MED ORDER — NITROGLYCERIN 2 % TRANSDERMAL OINTMENT - PACKET
1.0000 [in_us] | TOPICAL_OINTMENT | TRANSDERMAL | Status: AC
Start: 2023-03-16 — End: 2023-03-16
  Administered 2023-03-16: 1 [in_us] via TOPICAL

## 2023-03-16 MED ORDER — VIBEGRON 75 MG TABLET
1.0000 | ORAL_TABLET | Freq: Every day | ORAL | Status: DC
Start: 2023-03-16 — End: 2023-03-18
  Administered 2023-03-16 – 2023-03-18 (×3): 0 mg via ORAL

## 2023-03-16 MED ORDER — ASPIRIN 81 MG CHEWABLE TABLET
CHEWABLE_TABLET | ORAL | Status: AC
Start: 2023-03-16 — End: 2023-03-16
  Filled 2023-03-16: qty 4

## 2023-03-16 MED ORDER — BACLOFEN 10 MG TABLET
10.0000 mg | ORAL_TABLET | Freq: Every day | ORAL | Status: DC | PRN
Start: 2023-03-16 — End: 2023-03-18

## 2023-03-16 MED ORDER — ACETAMINOPHEN 325 MG TABLET
650.0000 mg | ORAL_TABLET | ORAL | Status: DC | PRN
Start: 2023-03-16 — End: 2023-03-18
  Administered 2023-03-17 (×2): 650 mg via ORAL
  Filled 2023-03-16 (×2): qty 2

## 2023-03-16 MED ORDER — PANTOPRAZOLE 40 MG TABLET,DELAYED RELEASE
40.0000 mg | DELAYED_RELEASE_TABLET | Freq: Every day | ORAL | Status: DC
Start: 2023-03-16 — End: 2023-03-18
  Administered 2023-03-16: 0 mg via ORAL
  Administered 2023-03-17 – 2023-03-18 (×2): 40 mg via ORAL
  Filled 2023-03-16 (×3): qty 1

## 2023-03-16 MED ORDER — LORATADINE 10 MG TABLET
10.0000 mg | ORAL_TABLET | Freq: Every day | ORAL | Status: DC | PRN
Start: 2023-03-17 — End: 2023-03-18
  Administered 2023-03-17: 10 mg via ORAL
  Filled 2023-03-16: qty 1

## 2023-03-16 MED ORDER — MONTELUKAST 10 MG TABLET
10.0000 mg | ORAL_TABLET | Freq: Every day | ORAL | Status: DC | PRN
Start: 2023-03-16 — End: 2023-03-16
  Administered 2023-03-16: 10 mg via ORAL
  Filled 2023-03-16: qty 1

## 2023-03-16 MED ORDER — DILTIAZEM CD 240 MG CAPSULE,EXTENDED RELEASE 24 HR
240.0000 mg | ORAL_CAPSULE | Freq: Every morning | ORAL | Status: DC
Start: 2023-03-17 — End: 2023-03-18
  Administered 2023-03-17: 240 mg via ORAL
  Administered 2023-03-18: 0 mg via ORAL
  Filled 2023-03-16 (×2): qty 1

## 2023-03-16 MED ORDER — NITROGLYCERIN 0.4 MG SUBLINGUAL TABLET
0.4000 mg | SUBLINGUAL_TABLET | SUBLINGUAL | Status: AC
Start: 2023-03-16 — End: 2023-03-16
  Administered 2023-03-16: 0.4 mg via SUBLINGUAL

## 2023-03-16 MED ORDER — LINACLOTIDE 145 MCG CAPSULE
145.0000 ug | ORAL_CAPSULE | Freq: Every morning | ORAL | Status: DC
Start: 2023-03-17 — End: 2023-03-18
  Administered 2023-03-17 – 2023-03-18 (×2): 0 ug via ORAL

## 2023-03-16 MED ORDER — RIVAROXABAN 10 MG TABLET
20.0000 mg | ORAL_TABLET | Freq: Every evening | ORAL | Status: DC
Start: 2023-03-16 — End: 2023-03-18
  Administered 2023-03-16: 0 mg via ORAL
  Administered 2023-03-17 – 2023-03-18 (×2): 20 mg via ORAL
  Filled 2023-03-16 (×3): qty 2

## 2023-03-16 MED ORDER — NITROGLYCERIN 2 % TRANSDERMAL OINTMENT - PACKET
TOPICAL_OINTMENT | TRANSDERMAL | Status: AC
Start: 2023-03-16 — End: 2023-03-16
  Filled 2023-03-16: qty 1

## 2023-03-16 MED ORDER — ATORVASTATIN 10 MG TABLET
10.0000 mg | ORAL_TABLET | Freq: Every evening | ORAL | Status: DC
Start: 2023-03-16 — End: 2023-03-18
  Administered 2023-03-16 – 2023-03-17 (×2): 10 mg via ORAL
  Filled 2023-03-16 (×2): qty 1

## 2023-03-16 MED ORDER — ASPIRIN 81 MG CHEWABLE TABLET
81.0000 mg | CHEWABLE_TABLET | Freq: Every day | ORAL | Status: DC
Start: 2023-03-16 — End: 2023-03-18
  Administered 2023-03-16: 0 mg via ORAL
  Administered 2023-03-17 – 2023-03-18 (×2): 81 mg via ORAL
  Filled 2023-03-16 (×3): qty 1

## 2023-03-16 MED ORDER — IPRATROPIUM BROMIDE 42 MCG (0.06 %) NASAL SPRAY
2.0000 | Freq: Three times a day (TID) | NASAL | Status: DC
Start: 2023-03-16 — End: 2023-03-18
  Administered 2023-03-16 – 2023-03-18 (×6): 0 via NASAL

## 2023-03-16 MED ORDER — DEXTROMETHORPHAN-GUAIFENESIN 10 MG-100 MG/5 ML ORAL SYRUP
5.0000 mL | ORAL_SOLUTION | Freq: Four times a day (QID) | ORAL | Status: DC | PRN
Start: 2023-03-16 — End: 2023-03-18

## 2023-03-16 MED ORDER — SODIUM CHLORIDE 0.9 % IV BOLUS
500.0000 mL | INJECTION | Status: AC
Start: 2023-03-16 — End: 2023-03-16
  Administered 2023-03-16: 0 mL via INTRAVENOUS
  Administered 2023-03-16: 500 mL via INTRAVENOUS

## 2023-03-16 MED ORDER — LORATADINE 10 MG TABLET
10.0000 mg | ORAL_TABLET | Freq: Every day | ORAL | Status: DC | PRN
Start: 2023-03-16 — End: 2023-03-16

## 2023-03-16 NOTE — ED Notes (Signed)
[facility] contacted to transport patient to main campus at this time

## 2023-03-16 NOTE — H&P (Signed)
[facility]    HOSPITALIST H&P    ARK AGRUSA 72 y.o. male 432/B   Date of Service: 03/16/2023    Date of Admission:  03/16/2023   PCP: Weyman Pedro, DO Code Status:Full Code       History Obtained From: Patient  Chief Complaint:  " Chest pain "    HPI:   Ricky Grimes is a 71 y.o., White male who presented to the [facility] ED with chest pain. Patient reports he had an episode of chest pain which he describes as chest pressure with associated diaphoresis last night. Reports the episode occurred at rest, lasted a few minutes, and resolved with a baby aspirin and a blood pressure medication. This morning patient experienced similar episode after taking his morning medications with pain/tichtenss on both sides of his chest. Denies nausea, vomiting, abdominal pain, headache, shortness of breath, and palpitations. He is a non-smoker. Denies history of heart attacks in the past. He does have a history of atrial fibrillation for which he takes Xarelto and Cardizem. Upon examination, patient is asymptomatic and chest pain free.     Upon presentation to the ED, patient was hypertensive with SBP in the 200's. Vital signs otherwise stable. CBC, electrolytes, renal function, and LFTs within normal limits. Initial EKG showed sinus rhythm with PVCs. CXR negative. Troponin trended 08/18/40/46. Repeat EKG showed normal sinus rhythm without ST-T wave changes.       ED medications:   Medications Administered in the ED   aspirin chewable tablet 324 mg (324 mg Oral Given 03/16/23 0910)   NS bolus infusion 500 mL (0 mL Intravenous Stopped 03/16/23 1100)   nitroGLYCERIN (NITROSTAT) sublingual tablet (0.4 mg Sublingual Given 03/16/23 0912)   nitroGLYCERIN (NITRO-BID) 2 % topical ointment (1 Inch Apply Topically Given 03/16/23 0912)         PMHx:    Past Medical History:   Diagnosis Date    Anticoagulant long-term use     Anxiety     Atrial fibrillation (CMS HCC)     Hypercholesterolemia     Hypertension      Migraine     Rectal bleeding     S/P rotator cuff repair     Wears glasses         PSHx:   Past Surgical History:   Procedure Laterality Date    KNEE ARTHROSCOPY Left           Allergies:    Allergies   Allergen Reactions    Metoclopramide  Other Adverse Reaction (Add comment)     Shakes unable to be still    Social History  Social History     Tobacco Use    Smoking status: Never    Smokeless tobacco: Never   Vaping Use    Vaping status: Never Used   Substance Use Topics    Alcohol use: Yes     Alcohol/week: 12.0 standard drinks of alcohol     Types: 12 Cans of beer per week     Comment: per week    Drug use: Never       Family History  Family Medical History:       Problem Relation (Age of Onset)    Atrial fibrillation Father    Cancer Father    No Known Problems Mother, Sister, Brother, Half-Sister, Half-Brother, Maternal Aunt, Maternal Uncle, Paternal Aunt, Paternal Uncle, Maternal Grandmother, Maternal Grandfather, Paternal Grandmother, Paternal Grandfather, Daughter, Son  Home Meds:      Prior to Admission medications    Medication Sig Start Date End Date Taking? Authorizing Provider   aspirin (ECOTRIN) 81 mg Oral Tablet, Delayed Release (E.C.) take 1 tablet every day for afib    Provider, Historical   baclofen (LIORESAL) 10 mg Oral Tablet Take 1 Tablet (10 mg total) by mouth Once a day 12/22/22 12/22/23  Weyman Pedro, DO   Butenafine 1 % Cream Apply topically Topical twice daily    Provider, Historical   cholecalciferol, vitamin D3, 100 mcg (4,000 unit) Oral Tablet Take 1 Tablet (4,000 Units total) by mouth Once a day for 90 days 04/09/22   Weyman Pedro, DO   clonazePAM (KLONOPIN) 1 mg Oral Tablet Take 1 Tablet (1 mg total) by mouth Every night as needed (and occassionally for panic attacks.) Indications: Anxiety and sleep aid. 12/22/22 12/22/23  Weyman Pedro, DO   dilTIAZem (CARDIZEM CD) 240 mg Oral Capsule, Sust. Release 24 hr Take 1 Capsule (240 mg total) by mouth Every morning  12/22/22 12/22/23  Weyman Pedro, DO   ipratropium bromide (ATROVENT) 42 mcg (0.06 %) Nasal Spray, Non-Aerosol Administer 2 Sprays into affected nostril(s) Three times a day 02/16/23   Elnora Morrison, FNP-BC   linaCL[facility]) 145 mcg Oral Capsule Take 1 Capsule (145 mcg total) by mouth Every morning Indications: irritable bowel syndrome with constipation 12/22/22 12/22/23  Weyman Pedro, DO   lisinopriL (PRINIVIL) 20 mg Oral Tablet Take 1 Tablet (20 mg total) by mouth Once a day 12/22/22 12/22/23  Weyman Pedro, DO   loratadine (CLARITIN) 10 mg Oral Tablet Take 1 Tablet (10 mg total) by mouth Once per day as needed    Provider, Historical   montelukast (SINGULAIR) 10 mg Oral Tablet Take 1 Tablet (10 mg total) by mouth Once per day as needed    Provider, Historical   NALOXONE 4 MG/ACTUATION NASAL SPRAY - ED TO GO 1 Spray by INTRANASAL route Every 2 minutes as needed for actual or suspected opioid overdose. Call 911 if used.    Provider, Historical   nitroGLYCERIN (NITROSTAT) 0.4 mg Sublingual Tablet, Sublingual Place 1 Tablet (0.4 mg total) under the tongue Every 5 minutes as needed for Chest pain for 3 doses over 15 minutes    Provider, Historical   pantoprazole (PROTONIX) 40 mg Oral Tablet, Delayed Release (E.C.) Take 1 Tablet (40 mg total) by mouth Once a day 12/22/22   Weyman Pedro, DO   promethazine-dextromethorphan (PHENERGAN-DM) 6.25-15 mg/5 mL Oral Syrup Take 5 mL by mouth Four times a day as needed for Cough 02/25/23   Bowling, Ladonna, DO   rimegepant (NURTEC ODT) 75 mg Oral Tablet, Rapid Dissolve Place 1 Tablet (75 mg total) under the tongue Once per day as needed for Other (Migraines) 12/22/22   Weyman Pedro, DO   rivaroxaban (XARELTO) 20 mg Oral Tablet Take 1 Tablet (20 mg total) by mouth Once a day for 90 days Indications: treatment to prevent blood clots in chronic atrial fibrillation 12/22/22 03/22/23  Weyman Pedro, DO   rosuvastatin (CRESTOR) 10 mg Oral Tablet Take 1 Tablet (10 mg  total) by mouth Every evening 12/22/22 12/22/23  Weyman Pedro, DO   sodium chloride-aloe vera (AYR SALINE) Gel Apply to each nostril Four times a day as needed 12/22/22   Weyman Pedro, DO   sucralfate (CARAFATE) 1 gram Oral Tablet Take 1 Tablet (1 g total) by mouth Every 6 hours 12/22/22   Weyman Pedro, DO   tamsulosin (FLOMAX) 0.4 mg  Oral Capsule Take 1 Capsule (0.4 mg total) by mouth Every evening after dinner for 90 days 04/09/22   Weyman Pedro, DO   vibegron (GEMTESA) 75 mg Oral Tablet Take 1 Tablet (75 mg total) by mouth Once a day 12/22/22 12/22/23  Weyman Pedro, DO   ipratropium bromide (ATROVENT) 21 mcg (0.03 %) Nasal nasal spray Administer 2 Sprays into affected nostril(s) Three times a day 01/19/23 02/16/23  Elnora Morrison, FNP-BC   promethazine-dextromethorphan (PHENERGAN-DM) 6.25-15 mg/5 mL Oral Syrup Take 5 mL by mouth Four times a day as needed for Cough 12/24/22 02/25/23  Bowling, Donnella Sham, DO          ROS:   Review of Systems   Constitutional: Positive for diaphoresis. Negative for chills and fever.   Cardiovascular:  Positive for chest pain. Negative for dyspnea on exertion, irregular heartbeat, leg swelling and palpitations.   Respiratory:  Negative for shortness of breath.    Gastrointestinal:  Negative for abdominal pain, nausea and vomiting.   Neurological:  Negative for dizziness, focal weakness, headaches, light-headedness and weakness.          Physical:  Filed Vitals:    03/16/23 1630 03/16/23 1645 03/16/23 1741 03/16/23 1748   BP: (!) 118/59 120/62  133/63   Pulse: 58 73 73 65   Resp: 18 17  18    Temp:    36.3 C (97.4 F)   SpO2: 96% 97%  98%      Physical Exam  Constitutional:       General: He is not in acute distress.     Appearance: He is not ill-appearing.   HENT:      Mouth/Throat:      Mouth: Mucous membranes are moist.      Pharynx: Oropharynx is clear.   Eyes:      Extraocular Movements: Extraocular movements intact.      Conjunctiva/sclera: Conjunctivae normal.       Pupils: Pupils are equal, round, and reactive to light.   Cardiovascular:      Rate and Rhythm: Normal rate and regular rhythm.      Pulses: Normal pulses.      Heart sounds: Murmur heard.      No friction rub. No gallop.   Pulmonary:      Effort: Pulmonary effort is normal.      Breath sounds: Normal breath sounds.   Abdominal:      General: Abdomen is flat. Bowel sounds are normal. There is no distension.      Palpations: Abdomen is soft.      Tenderness: There is no abdominal tenderness. There is no guarding.   Musculoskeletal:      Right lower leg: No edema.      Left lower leg: No edema.   Skin:     General: Skin is warm and dry.   Neurological:      General: No focal deficit present.      Mental Status: He is alert and oriented to person, place, and time.            Diagnostic studies:  Results for orders placed or performed during the hospital encounter of 03/16/23 (from the past 24 hour(s))   ECG 12 LEAD   Result Value Ref Range    Ventricular rate 74 BPM    Atrial Rate 74 BPM    PR Interval 174 ms    QRS Duration 88 ms    QT Interval 382 ms    QTC Calculation 424  ms    Calculated P Axis 58 degrees    Calculated R Axis 70 degrees    Calculated T Axis 45 degrees   COMPREHENSIVE METABOLIC PANEL, NON-FASTING   Result Value Ref Range    SODIUM 142 136 - 145 mmol/L    POTASSIUM 4.5 3.5 - 5.1 mmol/L    CHLORIDE 105 98 - 107 mmol/L    CO2 TOTAL 26 21 - 32 mmol/L    ANION GAP 11 4 - 13 mmol/L    BUN 22 (H) 7 - 18 mg/dL    CREATININE 8.11 9.14 - 1.30 mg/dL    BUN/CREA RATIO 17     ESTIMATED GFR 59 (L) >59 mL/min/1.32m^2    ALBUMIN 3.6 3.4 - 5.0 g/dL    CALCIUM 9.1 8.5 - 78.2 mg/dL    GLUCOSE 956 74 - 213 mg/dL    ALKALINE PHOSPHATASE 63 46 - 116 U/L    ALT (SGPT) 27 <=78 U/L    AST (SGOT) 25 15 - 37 U/L    BILIRUBIN TOTAL 0.3 0.2 - 1.0 mg/dL    PROTEIN TOTAL 7.2 6.4 - 8.2 g/dL    ALBUMIN/GLOBULIN RATIO 1.0 0.8 - 1.4    OSMOLALITY, CALCULATED 287 270 - 290 mOsm/kg    CALCIUM, CORRECTED 9.4 mg/dL    GLOBULIN 3.6     TROPONIN-I NOW   Result Value Ref Range    TROPONIN I 9 <20 ng/L   D-DIMER   Result Value Ref Range    D-DIMER <215 <=232 ng/mL DDU   MAGNESIUM   Result Value Ref Range    MAGNESIUM 1.8 1.8 - 2.4 mg/dL   PT/INR   Result Value Ref Range    PROTHROMBIN TIME 17.3 (H) 9.8 - 12.7 seconds    INR 1.50 (H) 0.88 - 1.10   PTT (PARTIAL THROMBOPLASTIN TIME)   Result Value Ref Range    APTT 46.8 (H) 22.4 - 31.7 seconds   CBC WITH DIFF   Result Value Ref Range    WBC 4.5 4.0 - 10.5 x10^3/uL    RBC 4.60 4.20 - 6.00 x10^6/uL    HGB 14.9 13.5 - 18.0 g/dL    HCT 08.6 57.8 - 46.9 %    MCV 97.6 78.0 - 99.0 fL    MCH 32.4 (H) 27.0 - 32.0 pg    MCHC 33.2 32.0 - 36.0 g/dL    RDW 62.9 (H) 52.8 - 14.8 %    PLATELETS 175 140 - 440 x10^3/uL    MPV 7.8 7.4 - 10.4 fL    NEUTROPHIL % 52 40 - 76 %    LYMPHOCYTE % 38 25 - 45 %    MONOCYTE % 8 0 - 12 %    EOSINOPHIL % 3 0 - 7 %    BASOPHIL % 0 0 - 3 %    NEUTROPHIL # 2.30 1.80 - 8.40 x10^3/uL    LYMPHOCYTE # 1.67 1.10 - 5.00 x10^3/uL    MONOCYTE # 0.35 0.00 - 1.30 x10^3/uL    EOSINOPHIL # 0.11 0.00 - 0.80 x10^3/uL    BASOPHIL # 0.01 0.00 - 0.30 x10^3/uL   URINALYSIS, MACRO/MICRO   Result Value Ref Range    COLOR Light Yellow (A) Yellow    APPEARANCE Clear Clear    SPECIFIC GRAVITY 1.025 1.003 - 1.035    PH 6.0 4.6 - 8.0    LEUKOCYTES Negative Negative WBCs/uL    NITRITE Negative Negative    PROTEIN Negative Negative mg/dL    GLUCOSE Negative  Negative mg/dL    KETONES Negative Negative mg/dL    BILIRUBIN Negative Negative mg/dL    BLOOD Negative Negative mg/dL    UROBILINOGEN 0.2 0.2 - 1.0 mg/dL   TROPONIN-I IN ONE HOUR   Result Value Ref Range    TROPONIN I 19 <20 ng/L   TROPONIN-I IN THREE HOURS   Result Value Ref Range    TROPONIN I 41 (H) <20 ng/L   ECG 12 LEAD   Result Value Ref Range    Ventricular rate 63 BPM    Atrial Rate 63 BPM    PR Interval 174 ms    QRS Duration 84 ms    QT Interval 408 ms    QTC Calculation 417 ms    Calculated P Axis 34 degrees    Calculated R Axis 46 degrees     Calculated T Axis 39 degrees   TROPONIN-I   Result Value Ref Range    TROPONIN I 46 (H) <20 ng/L         EKG interpretation:   Normal sinus rhythm. No ST-T wave changes.       Assessments/Plan:  Active Hospital Problems   (*Primary Problem)    Diagnosis    *NSTEMI (non-ST elevated myocardial infarction) (CMS HCC)     Elevated troponin  Troponins 08/18/40/46, continue trend. CXR negative. EKG shows no changes. Low suspicion ACS  Telemetry monitoring. Echocardiogram . Cardiology consult.  Continue home aspirin and statin therapy.   Atrial fibrillation  Chronic. Currently rate and rhythm controlled. Continue home medications with Xarelto and Cardizem.   Hypertensive urgency  BP 213/79 on presentation to ED. Continue home lisinopril. Hydralazine PRN.   Murmur  S1 and S2, echo ordered  Other  Continue home medications as appropriate.      Further evaluation and management depending upon hospital course.    The Hospitalist personally evaluated and examined the patient in conjunction with the MLP and agree with the assessments, treatment plan and disposition of the patient as recorded by the [facility].      Code status: Full Code  DVT prophylaxis:Rivaroxaban   Diet: DIET REGULAR Do you want to initiate MNT Protocol? Yes    Disposition:  The patient is currently acutely ill requiring treatment on the medical floor for NSTEMI. Patient will be closely evaluated and monitored.        Collier Salina, PA-C    [facility] MEDICINE HOSPITALIST

## 2023-03-16 NOTE — ED Nurses Note (Signed)
Report called to Deirdre RN at [facility]. All questions  answered. Patient being transported to room 432 by [employer]. All personal belongings taken with patient. S/o is at bedside and aware of transfer. SL intact to left AC. Denies chest pain at this time. No problems noted upon leaving unit.

## 2023-03-16 NOTE — ED Triage Notes (Signed)
Patient states started last night but took a baby aspirin and went away but then came back this morning. Pain is all the way across chest but eased off when he got here.  States did take a baby ASA this morning.  Denies pain radiates. States he is congested and has a lot of drainage.  States he is concerned might be his lungs.  States he gets blood when blowing his nose.

## 2023-03-16 NOTE — Care Plan (Signed)
Problem: Adult Inpatient Plan of Care  Goal: Plan of Care Review  Outcome: Ongoing (see interventions/notes)  Goal: Patient-Specific Goal (Individualized)  Outcome: Ongoing (see interventions/notes)  Flowsheets (Taken 03/16/2023 1700)  Individualized Care Needs: Monitgr lab  Anxieties, Fears or Concerns: Nothing really, maybe concerned over the type of care he needs  Patient-Specific Goals (Include Timeframe): whatever is wrong to get fixed  Plan of Care Reviewed With: patient  Goal: Absence of Hospital-Acquired Illness or Injury  Outcome: Ongoing (see interventions/notes)  Goal: Optimal Comfort and Wellbeing  Outcome: Ongoing (see interventions/notes)  Goal: Rounds/Family Conference  Outcome: Ongoing (see interventions/notes)     Problem: Pain Acute  Goal: Optimal Pain Control and Function  Outcome: Ongoing (see interventions/notes)     Problem: Health Knowledge, Opportunity to Enhance (Adult,Obstetrics,Pediatric)  Goal: Knowledgeable about Health Subject/Topic  Description: Patient will demonstrate the desired outcomes by discharge/transition of care.  Outcome: Ongoing (see interventions/notes)     Problem: Chest Pain  Goal: Resolution of Chest Pain Symptoms  Outcome: Ongoing (see interventions/notes)

## 2023-03-16 NOTE — ED Provider Notes (Signed)
[facility], [facility] Emergency Department  ED Primary Provider Note  History of Present Illness   Chief Complaint   Patient presents with    Chest Pain      Ricky Grimes is a 72 y.o. male who had concerns including Chest Pain .  Arrival: The patient arrived by Car complaining of midsternal chest pressure which started last night and then has gotten worse this morning.  He denies any radiation of the pressure to his neck or his left arm.  He denies any shortness of breath with it.  Patient states he got diaphoretic last night but not this morning.  He denies any nausea or vomiting.  Patient denies any previous chest pain.  Patient does have a history of hypertension as well as hypercholesterolemia.  Patient also has a history of AFib and is presently on Eliquis.  Patient took a baby aspirin this morning.  Denies any diabetes or smoking history.  He denies any family history of CAD.  Pain is not reproducible with palpation or movement.  Patient keeps complaining of having congestion and nasal congestion with postnasal drip.  Patient has been coughing nonproductive.    HPI  Review of Systems   Review of Systems   Constitutional:  Positive for activity change and appetite change. Negative for chills and fever.   HENT:  Positive for congestion, postnasal drip and rhinorrhea. Negative for ear pain and sore throat.    Eyes:  Negative for pain and visual disturbance.   Respiratory:  Positive for chest tightness. Negative for cough.    Cardiovascular:  Positive for chest pain. Negative for palpitations.   Gastrointestinal:  Negative for abdominal pain and vomiting.   Genitourinary:  Negative for dysuria and hematuria.   Musculoskeletal:  Negative for arthralgias and back pain.   Skin:  Negative for color change and rash.   Neurological:  Negative for seizures and syncope.   All other systems reviewed and are negative.     Historical Data   History Reviewed This Encounter:     Physical  Exam   ED Triage Vitals   BP (Non-Invasive) 03/16/23 0900 (!) 213/79   Heart Rate 03/16/23 0900 80   Respiratory Rate 03/16/23 0900 14   Temperature 03/16/23 0911 36.3 C (97.4 F)   SpO2 03/16/23 0905 99 %   Weight 03/16/23 0905 74.8 kg (165 lb)   Height 03/16/23 0905 1.727 m (5\' 8" )     Physical Exam  Vitals and nursing note reviewed.   Constitutional:       General: He is not in acute distress.     Appearance: He is well-developed. He is obese.   HENT:      Head: Normocephalic and atraumatic.      Right Ear: External ear normal.      Left Ear: External ear normal.      Nose: Nose normal.      Mouth/Throat:      Mouth: Mucous membranes are dry.   Eyes:      Extraocular Movements: Extraocular movements intact.      Conjunctiva/sclera: Conjunctivae normal.      Pupils: Pupils are equal, round, and reactive to light.   Cardiovascular:      Rate and Rhythm: Normal rate and regular rhythm.      Pulses: Normal pulses.      Heart sounds: Normal heart sounds. No murmur heard.  Pulmonary:      Effort: Pulmonary effort is normal. No respiratory  distress.      Breath sounds: Normal breath sounds.   Abdominal:      General: Bowel sounds are normal.      Palpations: Abdomen is soft.      Tenderness: There is no abdominal tenderness.   Musculoskeletal:         General: No swelling. Normal range of motion.      Cervical back: Normal range of motion and neck supple.   Skin:     General: Skin is warm and dry.      Capillary Refill: Capillary refill takes less than 2 seconds.   Neurological:      General: No focal deficit present.      Mental Status: He is alert and oriented to person, place, and time.   Psychiatric:         Mood and Affect: Mood normal.         Thought Content: Thought content normal.         Judgment: Judgment normal.       Patient Data     Labs Ordered/Reviewed   COMPREHENSIVE METABOLIC PANEL, NON-FASTING - Abnormal; Notable for the following components:       Result Value    BUN 22 (*)     ESTIMATED GFR 59 (*)      All other components within normal limits    Narrative:     Estimated Glomerular Filtration Rate (eGFR) is calculated using the CKD-EPI (2021) equation, intended for patients 1 years of age and older. If gender is not documented or "unknown", there will be no eGFR calculation.   TROPONIN-I - Abnormal; Notable for the following components:    TROPONIN I 41 (*)     All other components within normal limits    Narrative:     Values received on females ranging between 12-15 ng/L MUST include the next serial troponin to review changes in the delta differences as the reference range for the Access II chemistry analyzer is lower than the established reference range.     PT/INR - Abnormal; Notable for the following components:    PROTHROMBIN TIME 17.3 (*)     INR 1.50 (*)     All other components within normal limits    Narrative:     INR OF 2.0-3.0  RECOMMENDED FOR: PROPHYLAXIS/TREATMENT OF VENEOUS THROMBOSIS, PULMONARY EMBOLISM, PREVENTION OF SYSTEMIC EMBOLISM FROM ATRIAL FIBRILATION, MYOCARDIAL INFARCTION.    INR OF 2.5-3.5  RECOMMENDED FOR MECHANICAL PROSTHETIC HEART VALVES, RECURRENT SYSTEMIC EMBOLISM, RECURRENT MYOCARDIAL INFARCTION.     PTT (PARTIAL THROMBOPLASTIN TIME) - Abnormal; Notable for the following components:    APTT 46.8 (*)     All other components within normal limits   CBC WITH DIFF - Abnormal; Notable for the following components:    MCH 32.4 (*)     RDW 15.5 (*)     All other components within normal limits   URINALYSIS, MACRO/MICRO - Abnormal; Notable for the following components:    COLOR Light Yellow (*)     All other components within normal limits   TROPONIN-I - Abnormal; Notable for the following components:    TROPONIN I 46 (*)     All other components within normal limits    Narrative:     Values received on females ranging between 12-15 ng/L MUST include the next serial troponin to review changes in the delta differences as the reference range for the Access II chemistry analyzer is lower  than the established reference range.  TROPONIN-I - Normal    Narrative:     Values received on females ranging between 12-15 ng/L MUST include the next serial troponin to review changes in the delta differences as the reference range for the Access II chemistry analyzer is lower than the established reference range.     TROPONIN-I - Normal    Narrative:     Values received on females ranging between 12-15 ng/L MUST include the next serial troponin to review changes in the delta differences as the reference range for the Access II chemistry analyzer is lower than the established reference range.     D-DIMER - Normal   MAGNESIUM - Normal   CBC/DIFF    Narrative:     The following orders were created for panel order CBC/DIFF.  Procedure                               Abnormality         Status                     ---------                               -----------         ------                     CBC WITH ZOXW[[id number]]                Abnormal            Final result                 Please view results for these tests on the individual orders.   URINALYSIS WITH REFLEX MICROSCOPIC AND CULTURE IF POSITIVE    Narrative:     The following orders were created for panel order URINALYSIS WITH REFLEX MICROSCOPIC AND CULTURE IF POSITIVE.  Procedure                               Abnormality         Status                     ---------                               -----------         ------                     URINALYSIS, MACRO/MICRO[[id number]]      Abnormal            Final result                 Please view results for these tests on the individual orders.     XR AP MOBILE CHEST   Final Result by Edi, Radresults In (04/16 1191)   NO ACUTE FINDINGS.            Radiologist location ID: [id number]           Medical Decision Making        Medical Decision Making  Patient is 72 year old white male complaining of midsternal chest heaviness nonradiating which started last night.  Patient denies any  shortness of breath with  it.  He states being diaphoretic last night but not this morning.  Patient is concerned that he is very congested nasally as well as chest.  He thinks it is his allergies.  Patient does have risk factors of hypertension as well as high cholesterol.  He denies any diabetes or smoking history.  He also denies any family history of CAD.  Patient does have a history of AFib in his presently on Eliquis.  Patient will have chest pain workup with an IV.  Patient will also have labs and a chest x-ray.  Patient was given sublingual nitro as well as an inch of paste.  Patient was also given a full aspirin.  Patient will have serial troponins done x3.  Patient's differential includes angina, atypical chest pain, bronchitis, upper respiratory tract infection.      I spoke to Dr. Cliffton Asters, hospitalist at [facility] who accepted the patient for transfer and admission to their facility.  He wanted a repeat EKG just to make sure he does not change his EKG from prior.    Amount and/or Complexity of Data Reviewed  Labs: ordered.  Radiology: ordered.  ECG/medicine tests: ordered.     Details: Normal sinus rhythm 74, PR interval 174 MS, QT interval 382 MS, occasional PVCs, flat T-waves in 3 and AVF.    Repeat EKG; normal sinus rhythm 63, PR interval 174 MS, QT interval 408 MS, flat T-waves 3, AVF.  Poor R-wave progression through the precordium.    Risk  OTC drugs.  Prescription drug management.  Decision regarding hospitalization.             Medications Administered in the ED   aspirin chewable tablet 324 mg (324 mg Oral Given 03/16/23 0910)   NS bolus infusion 500 mL (0 mL Intravenous Stopped 03/16/23 1100)   nitroGLYCERIN (NITROSTAT) sublingual tablet (0.4 mg Sublingual Given 03/16/23 0912)   nitroGLYCERIN (NITRO-BID) 2 % topical ointment (1 Inch Apply Topically Given 03/16/23 0912)     Clinical Impression   Chest pain, unspecified type (Primary)   Elevated troponin   Accelerated hypertension       Disposition:  Admitted               Clinical Impression   Chest pain, unspecified type (Primary)   Elevated troponin   Accelerated hypertension       Current Discharge Medication List

## 2023-03-16 NOTE — Nurses Notes (Signed)
Pt arrived to floor admitted for chest pain,  States started last night and he thought he had just gotten overheated,  took his b/p med and asa and went to bed, awoke and had coffee took meds and had pressure again.  States went to the ER and he had no more pain today.   Denies pain at this time, Aox3.  Call bell in reach and oriented to room.

## 2023-03-17 DIAGNOSIS — I21A1 Myocardial infarction type 2: Secondary | ICD-10-CM

## 2023-03-17 DIAGNOSIS — I498 Other specified cardiac arrhythmias: Secondary | ICD-10-CM

## 2023-03-17 DIAGNOSIS — I480 Paroxysmal atrial fibrillation: Secondary | ICD-10-CM

## 2023-03-17 DIAGNOSIS — R7989 Other specified abnormal findings of blood chemistry: Secondary | ICD-10-CM

## 2023-03-17 LAB — TROPONIN-I
TROPONIN I: 22 ng/L — ABNORMAL HIGH (ref <20)
TROPONIN I: 15 ng/L (ref <20)

## 2023-03-17 LAB — ECG 12 LEAD
Atrial Rate: 63 {beats}/min
Calculated P Axis: 34 degrees
Calculated R Axis: 46 degrees
Calculated T Axis: 39 degrees
PR Interval: 174 ms
QRS Duration: 84 ms
QT Interval: 408 ms
QTC Calculation: 417 ms
Ventricular rate: 63 {beats}/min

## 2023-03-17 NOTE — Progress Notes (Signed)
[facility]               IP PROGRESS NOTE      Ricky Grimes, Ricky Grimes  Date of Admission:  03/16/2023  Date of Birth:  [birth year]  Date of Service:  03/17/2023    Hospital Day:  LOS: 0 days     Subjective:   Patient lying in bed this am. Denies any complaints, chest pain. States he feels fine. Thinks his 4 drinks raised his blood pressure yesterday.     Vital Signs:  Temp (24hrs) Max:37 C (98.6 F)      Temperature: 36.5 C (97.7 F)  BP (Non-Invasive): (!) 102/54  MAP (Non-Invasive): 67 mmHG  Heart Rate: 54  Respiratory Rate: 18  SpO2: 95 %    Current Medications:  acetaminophen (TYLENOL) tablet, 650 mg, Oral, Q4H PRN  aspirin chewable tablet 81 mg, 81 mg, Oral, Daily  atorvastatin (LIPITOR) tablet, 10 mg, Oral, QPM  baclofen (LIORESAL) tablet, 10 mg, Oral, Daily PRN  clonazePAM (klonoPIN) tablet, 1 mg, Oral, HS PRN  promethazine (PHENERGAN) 6.25mg  per 5mL oral liquid, 6.25 mg, Oral, 4x/day PRN   And  dextromethorphan-guaiFENesin (ROBITUSSIN DM) 10-100mg  per 5mL oral liquid, 5 mL, Oral, 4x/day PRN  dilTIAZem (CARDIZEM CD) 24 hr extended release capsule, 240 mg, Oral, QAM  hydrALAZINE (APRESOLINE) injection 10 mg, 10 mg, Intravenous, Q6H PRN  ipratropium (ATROVENT) 0.06% nasal spray, 2 Spray, Nasal, 3x/day  linaclotide (LINZESS) capsule, 145 mcg, Oral, QAM  lisinopril (PRINIVIL) tablet, 20 mg, Oral, Daily  loratadine (CLARITIN) tablet, 10 mg, Oral, Daily PRN  montelukast (SINGULAIR) 10 mg tablet, 10 mg, Oral, Daily PRN  pantoprazole (PROTONIX) delayed release tablet, 40 mg, Oral, Daily  rivaroxaban (XARELTO) tablet, 20 mg, Oral, Daily with Dinner  vibegron Tablet 75 mg, 1 Tablet, Oral, Daily    Current Orders:  Active Orders   Lab    BASIC METABOLIC PANEL     Frequency: 1478 - AM DRAW     Number of Occurrences: 1 Occurrences    CBC     Frequency: 0530 - AM DRAW     Number of Occurrences: 1 Occurrences   Diet    DIET REGULAR Do you want to initiate MNT Protocol? Yes     Frequency: All Meals     Number of  Occurrences: 1 Occurrences   Nursing    ACTIVITY Activity: AS TOLERATED; Instructions: WITH ASSIST     Frequency: UNTIL DISCONTINUED     Number of Occurrences: Until Specified    CONTINUOUS CARDIAC MONITORING (ED USE ONLY)     Frequency: ONE TIME     Number of Occurrences: 1 Occurrences    Notify MD Vital Signs     Frequency: PRN     Number of Occurrences: Until Specified    PT IS HIGH RISK FOR VENOUS THROMBOEMBOLISM     Frequency: CONTINUOUS     Number of Occurrences: Until Specified    PULSE OXIMETRY CONTINUOUS     Frequency: CONTINUOUS     Number of Occurrences: Until Specified    PULSE OXIMETRY Q4H     Frequency: Q4H     Number of Occurrences: Until Specified    TELEMETRY MONITORING X 48H     Frequency: CONTINUOUS X 48 HRS     Number of Occurrences: 48 Hours    VITAL SIGNS  Q4H     Frequency: Q4H     Number of Occurrences: Until Specified   Code Status    FULL CODE  Frequency: CONTINUOUS     Number of Occurrences: Until Specified   Consult    IP CONSULT TO [facility] - TELEMEDICINE     Frequency: ONE TIME     Number of Occurrences: 1 Occurrences   ECHO    TRANSTHORACIC ECHOCARDIOGRAM - ADULT     Frequency: ONE TIME     Number of Occurrences: 1 Occurrences     Scheduling Instructions:               IV    INSERT & MAINTAIN PERIPHERAL IV ACCESS     Frequency: UNTIL DISCONTINUED     Number of Occurrences: Until Specified   Discharge    DISCHARGE PATIENT     Frequency: ONE TIME     Number of Occurrences: 1 Occurrences     Order Comments: Discharge home if echocardiogram normal per cardiology. Call hospitalist with results.   Follow up with PCP in 1week.   Follow up with dr. Renda Rolls in 1 week.     Medications    acetaminophen (TYLENOL) tablet     Frequency: Q4H PRN     Dose: 650 mg     Route: Oral    aspirin chewable tablet 81 mg     Frequency: Daily     Dose: 81 mg     Route: Oral    atorvastatin (LIPITOR) tablet     Frequency: QPM     Dose: 10 mg     Route: Oral    baclofen (LIORESAL) tablet     Frequency:  Daily PRN     Dose: 10 mg     Route: Oral    clonazePAM (klonoPIN) tablet     Frequency: HS PRN     Dose: 1 mg     Route: Oral    dextromethorphan-guaiFENesin (ROBITUSSIN DM) 10-100mg  per 5mL oral liquid     Linked Order: And     Frequency: 4x/day PRN     Dose: 5 mL     Route: Oral    dilTIAZem (CARDIZEM CD) 24 hr extended release capsule     Frequency: QAM     Dose: 240 mg     Route: Oral    hydrALAZINE (APRESOLINE) injection 10 mg     Frequency: Q6H PRN     Dose: 10 mg     Route: Intravenous    ipratropium (ATROVENT) 0.06% nasal spray     Frequency: 3x/day     Dose: 2 Spray     Route: Nasal    linaclotide (LINZESS) capsule     Frequency: QAM     Dose: 145 mcg     Route: Oral    lisinopril (PRINIVIL) tablet     Frequency: Daily     Dose: 20 mg     Route: Oral    loratadine (CLARITIN) tablet     Frequency: Daily PRN     Dose: 10 mg     Route: Oral    montelukast (SINGULAIR) 10 mg tablet     Frequency: Daily PRN     Dose: 10 mg     Route: Oral    pantoprazole (PROTONIX) delayed release tablet     Frequency: Daily     Dose: 40 mg     Route: Oral    promethazine (PHENERGAN) 6.25mg  per 5mL oral liquid     Linked Order: And     Frequency: 4x/day PRN     Dose: 6.25 mg     Route: Oral  rivaroxaban (XARELTO) tablet     Frequency: Daily with Dinner     Dose: 20 mg     Route: Oral    vibegron Tablet 75 mg     Frequency: Daily     Dose: 1 Tablet     Route: Oral      Review of Systems:  Focused review of system was completed. Refer to the HPI for ROS details.     Today's Physical Exam:  Physical Exam  Constitutional:       General: He is not in acute distress.  Cardiovascular:      Rate and Rhythm: Normal rate and regular rhythm.      Heart sounds: Murmur heard.   Pulmonary:      Effort: Pulmonary effort is normal. No respiratory distress.      Breath sounds: Normal breath sounds. No wheezing.   Abdominal:      General: Bowel sounds are normal. There is no distension.      Palpations: Abdomen is soft.      Tenderness: There  is no abdominal tenderness.   Musculoskeletal:         General: No swelling.   Skin:     General: Skin is warm and dry.   Neurological:      General: No focal deficit present.      Mental Status: He is alert and oriented to person, place, and time.      I/O:  I/O last 24 hours:    Intake/Output Summary (Last 24 hours) at 03/17/2023 1441  Last data filed at 03/17/2023 0900  Gross per 24 hour   Intake 480 ml   Output --   Net 480 ml     I/O current shift:  04/17 0700 - 04/17 1859  In: 480 [P.O.:480]  Out: -   Labs:  Reviewed: I have reviewed all lab results.  Lab Results Today:    Results for orders placed or performed during the hospital encounter of 03/16/23 (from the past 24 hour(s))   TROPONIN-I   Result Value Ref Range    TROPONIN I 46 (H) <20 ng/L   TROPONIN-I   Result Value Ref Range    TROPONIN I 32 (H) <20 ng/L   TROPONIN-I   Result Value Ref Range    TROPONIN I 22 (H) <20 ng/L   TROPONIN-I   Result Value Ref Range    TROPONIN I 15 <20 ng/L     Micro Results: No results found for any visits on 03/16/23 (from the past 24 hour(s)).  Images:   XR AP MOBILE CHEST    Result Date: 03/16/2023  Impression NO ACUTE FINDINGS. Radiologist location ID: [id number]     XR AP MOBILE CHEST   Final Result   NO ACUTE FINDINGS.            Radiologist location ID: [id number]           Problem List:  Active Hospital Problems   (*Primary Problem)    Diagnosis    Elevated troponin    Murmur     Assessment/ Plan:   Elevated troponin  Troponins 08/18/40/46/32/22/15. CXR negative. EKG shows no changes. Low suspicion ACS  Telemetry monitoring. Echocardiogram ordered. Cardiology consulted and reports patient can be d/c if echo norma with outpatient follow up.   Continue home aspirin and statin therapy.   Atrial fibrillation  Chronic. Currently rate and rhythm controlled. Continue home medications with Xarelto and Cardizem.   Hypertensive urgency  BP 213/79  on presentation to ED. Continue home lisinopril. Hydralazine PRN.   Improved.    Murmur  Diastolic and systolic, echo ordered  Other  Continue home medications as appropriate.  Discharge home later today if echo normal. Nursing to call with results. Follow up with PCP and Dr. Renda Rolls in 1 week.     -further treatment adjustments will be made based off clinical course.  See orders for further details.  -Hospitalist personally evaluated and examined the patient in conjunction with the MLP and agree with the assessments, treatment plan and disposition of the patient as recorded by the [facility]     DVT/PE Prophylaxis: Rivaroxaban    Maxine Glenn, FNP-BC

## 2023-03-17 NOTE — Telemedicine Consult (Signed)
[facility]  Cardiology   Consult      TELEMEDICINE CARDIOLOGY CONSULT NOTE    Telemedicine Documentation:   Modality: Video  Provider Physical Location: [place], [facility]  Patient/family aware of provider location: Yes  Patient/family consent for telemedicine: Yes  Examination observed and performed by: April Christ, FNP-C, Dr. Warnell Bureau  Patient Location: [facility]    Name:  Ricky Grimes   DOB: January 26, 1951   MRN: [id number]   Date:  03/16/2023     PCP: Weyman Pedro, DO   Hospital Day:  LOS: 0 days   Chief Complaint: Chest Pain  (Denies pain since 830 am)      IMPRESSION/PLAN:  Type 2 MI  Hypertensive urgency   PAF    Obtain echocardiogram - if normal can follow up for OP MPS with Dr. Renda Rolls  Continue home medications      HPI:  Ricky Grimes is a 72 y.o. male who presented with chest paint hat began the night prior with worsening, associated diaphoresis. Pt also reporting non-productive cough "feels congested".  PMH Afib on Xarelto,  HTN, hld,   Labs Hgb 14.9, Plt 175, BC 4.5, D dimer negative, K+ 4.5, Bun 22, Cr 1.29, Troponin 9,19,41, 46, 32,22, 15,  Mag 1.8, CXR no acute findings.  BP 213/79, HR 80, 99% room air, afebrile.  EKG SR/SA 63 beats per minute.     Past Medical History:   Diagnosis Date    Anticoagulant long-term use     Anxiety     Atrial fibrillation (CMS HCC)     Hypercholesterolemia     Hypertension     Migraine     Rectal bleeding     S/P rotator cuff repair     Wears glasses          Patient Active Problem List    Diagnosis Date Noted    Elevated troponin 03/16/2023    Chest pain 03/16/2023    Hypertensive urgency 03/16/2023    Murmur 03/16/2023    Hematochezia 12/22/2022    Sinusitis 12/22/2022    Chronic kidney disease (CKD), active medical management without dialysis, stage 2 (mild) 12/22/2022    Hip pain, right 12/22/2022    Overweight 12/22/2022    Actinic keratosis 12/22/2022    Hyperlipidemia 09/25/2022    Hypertension 09/25/2022     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    Vitamin D deficiency 09/25/2022    Vitamin B 12 deficiency 09/25/2022    Urinary frequency 09/25/2022    Allergic rhinitis 09/25/2022    GERD with esophagitis 09/25/2022    A-fib (CMS HCC) 09/25/2022    Onychomycosis 09/25/2022    It band syndrome, right 09/25/2022    Prostate cancer screening 09/25/2022    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    Sleeping difficulties 09/25/2022    Migraines 09/25/2022    Fever blister 09/25/2022    Neck pain, chronic 09/25/2022    Chronic anticoagulation 07/29/2022    Pain in right hand 12/18/2021    Osteoarthritis of right hip 12/11/2021    Trochanteric bursitis of right hip 12/11/2021      Past Surgical History:   Procedure Laterality Date    KNEE ARTHROSCOPY Left           acetaminophen (TYLENOL) tablet, 650 mg, Oral, Q4H PRN  aspirin chewable tablet 81 mg, 81 mg, Oral, Daily  atorvastatin (LIPITOR) tablet, 10 mg, Oral, QPM  baclofen (LIORESAL) tablet, 10 mg, Oral, Daily PRN  clonazePAM (klonoPIN) tablet, 1 mg, Oral, HS PRN  promethazine (PHENERGAN) 6.25mg  per 5mL oral liquid, 6.25 mg, Oral, 4x/day PRN   And  dextromethorphan-guaiFENesin (ROBITUSSIN DM) 10-100mg  per 5mL oral liquid, 5 mL, Oral, 4x/day PRN  dilTIAZem (CARDIZEM CD) 24 hr extended release capsule, 240 mg, Oral, QAM  hydrALAZINE (APRESOLINE) injection 10 mg, 10 mg, Intravenous, Q6H PRN  ipratropium (ATROVENT) 0.06% nasal spray, 2 Spray, Nasal, 3x/day  linaclotide (LINZESS) capsule, 145 mcg, Oral, QAM  lisinopril (PRINIVIL) tablet, 20 mg, Oral, Daily  loratadine (CLARITIN) tablet, 10 mg, Oral, Daily PRN  montelukast (SINGULAIR) 10 mg tablet, 10 mg, Oral, Daily PRN  pantoprazole (PROTONIX) delayed release tablet, 40 mg, Oral, Daily  rivaroxaban (XARELTO) tablet, 20 mg, Oral, Daily with Dinner  vibegron Tablet 75 mg, 1 Tablet, Oral, Daily         Allergies   Allergen Reactions    Metoclopramide  Other Adverse Reaction (Add comment)     Shakes unable  to be still     Family Medical History:       Problem Relation (Age of Onset)    Atrial fibrillation Father    Cancer Father    No Known Problems Mother, Sister, Brother, Half-Sister, Half-Brother, Maternal Aunt, Maternal Uncle, Paternal Aunt, Paternal Uncle, Maternal Grandmother, Maternal Grandfather, Paternal Grandmother, Paternal Grandfather, Daughter, Son            Social History     Tobacco Use    Smoking status: Never    Smokeless tobacco: Never   Substance Use Topics    Alcohol use: Yes     Alcohol/week: 12.0 standard drinks of alcohol     Types: 12 Cans of beer per week     Comment: per week       ROS: All other ROS are negative except for what is noted in HPI     I/O last 24 hours:    Intake/Output Summary (Last 24 hours) at 03/17/2023 0916  Last data filed at 03/16/2023 1100  Gross per 24 hour   Intake 500 ml   Output --   Net 500 ml     I/O current shift:  No intake/output data recorded.    DIAGNOSTIC STUDIES:    Results for orders placed or performed during the hospital encounter of 03/16/23 (from the past 24 hour(s))   TROPONIN-I IN ONE HOUR   Result Value Ref Range    TROPONIN I 19 <20 ng/L    Narrative    Values received on females ranging between 12-15 ng/L MUST include the next serial troponin to review changes in the delta differences as the reference range for the Access II chemistry analyzer is lower than the established reference range.     TROPONIN-I IN THREE HOURS   Result Value Ref Range    TROPONIN I 41 (H) <20 ng/L    Narrative    Values received on females ranging between 12-15 ng/L MUST include the next serial troponin to review changes in the delta differences as the reference range for the Access II chemistry analyzer is lower than the established reference range.     URINALYSIS WITH REFLEX MICROSCOPIC AND CULTURE IF POSITIVE    Specimen: Urine, Site not specified    Narrative    The following orders were created for panel order URINALYSIS WITH REFLEX MICROSCOPIC AND CULTURE IF  POSITIVE.  Procedure  Abnormality         Status                     ---------                               -----------         ------                     URINALYSIS, MACRO/MICRO[[id number]]      Abnormal            Final result                 Please view results for these tests on the individual orders.   URINALYSIS, MACRO/MICRO   Result Value Ref Range    COLOR Light Yellow (A) Yellow    APPEARANCE Clear Clear    SPECIFIC GRAVITY 1.025 1.003 - 1.035    PH 6.0 4.6 - 8.0    LEUKOCYTES Negative Negative WBCs/uL    NITRITE Negative Negative    PROTEIN Negative Negative mg/dL    GLUCOSE Negative Negative mg/dL    KETONES Negative Negative mg/dL    BILIRUBIN Negative Negative mg/dL    BLOOD Negative Negative mg/dL    UROBILINOGEN 0.2 0.2 - 1.0 mg/dL   TROPONIN-I   Result Value Ref Range    TROPONIN I 46 (H) <20 ng/L    Narrative    Values received on females ranging between 12-15 ng/L MUST include the next serial troponin to review changes in the delta differences as the reference range for the Access II chemistry analyzer is lower than the established reference range.     TROPONIN-I   Result Value Ref Range    TROPONIN I 32 (H) <20 ng/L   TROPONIN-I   Result Value Ref Range    TROPONIN I 22 (H) <20 ng/L   TROPONIN-I   Result Value Ref Range    TROPONIN I 15 <20 ng/L        Cardiopulmonary/Radiology:  XR AP MOBILE CHEST    Result Date: 03/16/2023  Impression NO ACUTE FINDINGS. Radiologist location ID: [id number]     FLUORO INJECTION HIP RIGHT    Result Date: 02/15/2023  Impression Fluoroscopically guided therapeutic injection of the right hip joint, as described above. Radiologist location ID: [id number]      No results found for this or any previous visit (from the past 2400 hour(s)).     PHYSICAL EXAMINATION:  BP (!) (P) 102/54   Pulse (P) 54   Temp 37 C (98.6 F)   Resp (P) 18   Ht 1.727 m (5' 7.99")   Wt 77.7 kg (171 lb 6 oz)   SpO2 (P) 95%   BMI 26.06 kg/m     General: No  acute distress and appears stated age.    Neck: No JVD, no carotid bruit.    Lungs: Clear to auscultation bilaterally.    Cardiovascular: Regular rate and rhythm, normal S1-S2, systolic murmur 2/,  no gallop or rub.    Abdomen: Soft, bowel sounds normal.    Extremities: No edema.  Skin: Skin warm and dry.    Neurologic: Alert and oriented x3.    I participated and evaluated the patient as part of a collaborative telemedicine service.  See Dr. Warnell Bureau 's addendum for additional information.  My findings from this visit are as stated above.  The  co-signing physician did not participate in the management of this patient unless otherwise noted.      April Christ, FNP-C 03/17/2023 09:16    Late entry for 03/17/23. I personally saw and evaluated the patient. See mid-level's note for additional details. My findings/participation are as above    Brayton Layman, DO

## 2023-03-18 ENCOUNTER — Other Ambulatory Visit: Payer: Self-pay

## 2023-03-18 ENCOUNTER — Observation Stay (HOSPITAL_BASED_OUTPATIENT_CLINIC_OR_DEPARTMENT_OTHER): Payer: Commercial Managed Care - PPO

## 2023-03-18 DIAGNOSIS — R079 Chest pain, unspecified: Secondary | ICD-10-CM

## 2023-03-18 DIAGNOSIS — R011 Cardiac murmur, unspecified: Secondary | ICD-10-CM

## 2023-03-18 LAB — CBC
HCT: 37.8 % (ref 36.7–47.1)
HGB: 12.7 g/dL (ref 12.5–16.3)
MCH: 31.9 pg (ref 23.8–33.4)
MCHC: 33.6 g/dL (ref 32.5–36.3)
MCV: 95.1 fL (ref 73.0–96.2)
MPV: 8 fL (ref 7.4–11.4)
PLATELETS: 145 10*3/uL (ref 140–440)
RBC: 3.97 10*6/uL — ABNORMAL LOW (ref 4.06–5.63)
RDW: 13.9 % (ref 12.1–16.2)
WBC: 3.4 10*3/uL — ABNORMAL LOW (ref 3.6–10.2)

## 2023-03-18 LAB — BASIC METABOLIC PANEL
ANION GAP: 6 mmol/L (ref 4–13)
BUN/CREA RATIO: 21 (ref 6–22)
BUN: 22 mg/dL (ref 7–25)
CALCIUM: 8.4 mg/dL — ABNORMAL LOW (ref 8.6–10.3)
CHLORIDE: 111 mmol/L — ABNORMAL HIGH (ref 98–107)
CO2 TOTAL: 24 mmol/L (ref 21–31)
CREATININE: 1.07 mg/dL (ref 0.60–1.30)
ESTIMATED GFR: 73 mL/min/{1.73_m2} (ref >59)
GLUCOSE: 98 mg/dL (ref 74–109)
OSMOLALITY, CALCULATED: 285 mOsm/kg (ref 270–290)
POTASSIUM: 4.1 mmol/L (ref 3.5–5.1)
SODIUM: 141 mmol/L (ref 136–145)

## 2023-03-18 MED ORDER — LISINOPRIL 20 MG TABLET
20.0000 mg | ORAL_TABLET | Freq: Every day | ORAL | Status: DC
Start: 2023-03-18 — End: 2024-01-28

## 2023-03-18 MED ORDER — DILTIAZEM CD 240 MG CAPSULE,EXTENDED RELEASE 24 HR
240.0000 mg | ORAL_CAPSULE | Freq: Every morning | ORAL | Status: DC
Start: 2023-03-18 — End: 2024-07-05

## 2023-03-18 NOTE — Telemedicine Consult (Signed)
[facility]  Cardiology   Consult      TELEMEDICINE CARDIOLOGY CONSULT NOTE    Telemedicine Documentation:   Modality: Video  Provider Physical Location: [place], [facility]  Patient/family aware of provider location: Yes  Patient/family consent for telemedicine: Yes  Examination observed and performed by: Hassel Neth, PA-C, Dr. Warnell Bureau  Patient Location: [facility]    Name:  Ricky Grimes   DOB: 26-Oct-1951   MRN: [id number]   Date:  03/16/2023     PCP: Weyman Pedro, DO   Hospital Day:  LOS: 0 days   Chief Complaint: Chest Pain  (Denies pain since 830 am)      IMPRESSION/PLAN:  Type 2 MI  Hypertensive urgency   PAF    Patient can be discharged home to follow up with Dr. Renda Rolls for outpatient stress.     HPI:  Ricky Grimes is a 72 y.o. male who presented with chest paint hat began the night prior with worsening, associated diaphoresis. Pt also reporting non-productive cough "feels congested".  PMH Afib on Xarelto,  HTN, hld,   Labs Hgb 14.9, Plt 175, BC 4.5, D dimer negative, K+ 4.5, Bun 22, Cr 1.29, Troponin 9,19,41, 46, 32,22, 15,  Mag 1.8, CXR no acute findings.  BP 213/79, HR 80, 99% room air, afebrile.  EKG SR/SA 63 beats per minute.    03/18/23 Progress update: Echocardiogram shows EF of 71% however there is moderate to severe aortic insufficiency. Patient has been asymptomatic with during this hospital admission. Denies any current chest pain, shortness of breath or palpitations.     Past Medical History:   Diagnosis Date    Anticoagulant long-term use     Anxiety     Atrial fibrillation (CMS HCC)     Hypercholesterolemia     Hypertension     Migraine     Rectal bleeding     S/P rotator cuff repair     Wears glasses          Patient Active Problem List    Diagnosis Date Noted    Elevated troponin 03/16/2023    Murmur 03/16/2023    Hematochezia 12/22/2022    Sinusitis 12/22/2022    Chronic kidney disease (CKD), active medical management without dialysis, stage 2  (mild) 12/22/2022    Hip pain, right 12/22/2022    Overweight 12/22/2022    Actinic keratosis 12/22/2022    Hyperlipidemia 09/25/2022    Hypertension 09/25/2022    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    Vitamin D deficiency 09/25/2022    Vitamin B 12 deficiency 09/25/2022    Urinary frequency 09/25/2022    Allergic rhinitis 09/25/2022    GERD with esophagitis 09/25/2022    A-fib (CMS HCC) 09/25/2022    Onychomycosis 09/25/2022    It band syndrome, right 09/25/2022    Prostate cancer screening 09/25/2022    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    Sleeping difficulties 09/25/2022    Migraines 09/25/2022    Fever blister 09/25/2022    Neck pain, chronic 09/25/2022    Chronic anticoagulation 07/29/2022    Pain in right hand 12/18/2021    Osteoarthritis of right hip 12/11/2021    Trochanteric bursitis of right hip 12/11/2021      Past Surgical History:   Procedure Laterality Date    KNEE ARTHROSCOPY Left           acetaminophen (TYLENOL) tablet, 650 mg, Oral, Q4H PRN  aspirin chewable tablet 81 mg,  81 mg, Oral, Daily  atorvastatin (LIPITOR) tablet, 10 mg, Oral, QPM  baclofen (LIORESAL) tablet, 10 mg, Oral, Daily PRN  clonazePAM (klonoPIN) tablet, 1 mg, Oral, HS PRN  promethazine (PHENERGAN) 6.25mg  per 5mL oral liquid, 6.25 mg, Oral, 4x/day PRN   And  dextromethorphan-guaiFENesin (ROBITUSSIN DM) 10-100mg  per 5mL oral liquid, 5 mL, Oral, 4x/day PRN  [Held by provider] dilTIAZem (CARDIZEM CD) 24 hr extended release capsule, 240 mg, Oral, QAM  hydrALAZINE (APRESOLINE) injection 10 mg, 10 mg, Intravenous, Q6H PRN  ipratropium (ATROVENT) 0.06% nasal spray, 2 Spray, Nasal, 3x/day  linaclotide (LINZESS) capsule, 145 mcg, Oral, QAM  lisinopril (PRINIVIL) tablet, 20 mg, Oral, Daily  loratadine (CLARITIN) tablet, 10 mg, Oral, Daily PRN  montelukast (SINGULAIR) 10 mg tablet, 10 mg, Oral, Daily PRN  pantoprazole (PROTONIX) delayed release tablet, 40 mg, Oral, Daily  rivaroxaban (XARELTO) tablet, 20 mg,  Oral, Daily with Dinner  vibegron Tablet 75 mg, 1 Tablet, Oral, Daily       Current Outpatient Rx   Medication Sig Dispense Refill    dilTIAZem (CARDIZEM CD) 240 mg Oral Capsule, Sust. Release 24 hr Take 1 Capsule (240 mg total) by mouth Every morning Do not take if sbp<115 or heart rate<60      lisinopriL (PRINIVIL) 20 mg Oral Tablet Take 1 Tablet (20 mg total) by mouth Once a day Do not take if sbp<115       Allergies   Allergen Reactions    Metoclopramide  Other Adverse Reaction (Add comment)     Shakes unable to be still     Family Medical History:       Problem Relation (Age of Onset)    Atrial fibrillation Father    Cancer Father    No Known Problems Mother, Sister, Brother, Half-Sister, Half-Brother, Maternal Aunt, Maternal Uncle, Paternal Aunt, Paternal Uncle, Maternal Grandmother, Maternal Grandfather, Paternal Grandmother, Paternal Grandfather, Daughter, Son            Social History     Tobacco Use    Smoking status: Never    Smokeless tobacco: Never   Substance Use Topics    Alcohol use: Yes     Alcohol/week: 12.0 standard drinks of alcohol     Types: 12 Cans of beer per week     Comment: per week       ROS: All other ROS are negative except for what is noted in HPI     I/O last 24 hours:    Intake/Output Summary (Last 24 hours) at 03/18/2023 1737  Last data filed at 03/18/2023 1400  Gross per 24 hour   Intake 900 ml   Output --   Net 900 ml     I/O current shift:  04/18 0700 - 04/18 1859  In: 240 [P.O.:240]  Out: -     DIAGNOSTIC STUDIES:    Results for orders placed or performed during the hospital encounter of 03/16/23 (from the past 24 hour(s))   CBC   Result Value Ref Range    WBC 3.4 (L) 3.6 - 10.2 x10^3/uL    RBC 3.97 (L) 4.06 - 5.63 x10^6/uL    HGB 12.7 12.5 - 16.3 g/dL    HCT 29.5 62.1 - 30.8 %    MCV 95.1 73.0 - 96.2 fL    MCH 31.9 23.8 - 33.4 pg    MCHC 33.6 32.5 - 36.3 g/dL    RDW 65.7 84.6 - 96.2 %    PLATELETS 145 140 - 440 x10^3/uL  MPV 8.0 7.4 - 11.4 fL    Narrative    [id number] ON  [id number] IN 0001/2   BASIC METABOLIC PANEL   Result Value Ref Range    SODIUM 141 136 - 145 mmol/L    POTASSIUM 4.1 3.5 - 5.1 mmol/L    CHLORIDE 111 (H) 98 - 107 mmol/L    CO2 TOTAL 24 21 - 31 mmol/L    ANION GAP 6 4 - 13 mmol/L    CALCIUM 8.4 (L) 8.6 - 10.3 mg/dL    GLUCOSE 98 74 - 098 mg/dL    BUN 22 7 - 25 mg/dL    CREATININE 1.19 1.47 - 1.30 mg/dL    BUN/CREA RATIO 21 6 - 22    ESTIMATED GFR 73 >59 mL/min/1.74m^2    OSMOLALITY, CALCULATED 285 270 - 290 mOsm/kg    Narrative    Estimated Glomerular Filtration Rate (eGFR) is calculated using the CKD-EPI (2021) equation, intended for patients 21 years of age and older. If gender is not documented or "unknown", there will be no eGFR calculation.          Cardiopulmonary/Radiology:  XR AP MOBILE CHEST    Result Date: 03/16/2023  Impression NO ACUTE FINDINGS. Radiologist location ID: [id number]      No results found for this or any previous visit (from the past 2400 hour(s)).     PHYSICAL EXAMINATION:  BP (!) 144/66   Pulse 60   Temp 36.8 C (98.2 F)   Resp 18   Ht 1.727 m (5' 7.99")   Wt 77.7 kg (171 lb 6 oz)   SpO2 98%   BMI 26.06 kg/m     General: No acute distress and appears stated age.    HEENT: Head normocephalic, atraumatic.  Mucouse membranes moist.    Neck: No JVD, no carotid bruit.    Lungs: Clear to auscultation bilaterally.    Cardiovascular: Regular rate and rhythm, normal S1-S2 without murmur, no gallop or rub.    Abdomen: Soft, bowel sounds normal.    Extremities: No edema.  Skin: Skin warm and dry.    Neurologic: Alert and oriented x3.  Psych: Mood and affect congruent for age and gender     I participated and evaluated the patient as part of a collaborative telemedicine service.  See Dr. Warnell Bureau 's addendum for additional information.  My findings from this visit are as stated above.  The co-signing physician did not participate in the management of this patient unless otherwise noted.      Hassel Neth, PA-C 03/18/2023 17:37    Late entry  for 03/18/23. I personally saw and evaluated the patient. See mid-level's note for additional details. My findings/participation are as above    Brayton Layman, DO

## 2023-03-18 NOTE — Care Plan (Signed)
Problem: Adult Inpatient Plan of Care  Goal: Plan of Care Review  Outcome: Ongoing (see interventions/notes)  Goal: Patient-Specific Goal (Individualized)  Outcome: Ongoing (see interventions/notes)  Goal: Absence of Hospital-Acquired Illness or Injury  Outcome: Ongoing (see interventions/notes)  Intervention: Prevent and Manage VTE (Venous Thromboembolism) Risk  Recent Flowsheet Documentation  Taken 03/17/2023 2100 by Roxy Horseman, RN  VTE Prevention/Management: ambulation promoted  Goal: Optimal Comfort and Wellbeing  Outcome: Ongoing (see interventions/notes)  Goal: Rounds/Family Conference  Outcome: Ongoing (see interventions/notes)

## 2023-03-18 NOTE — Nurses Notes (Signed)
Pt dc'd home with family. DC instructions given and explained to pt and family both verbalized understanding. Pt transported to entrance via w/c

## 2023-03-18 NOTE — Progress Notes (Addendum)
[facility]               IP PROGRESS NOTE      Ricky Grimes, Ricky Grimes  Date of Admission:  03/16/2023  Date of Birth:  May 18, 1951  Date of Service:  03/18/2023    Hospital Day:  LOS: 0 days     Subjective:   Patient sitting up on side of bed. Just had echo done. Denies any complaints, chest pain. States he has felt fine since he hit the ER door at [facility] on Monday.     Vital Signs:  Temp (24hrs) Max:37.1 C (98.7 F)      Temperature: 37.1 C (98.7 F)  BP (Non-Invasive): 131/69  MAP (Non-Invasive): 87 mmHG  Heart Rate: 61  Respiratory Rate: 17  SpO2: 96 %    Current Medications:  acetaminophen (TYLENOL) tablet, 650 mg, Oral, Q4H PRN  aspirin chewable tablet 81 mg, 81 mg, Oral, Daily  atorvastatin (LIPITOR) tablet, 10 mg, Oral, QPM  baclofen (LIORESAL) tablet, 10 mg, Oral, Daily PRN  clonazePAM (klonoPIN) tablet, 1 mg, Oral, HS PRN  promethazine (PHENERGAN) 6.25mg  per 5mL oral liquid, 6.25 mg, Oral, 4x/day PRN   And  dextromethorphan-guaiFENesin (ROBITUSSIN DM) 10-100mg  per 5mL oral liquid, 5 mL, Oral, 4x/day PRN  [Held by provider] dilTIAZem (CARDIZEM CD) 24 hr extended release capsule, 240 mg, Oral, QAM  hydrALAZINE (APRESOLINE) injection 10 mg, 10 mg, Intravenous, Q6H PRN  ipratropium (ATROVENT) 0.06% nasal spray, 2 Spray, Nasal, 3x/day  linaclotide (LINZESS) capsule, 145 mcg, Oral, QAM  lisinopril (PRINIVIL) tablet, 20 mg, Oral, Daily  loratadine (CLARITIN) tablet, 10 mg, Oral, Daily PRN  montelukast (SINGULAIR) 10 mg tablet, 10 mg, Oral, Daily PRN  pantoprazole (PROTONIX) delayed release tablet, 40 mg, Oral, Daily  rivaroxaban (XARELTO) tablet, 20 mg, Oral, Daily with Dinner  vibegron Tablet 75 mg, 1 Tablet, Oral, Daily    Current Orders:  Active Orders   Diet    DIET REGULAR Do you want to initiate MNT Protocol? Yes     Frequency: All Meals     Number of Occurrences: 1 Occurrences   Nursing    ACTIVITY Activity: AS TOLERATED; Instructions: WITH ASSIST     Frequency: UNTIL DISCONTINUED     Number  of Occurrences: Until Specified    CONTINUOUS CARDIAC MONITORING (ED USE ONLY)     Frequency: ONE TIME     Number of Occurrences: 1 Occurrences    Notify MD Vital Signs     Frequency: PRN     Number of Occurrences: Until Specified    PT IS HIGH RISK FOR VENOUS THROMBOEMBOLISM     Frequency: CONTINUOUS     Number of Occurrences: Until Specified    PULSE OXIMETRY CONTINUOUS     Frequency: CONTINUOUS     Number of Occurrences: Until Specified    PULSE OXIMETRY Q4H     Frequency: Q4H     Number of Occurrences: Until Specified    TELEMETRY MONITORING X 48H     Frequency: CONTINUOUS X 48 HRS     Number of Occurrences: 48 Hours    VITAL SIGNS  Q4H     Frequency: Q4H     Number of Occurrences: Until Specified   Code Status    FULL CODE     Frequency: CONTINUOUS     Number of Occurrences: Until Specified   Consult    IP CONSULT TO [facility] - TELEMEDICINE     Frequency: ONE TIME     Number of  Occurrences: 1 Occurrences   ECHO    TRANSTHORACIC ECHOCARDIOGRAM - ADULT     Frequency: ONE TIME     Number of Occurrences: 1 Occurrences     Scheduling Instructions:               IV    INSERT & MAINTAIN PERIPHERAL IV ACCESS     Frequency: UNTIL DISCONTINUED     Number of Occurrences: Until Specified   Discharge    DISCHARGE PATIENT     Frequency: ONE TIME     Number of Occurrences: 1 Occurrences     Order Comments: Discharge home if echocardiogram normal per cardiology. Call hospitalist with results.   Outpatient MPS to be scheduled with results to Dr. Renda Rolls.   Follow up with PCP in 1week.   Follow up with dr. Renda Rolls in 1 week.     Medications    acetaminophen (TYLENOL) tablet     Frequency: Q4H PRN     Dose: 650 mg     Route: Oral    aspirin chewable tablet 81 mg     Frequency: Daily     Dose: 81 mg     Route: Oral    atorvastatin (LIPITOR) tablet     Frequency: QPM     Dose: 10 mg     Route: Oral    baclofen (LIORESAL) tablet     Frequency: Daily PRN     Dose: 10 mg     Route: Oral    clonazePAM (klonoPIN) tablet      Frequency: HS PRN     Dose: 1 mg     Route: Oral    dextromethorphan-guaiFENesin (ROBITUSSIN DM) 10-100mg  per 5mL oral liquid     Linked Order: And     Frequency: 4x/day PRN     Dose: 5 mL     Route: Oral    dilTIAZem (CARDIZEM CD) 24 hr extended release capsule     Frequency: QAM     Dose: 240 mg     Route: Oral    hydrALAZINE (APRESOLINE) injection 10 mg     Frequency: Q6H PRN     Dose: 10 mg     Route: Intravenous    ipratropium (ATROVENT) 0.06% nasal spray     Frequency: 3x/day     Dose: 2 Spray     Route: Nasal    linaclotide (LINZESS) capsule     Frequency: QAM     Dose: 145 mcg     Route: Oral    lisinopril (PRINIVIL) tablet     Frequency: Daily     Dose: 20 mg     Route: Oral    loratadine (CLARITIN) tablet     Frequency: Daily PRN     Dose: 10 mg     Route: Oral    montelukast (SINGULAIR) 10 mg tablet     Frequency: Daily PRN     Dose: 10 mg     Route: Oral    pantoprazole (PROTONIX) delayed release tablet     Frequency: Daily     Dose: 40 mg     Route: Oral    promethazine (PHENERGAN) 6.25mg  per 5mL oral liquid     Linked Order: And     Frequency: 4x/day PRN     Dose: 6.25 mg     Route: Oral    rivaroxaban (XARELTO) tablet     Frequency: Daily with Dinner     Dose: 20 mg     Route: Oral  vibegron Tablet 75 mg     Frequency: Daily     Dose: 1 Tablet     Route: Oral      Review of Systems:  Focused review of system was completed. Refer to the HPI for ROS details.     Today's Physical Exam:  Physical Exam  Constitutional:       General: He is not in acute distress.  Cardiovascular:      Rate and Rhythm: Normal rate and regular rhythm.      Heart sounds: Murmur heard.   Pulmonary:      Effort: Pulmonary effort is normal. No respiratory distress.      Breath sounds: Normal breath sounds. No wheezing.   Abdominal:      General: Bowel sounds are normal. There is no distension.      Palpations: Abdomen is soft.      Tenderness: There is no abdominal tenderness.   Musculoskeletal:         General: No swelling.    Skin:     General: Skin is warm and dry.   Neurological:      General: No focal deficit present.      Mental Status: He is alert and oriented to person, place, and time.      I/O:  I/O last 24 hours:    Intake/Output Summary (Last 24 hours) at 03/18/2023 1056  Last data filed at 03/17/2023 2100  Gross per 24 hour   Intake 900 ml   Output --   Net 900 ml     I/O current shift:  No intake/output data recorded.  Labs:  Reviewed: I have reviewed all lab results.  Lab Results Today:    Results for orders placed or performed during the hospital encounter of 03/16/23 (from the past 24 hour(s))   CBC   Result Value Ref Range    WBC 3.4 (L) 3.6 - 10.2 x10^3/uL    RBC 3.97 (L) 4.06 - 5.63 x10^6/uL    HGB 12.7 12.5 - 16.3 g/dL    HCT 13.0 86.5 - 78.4 %    MCV 95.1 73.0 - 96.2 fL    MCH 31.9 23.8 - 33.4 pg    MCHC 33.6 32.5 - 36.3 g/dL    RDW 69.6 29.5 - 28.4 %    PLATELETS 145 140 - 440 x10^3/uL    MPV 8.0 7.4 - 11.4 fL   BASIC METABOLIC PANEL   Result Value Ref Range    SODIUM 141 136 - 145 mmol/L    POTASSIUM 4.1 3.5 - 5.1 mmol/L    CHLORIDE 111 (H) 98 - 107 mmol/L    CO2 TOTAL 24 21 - 31 mmol/L    ANION GAP 6 4 - 13 mmol/L    CALCIUM 8.4 (L) 8.6 - 10.3 mg/dL    GLUCOSE 98 74 - 132 mg/dL    BUN 22 7 - 25 mg/dL    CREATININE 4.40 1.02 - 1.30 mg/dL    BUN/CREA RATIO 21 6 - 22    ESTIMATED GFR 73 >59 mL/min/1.75m^2    OSMOLALITY, CALCULATED 285 270 - 290 mOsm/kg     Micro Results: No results found for any visits on 03/16/23 (from the past 24 hour(s)).  Images:   No results found.   XR AP MOBILE CHEST   Final Result   NO ACUTE FINDINGS.            Radiologist location ID: [id number]  Problem List:  Active Hospital Problems   (*Primary Problem)    Diagnosis    Elevated troponin    Murmur     Assessment/ Plan:   Elevated troponin  Troponins 08/18/40/46/32/22/15. CXR negative. EKG shows no changes. Low suspicion ACS  Telemetry monitoring. Echocardiogram ordered. Cardiology consulted and reports patient can be d/c if echo  normal with outpatient follow up and MPS.  Continue home aspirin and statin therapy.   Atrial fibrillation  Chronic. Currently rate and rhythm controlled. Continue home medications with Xarelto and Cardizem.   Hypertensive urgency  BP 213/79 on presentation to ED. Continue home lisinopril. Hydralazine PRN.   Improved.   Murmur  Diastolic and systolic, echo ordered  Other  Continue home medications as appropriate.  Discharge home later today if echo normal. Nursing to call with results. Follow up with PCP and Dr. Renda Rolls in 1 week.     -further treatment adjustments will be made based off clinical course.  See orders for further details.  -Hospitalist personally evaluated and examined the patient in conjunction with the MLP and agree with the assessments, treatment plan and disposition of the patient as recorded by the [facility].     DVT/PE Prophylaxis: Rivaroxaban    Maxine Glenn, FNP-BC    Addendum:  Echo showed LVEF 70.9%.   Global longitudinal strain calculated at -18.4.  LV diastolic dysfunction indeterminate.   At least moderate A1 if not severe, eccentric jet, on short axis views color leaks over to pulmonary artery area. If concern for perforation exists consider alternative imaging modality such as TEE or cardiac MRI.     Patient to be discharged home per cardiology today. Outpatient MPS to be arranged with outpatient follow up within 1 week with Dr. Renda Rolls. Follow up with PCP in 1 week. Dr. Julieta Bellini aware and in agreement with discharge plan at this time.

## 2023-03-19 ENCOUNTER — Telehealth (RURAL_HEALTH_CENTER): Payer: Self-pay | Admitting: Family Medicine

## 2023-03-19 NOTE — Telephone Encounter (Signed)
-----   Message from Beth Beavers sent at 03/19/2023  8:25 AM EDT -----  Regarding: TOC Call  Please do TOC this morning, discharge yesterday. Thanks

## 2023-03-19 NOTE — Telephone Encounter (Signed)
Tried  to call patinet again today no answer. I did speak with significant other on HIPAA he was at dr Renda Rolls office earlier for follow up after t he hospital.He unsure of which medicine changes since she not a t home however they have list at home and understand to hold if blood pressure low . He has follow up appt 5/2/ at 9 am dr Renda Rolls going to do stress  test and take it from there. Caregiver will let us know if anything further needed/br

## 2023-03-19 NOTE — Telephone Encounter (Signed)
Called patient today 9:12 went start to voicemail will try later/br

## 2023-03-22 ENCOUNTER — Ambulatory Visit (INDEPENDENT_AMBULATORY_CARE_PROVIDER_SITE_OTHER): Payer: Commercial Managed Care - PPO

## 2023-03-22 ENCOUNTER — Other Ambulatory Visit: Payer: Self-pay

## 2023-03-22 DIAGNOSIS — J309 Allergic rhinitis, unspecified: Secondary | ICD-10-CM

## 2023-03-22 NOTE — Telephone Encounter (Signed)
-----   Message from [facility] sent at 03/19/2023  8:25 AM EDT -----  Regarding: TOC Call  Please do TOC this morning, discharge yesterday. Thanks

## 2023-03-22 NOTE — Nursing Note (Signed)
03/22/23 1500   Vial A   Set 0   Injection Laterality Right   Injection Site Upper Arm   Dose  0.15   Time Observed 20 Minutes   Injection Site Reaction Negative   Vial B   Set 0   Injection Laterality Left   Injection Site Upper Arm   Dose  0.15   Time Observed 20 Minutes   Injection Site Reaction Negative     [place] Behlke Carolynn Comment, LPN

## 2023-03-22 NOTE — Telephone Encounter (Signed)
Transition of care completed today he was at cardiology Friday/br

## 2023-03-29 ENCOUNTER — Encounter (RURAL_HEALTH_CENTER): Payer: Self-pay | Admitting: Family Medicine

## 2023-03-29 ENCOUNTER — Ambulatory Visit (INDEPENDENT_AMBULATORY_CARE_PROVIDER_SITE_OTHER): Payer: Self-pay

## 2023-03-29 ENCOUNTER — Other Ambulatory Visit: Payer: Self-pay

## 2023-03-29 ENCOUNTER — Ambulatory Visit (RURAL_HEALTH_CENTER): Payer: Commercial Managed Care - PPO | Attending: Family Medicine | Admitting: Family Medicine

## 2023-03-29 ENCOUNTER — Ambulatory Visit (INDEPENDENT_AMBULATORY_CARE_PROVIDER_SITE_OTHER): Payer: Commercial Managed Care - PPO

## 2023-03-29 VITALS — BP 120/70 | HR 72 | Temp 99.2°F | Ht 68.0 in | Wt 173.0 lb

## 2023-03-29 DIAGNOSIS — I351 Nonrheumatic aortic (valve) insufficiency: Secondary | ICD-10-CM | POA: Insufficient documentation

## 2023-03-29 DIAGNOSIS — Z09 Encounter for follow-up examination after completed treatment for conditions other than malignant neoplasm: Secondary | ICD-10-CM | POA: Insufficient documentation

## 2023-03-29 DIAGNOSIS — I10 Essential (primary) hypertension: Secondary | ICD-10-CM | POA: Insufficient documentation

## 2023-03-29 DIAGNOSIS — I4891 Unspecified atrial fibrillation: Secondary | ICD-10-CM

## 2023-03-29 DIAGNOSIS — J309 Allergic rhinitis, unspecified: Secondary | ICD-10-CM

## 2023-03-29 MED ORDER — CLONAZEPAM 1 MG TABLET
1.0000 mg | ORAL_TABLET | Freq: Two times a day (BID) | ORAL | 3 refills | Status: DC
Start: 2023-03-29 — End: 2024-04-04

## 2023-03-29 MED ORDER — CLONIDINE HCL 0.1 MG TABLET
0.1000 mg | ORAL_TABLET | Freq: Two times a day (BID) | ORAL | 3 refills | Status: DC
Start: 2023-03-29 — End: 2024-04-04

## 2023-03-29 NOTE — Nursing Note (Signed)
03/29/23 1300   Vial A   Set 0   Injection Laterality Left   Injection Site Upper Arm   Dose  0.2   Time Observed 20 Minutes   Injection Site Reaction Negative   Vial B   Set 0   Injection Laterality Right   Injection Site Upper Arm   Dose  0.2   Time Observed 20 Minutes   Injection Site Reaction Negative     Amir Glaus Carolynn Comment, LPN

## 2023-03-29 NOTE — Progress Notes (Signed)
FAMILY MEDICINE, [facility]  [address]  [place] [address]  Operated by [facility]  Transitional Care Management Note    Name: QUE MENEELY MRN:  [id number]   Date: 03/29/2023 Age: 72 y.o.     Chief Complaint: Follow Up (Follow up/) and Hospital Discharge Transition       SUBJECTIVE:  Ricky Grimes is a 72 y.o. male presenting today for follow-up after being discharged. The main problem requiring admission was cp, elevated troponin,and  hypertensive urgency. He was admitted on 4/16 and discharged after r/o for acute MI on 4/18. He had a stress test on 4/30    May 16 having TEE  Dr. Renda Rolls sent him to [facility]    Cough medicine OTC. He has a lot of drainage. He took two shots of that. Two nights later he was in the hospital    Cardiac testing was as follows:  The left ventricular ejection fraction was calculated using the biplane Simpson`s rule method.  Left ventricular systolic function is normal.  The global longitudinal strain was calculated at -18.4.  Left ventricular diastolic dysfunction is indeterminate.  At least moderate AI if not severe, eccentric jet, on short axis views color leaks over to pulmonary artery area. If concern for perforation exists consider alternative imaging modality such as TEE or  cardiac MRI     Ejection Fraction is 70.9 %.     Findings  Left Ventricle:   Mildly dilated left ventricle. Normal geometry. Ejection Fraction is 70.9 %. Left ventricular systolic function is normal. The left ventricular ejection fraction was calculated using  the biplane Simpson`s rule method. No segmental/regional wall motion abnormalities identified. Left ventricular diastolic dysfunction is indeterminate. The global longitudinal strain was calculated at  -18.4.  Right Ventricle:   Normal right ventricular size. Normal right ventricular systolic function. RV systolic pressure is at the upper limits of normal.  Left Atrium:   The left atrium is  normal in size.  Right Atrium:   The right atrium is of normal size.  Mitral Valve:   No significant mitral regurgitation present.  Tricuspid Valve:   There is mild tricuspid regurgitation.  Aortic Valve:   Trileaflet aortic valve. No Aortic valve stenosis. The aortic regurgitation jet is eccentric and anteriorly directed. At least moderate AI if not severe, eccentric jet, on short axis  views color leaks over to pulmonary artery area. If concern for perforation exists consider alternative imaging modality such as TEE or cardiac MRI.  Pulmonic Valve:   There is mild pulmonic regurgitation.  IVC/Hepatic Veins:   The inferior vena cava was not visualized.  Aorta:   The aortic root is of normal size. The ascending aorta is normal in size.  Pericardium/Pleural space:   No significant pericardial effusion demonstrated.    OBJECTIVE:   BP 120/70 (Site: Left, Patient Position: Sitting, Cuff Size: Adult)   Pulse 72   Temp 37.3 C (99.2 F) (Tympanic)   Ht 1.727 m (5\' 8" )   Wt 78.5 kg (173 lb)   SpO2 96%   BMI 26.30 kg/m          Transition of Care Contact Information  Discharge date: Discharge Date: Not Found  Transition Facility Type  Facility Name Interactive Contact(s):  Completed Contact: 03/22/2023 10:30 AM  First Attempt Call: 03/19/2023  9:12 AM  Second Attempted Contact: 03/19/2023 12:19 PM  Contact Method(s)-- Patient/Caregiver Telephone  Clinical Staff Name/Role who contacted  Data Reviewed  Medication Reconciliation completed    Assessment and Plan    ICD-10-CM    1. Hospital discharge follow-up  Z09       2. Aortic insufficiency  I35.1 Referral to External Provider      3. Hypertension, unspecified type  I10       4. Atrial fibrillation (CMS HCC)  I48.91         Other transition actions (Optional) -: Discharge documentation was reviewed    Continue Xarelto and cardizem.  Aortic insufficiency needs further evaluation; Keep appt for TEE  Will need referral to surgeon. He would like to have valve  fixed.  Continue to follow locally with Dr. Renda Rolls    If sx return, to ER  Weyman Pedro, DO

## 2023-03-30 ENCOUNTER — Ambulatory Visit (HOSPITAL_COMMUNITY)
Admission: RE | Admit: 2023-03-30 | Discharge: 2023-03-30 | Disposition: A | Discharge status: Home / Self Care | Payer: Commercial Managed Care - PPO | Source: Ambulatory Visit | Attending: INTERNAL MEDICINE | Admitting: INTERNAL MEDICINE

## 2023-03-30 ENCOUNTER — Inpatient Hospital Stay (HOSPITAL_COMMUNITY)
Admission: RE | Admit: 2023-03-30 | Discharge: 2023-03-30 | Disposition: A | Discharge status: Home / Self Care | Payer: Commercial Managed Care - PPO | Source: Ambulatory Visit

## 2023-03-30 ENCOUNTER — Inpatient Hospital Stay (HOSPITAL_COMMUNITY): Admission: RE | Admit: 2023-03-30 | Payer: Commercial Managed Care - PPO | Source: Ambulatory Visit

## 2023-03-30 ENCOUNTER — Inpatient Hospital Stay (HOSPITAL_COMMUNITY)
Admission: RE | Admit: 2023-03-30 | Discharge: 2023-03-30 | Disposition: A | Discharge status: Home / Self Care | Payer: Commercial Managed Care - PPO | Source: Ambulatory Visit | Attending: INTERNAL MEDICINE | Admitting: INTERNAL MEDICINE

## 2023-03-30 ENCOUNTER — Ambulatory Visit
Admission: RE | Admit: 2023-03-30 | Discharge: 2023-03-30 | Disposition: A | Discharge status: Home / Self Care | Payer: Commercial Managed Care - PPO | Source: Ambulatory Visit | Attending: INTERNAL MEDICINE | Admitting: INTERNAL MEDICINE

## 2023-03-30 DIAGNOSIS — R079 Chest pain, unspecified: Secondary | ICD-10-CM | POA: Insufficient documentation

## 2023-03-30 MED ORDER — REGADENOSON 0.4 MG/5 ML INTRAVENOUS SYRINGE
0.4000 mg | INJECTION | Freq: Once | INTRAVENOUS | Status: DC
Start: 2023-03-30 — End: 2023-03-31

## 2023-03-30 MED ORDER — REGADENOSON 0.4 MG/5 ML INTRAVENOUS SYRINGE
INJECTION | INTRAVENOUS | Status: AC
Start: 2023-03-30 — End: 2023-03-30
  Filled 2023-03-30: qty 5

## 2023-03-31 ENCOUNTER — Ambulatory Visit (HOSPITAL_COMMUNITY): Payer: Self-pay

## 2023-04-01 ENCOUNTER — Inpatient Hospital Stay (RURAL_HEALTH_CENTER): Payer: Self-pay | Admitting: Family Medicine

## 2023-04-05 ENCOUNTER — Ambulatory Visit (INDEPENDENT_AMBULATORY_CARE_PROVIDER_SITE_OTHER): Payer: Self-pay

## 2023-04-07 ENCOUNTER — Ambulatory Visit (INDEPENDENT_AMBULATORY_CARE_PROVIDER_SITE_OTHER): Payer: Commercial Managed Care - PPO

## 2023-04-07 ENCOUNTER — Other Ambulatory Visit: Payer: Self-pay

## 2023-04-07 DIAGNOSIS — J309 Allergic rhinitis, unspecified: Secondary | ICD-10-CM

## 2023-04-07 NOTE — Nursing Note (Signed)
04/07/23 1500   Vial A   Set 0   Injection Laterality Right   Injection Site Upper Arm   Dose  0.25   Time Observed 20 Minutes   Injection Site Reaction Negative   Vial B   Set 0   Injection Laterality Left   Injection Site Upper Arm   Dose  0.25   Time Observed 20 Minutes   Injection Site Reaction Negative     Merlinda Frederick, LPN

## 2023-04-12 ENCOUNTER — Ambulatory Visit (INDEPENDENT_AMBULATORY_CARE_PROVIDER_SITE_OTHER): Payer: Commercial Managed Care - PPO

## 2023-04-12 ENCOUNTER — Other Ambulatory Visit: Payer: Self-pay

## 2023-04-12 DIAGNOSIS — J309 Allergic rhinitis, unspecified: Secondary | ICD-10-CM

## 2023-04-12 NOTE — Nursing Note (Signed)
04/12/23 1600   Vial A   Set 0   Injection Laterality Left   Injection Site Upper Arm   Dose  0.3   Time Observed 20 Minutes   Injection Site Reaction Negative   Vial B   Set 0   Injection Laterality Right   Injection Site Upper Arm   Dose  0.3   Time Observed 20 Minutes   Injection Site Reaction Negative     Zaydon Kinser Carolynn Comment, LPN

## 2023-04-13 ENCOUNTER — Ambulatory Visit (INDEPENDENT_AMBULATORY_CARE_PROVIDER_SITE_OTHER): Payer: Self-pay

## 2023-04-16 IMAGING — CT CT SINUS W/O CONTRAST
3 of 4 series · 16 of 47 positions shown, 19 images · non-contrast
Comparison: None available.

﻿EXAM:  CT SINUS W/O CONTRAST
INDICATION: Chronic sinusitis. No prior surgery.
TECHNIQUE: CT was performed and reviewed in multiple projections.  Radiation dose 563 mGy cm.  Exam was performed using 1 or more of the following dose reduction techniques: Automated exposure control, adjustment of the mA and/or kV according to patient size, or the use of iterative reconstruction technique.

[axial · axial · 0.39mm/px · z∈[+452,+568]mm · 10 of 109 slices shown, 13 images]
[im 6/109  brain]
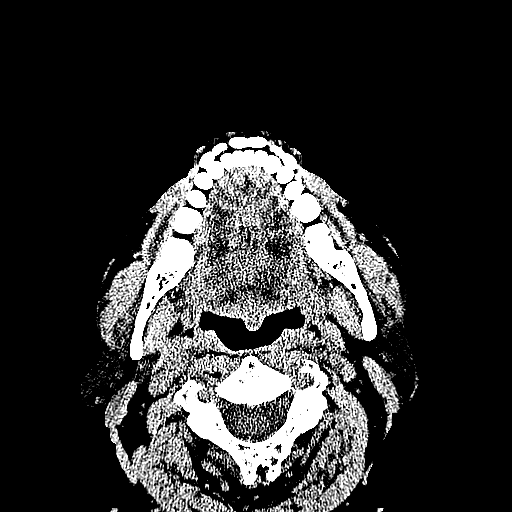
[im 6/109  bone]
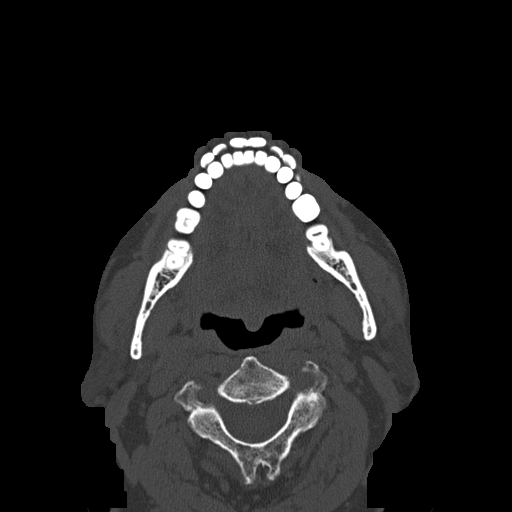
[im 17/109  bone]
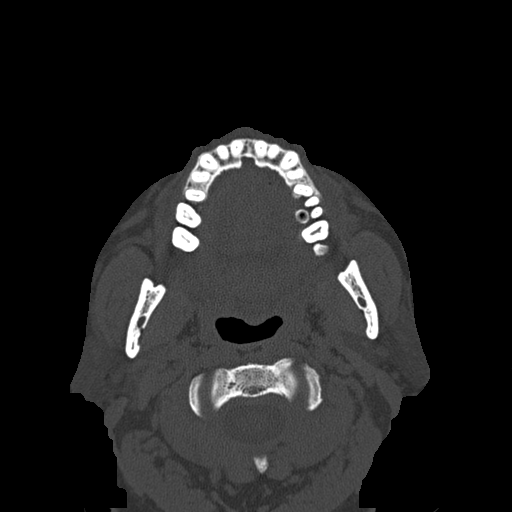
[im 28/109  bone]
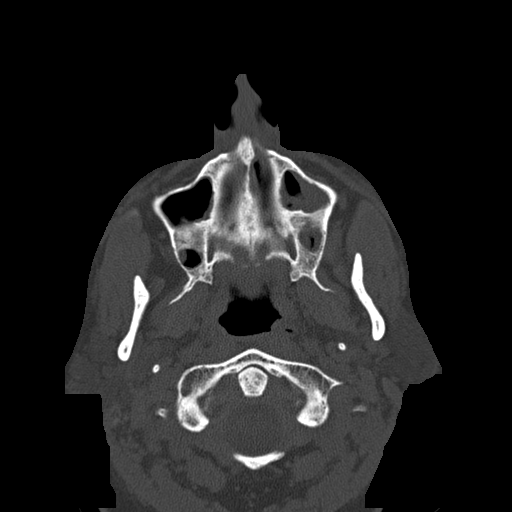
[im 38/109  bone]
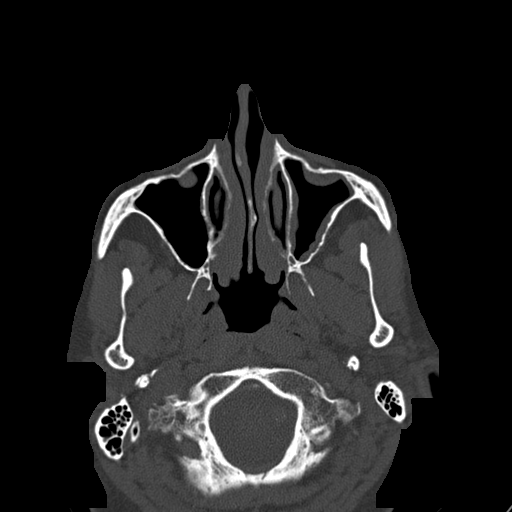
[im 49/109  brain]
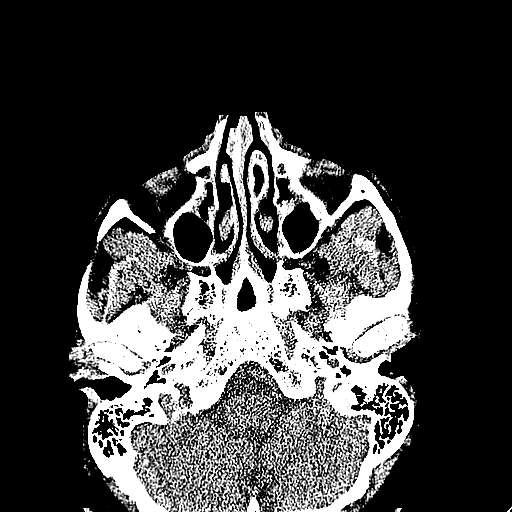
[im 49/109  bone]
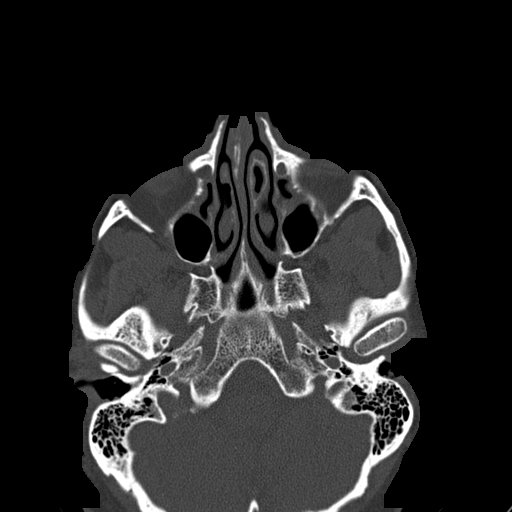
[im 60/109  bone]
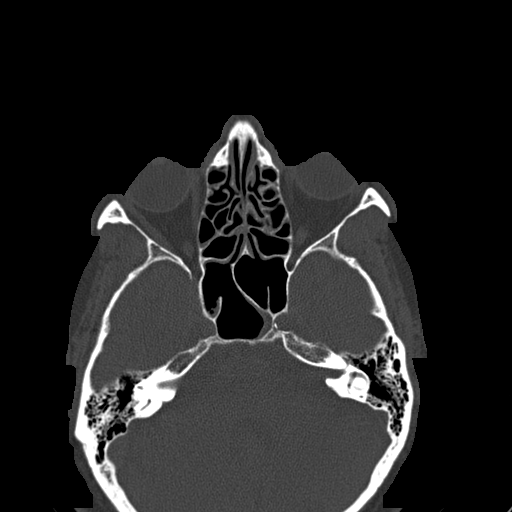
[im 71/109  bone]
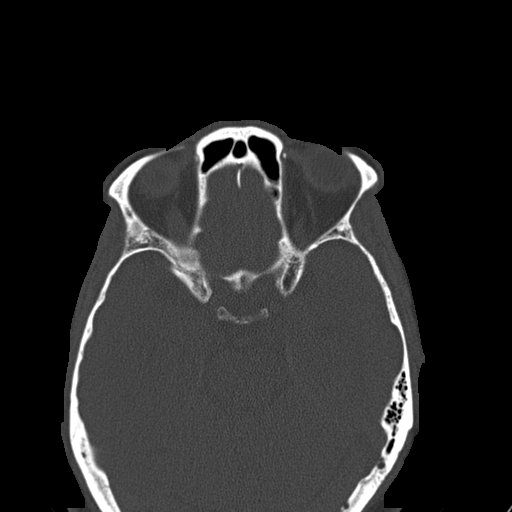
[im 82/109  bone]
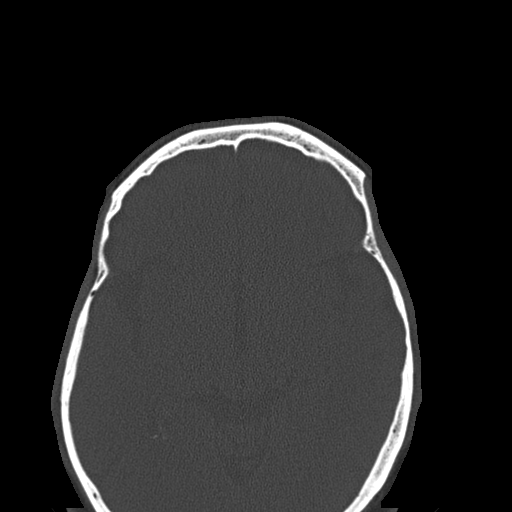
[im 92/109  brain]
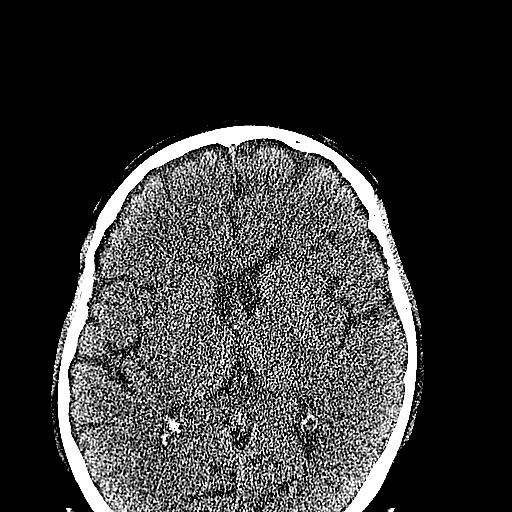
[im 92/109  bone]
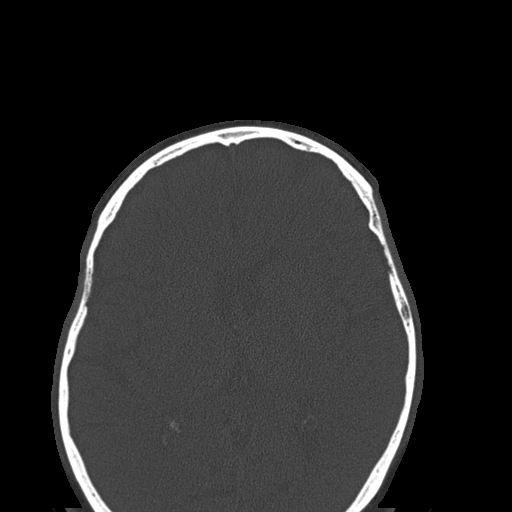
[im 103/109  bone]
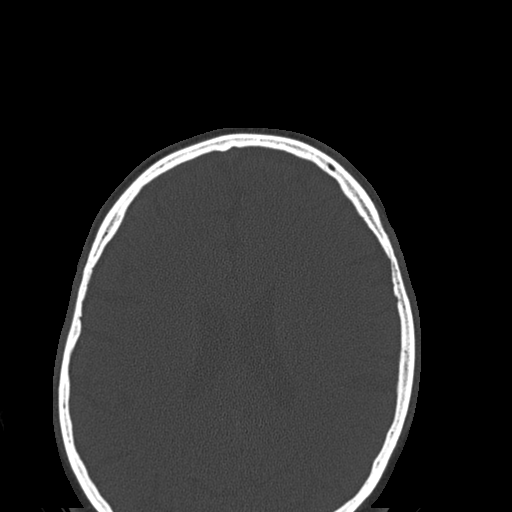

[cor · coronal · 0.39mm/px · 3 of 105 slices shown]
[im 35/105  bone]
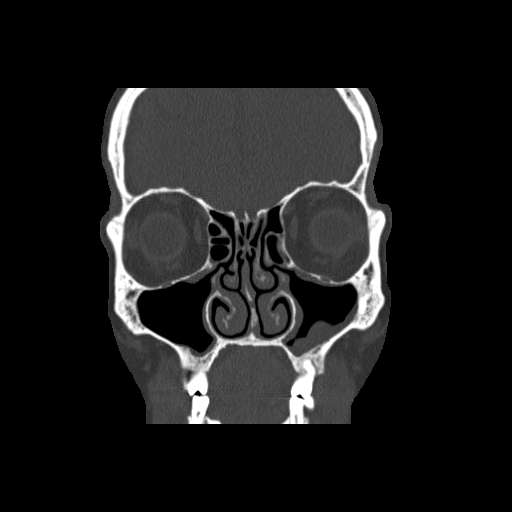
[im 47/105  bone]
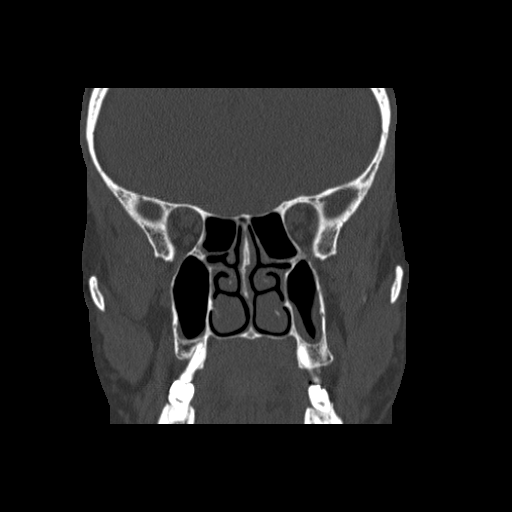
[im 58/105  bone]
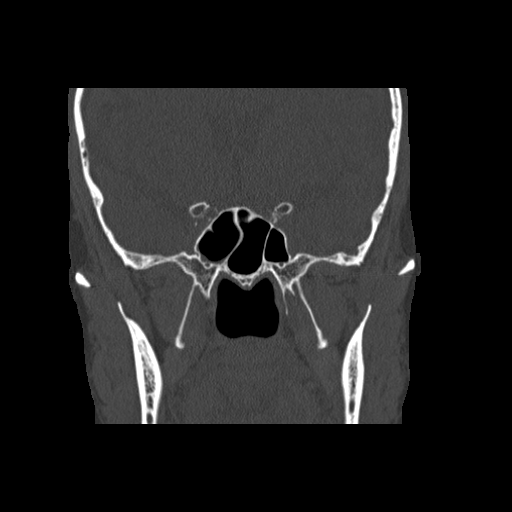

[sag · sagittal · 0.39mm/px · 3 of 89 slices shown]
[im 30/89  bone]
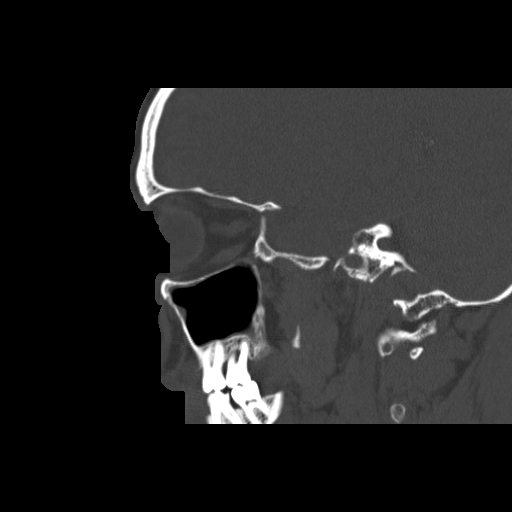
[im 45/89  bone]
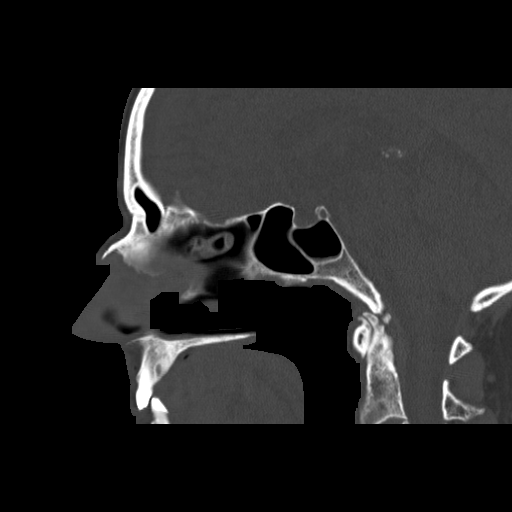
[im 59/89  bone]
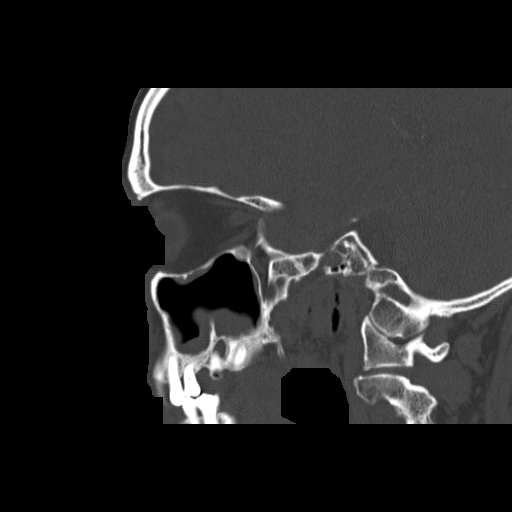

[16 of 47 positions shown; findings below may reference images not displayed]

FINDINGS: Significant mucoperiosteal inflammation of the left maxillary sinus is noted with lesser changes of right maxillary sinus. Edema of the sinus ostia are noted.  A 1 cm retention cyst of the right maxillary sinus is noted. 

Moderate mucoperiosteal inflammatory changes of ethmoid cells on both sides.  Frontal sinuses and sphenoid sinus are relatively normal. No focal bone changes are seen.

No focal lesions of nasal cavity are seen.
IMPRESSION: 1. Chronic mucoperiosteal inflammatory changes of maxillary sinuses, left more than the right and inflammatory changes of ethmoid cells on both sides.

2. Small retention cyst in right maxillary sinus. Edema of sinus ostia on both sides.

## 2023-04-20 ENCOUNTER — Other Ambulatory Visit: Payer: Self-pay

## 2023-04-20 ENCOUNTER — Ambulatory Visit (INDEPENDENT_AMBULATORY_CARE_PROVIDER_SITE_OTHER): Payer: Commercial Managed Care - PPO

## 2023-04-20 DIAGNOSIS — J309 Allergic rhinitis, unspecified: Secondary | ICD-10-CM

## 2023-04-20 NOTE — Nursing Note (Signed)
04/20/23 1400   Vial A   Set 0   Injection Laterality Right   Injection Site Upper Arm   Dose  0.35   Time Observed 20 Minutes   Injection Site Reaction Negative   Vial B   Set 0   Injection Laterality Left   Injection Site Upper Arm   Dose  0.35   Time Observed 20 Minutes   Injection Site Reaction Negative     Merlinda Frederick, LPN

## 2023-04-24 ENCOUNTER — Other Ambulatory Visit (RURAL_HEALTH_CENTER): Payer: Self-pay | Admitting: Family Medicine

## 2023-04-28 ENCOUNTER — Other Ambulatory Visit: Payer: Self-pay

## 2023-04-28 ENCOUNTER — Ambulatory Visit (INDEPENDENT_AMBULATORY_CARE_PROVIDER_SITE_OTHER): Payer: Commercial Managed Care - PPO

## 2023-04-28 DIAGNOSIS — J309 Allergic rhinitis, unspecified: Secondary | ICD-10-CM

## 2023-04-28 NOTE — Nursing Note (Signed)
04/28/23 1500   Vial A   Set 0   Injection Laterality Left   Injection Site Upper Arm   Dose  0.4   Time Observed 20 Minutes   Injection Site Reaction Negative   Vial B   Set 0   Injection Laterality Right   Injection Site Upper Arm   Dose  0.4   Time Observed 20 Minutes   Injection Site Reaction Negative     Merlinda Frederick, LPN

## 2023-05-03 ENCOUNTER — Ambulatory Visit (INDEPENDENT_AMBULATORY_CARE_PROVIDER_SITE_OTHER): Payer: Self-pay

## 2023-05-04 ENCOUNTER — Ambulatory Visit (INDEPENDENT_AMBULATORY_CARE_PROVIDER_SITE_OTHER): Payer: Commercial Managed Care - PPO | Admitting: Student in an Organized Health Care Education/Training Program

## 2023-05-04 ENCOUNTER — Other Ambulatory Visit: Payer: Self-pay

## 2023-05-04 VITALS — BP 144/59 | HR 64 | Ht 68.0 in | Wt 176.0 lb

## 2023-05-04 DIAGNOSIS — R350 Frequency of micturition: Secondary | ICD-10-CM

## 2023-05-04 DIAGNOSIS — N529 Male erectile dysfunction, unspecified: Secondary | ICD-10-CM

## 2023-05-04 DIAGNOSIS — N3941 Urge incontinence: Secondary | ICD-10-CM

## 2023-05-04 DIAGNOSIS — N3281 Overactive bladder: Secondary | ICD-10-CM

## 2023-05-04 DIAGNOSIS — N401 Enlarged prostate with lower urinary tract symptoms: Secondary | ICD-10-CM

## 2023-05-04 MED ORDER — TADALAFIL 20 MG TABLET
20.0000 mg | ORAL_TABLET | ORAL | 3 refills | Status: DC | PRN
Start: 2023-05-04 — End: 2024-04-04

## 2023-05-04 MED ORDER — TAMSULOSIN 0.4 MG CAPSULE
0.4000 mg | ORAL_CAPSULE | Freq: Every evening | ORAL | 3 refills | Status: DC
Start: 2023-05-04 — End: 2024-04-04

## 2023-05-04 NOTE — Progress Notes (Signed)
UROLOGY, [facility]  [address] [place] [address]    Progress Note    Name: Ricky Grimes MRN:  [id number]   Date: 05/04/2023 DOB:  [birth year] (72 y.o.)             Chief Complaint: New Patient and Urinary Frequency  Subjective   Subjective:   [name] is a pleasant 72 year old man who presents with urinary urgency and frequency. He was started on Gemtesa 75 mg however he took it intermittently and was unable to assess its efficacy.  He reports mild weakening of his urinary stream.  He also reports erectile dysfunction.  He has nitroglycerin sublingual tablets at home however he was not use them at home in years.  He has not tried medications for his prostate or for his erectile dysfunction in the past.  He denies a personal or family history of urologic malignancy.  He denies a history of urologic surgery. He denies fevers, chills, nausea, vomiting, hematuria, dysuria, flank pain, incontinence, dribbling, hesitancy, suprapubic pain, headaches, vision changes, shortness of breath, chest pain.          PROSTATE SPECIFIC ANTIGEN   Lab Results   Component Value Date/Time    PROSSPECAG 2.44 09/25/2022 10:33 AM              Objective   Objective :  BP (!) 144/59 (Site: Right, Patient Position: Sitting, Cuff Size: Adult)   Pulse 64   Ht 1.727 m (5\' 8" )   Wt 79.8 kg (176 lb)   BMI 26.76 kg/m       Gen: NAD, alert  Pulm: unlabored at rest  CV: palpable pulses  Abd: soft, Nt/ND  GU: no suprapubic tenderness, no CVAT    Data reviewed:    Current Outpatient Medications   Medication Sig    acyclovir (ZOVIRAX) 400 mg Oral Tablet TAKE 1 TABLET by mouth THREE TIMES DAILY    aspirin (ECOTRIN) 81 mg Oral Tablet, Delayed Release (E.C.) take 1 tablet every day for afib    baclofen (LIORESAL) 10 mg Oral Tablet Take 1 Tablet (10 mg total) by mouth Once a day (Patient taking differently: Take 1 Tablet (10 mg total) by mouth Per instructions As needed)    cholecalciferol, vitamin D3, 100 mcg (4,000 unit)  Oral Tablet Take 1 Tablet (4,000 Units total) by mouth Once a day for 90 days    clonazePAM (KLONOPIN) 1 mg Oral Tablet Take 1 Tablet (1 mg total) by mouth Twice daily Indications: Anxiety and sleep aid.    cloNIDine HCL (CATAPRES) 0.1 mg Oral Tablet Take 1 Tablet (0.1 mg total) by mouth Twice daily Indications: high blood pressure    dilTIAZem (CARDIZEM CD) 240 mg Oral Capsule, Sust. Release 24 hr Take 1 Capsule (240 mg total) by mouth Every morning Do not take if sbp<115 or heart rate<60    linaCLOtide (LINZESS) 145 mcg Oral Capsule Take 1 Capsule (145 mcg total) by mouth Every morning Indications: irritable bowel syndrome with constipation    lisinopriL (PRINIVIL) 20 mg Oral Tablet Take 1 Tablet (20 mg total) by mouth Once a day Do not take if sbp<115    loratadine (CLARITIN) 10 mg Oral Tablet Take 1 Tablet (10 mg total) by mouth Once per day as needed    montelukast (SINGULAIR) 10 mg Oral Tablet Take 1 Tablet (10 mg total) by mouth Once per day as needed    NALOXONE 4 MG/ACTUATION NASAL SPRAY - ED TO GO 1  Spray by INTRANASAL route Every 2 minutes as needed for actual or suspected opioid overdose. Call 911 if used.    nitroGLYCERIN (NITROSTAT) 0.4 mg Sublingual Tablet, Sublingual Place 1 Tablet (0.4 mg total) under the tongue Every 5 minutes as needed for Chest pain for 3 doses over 15 minutes    pantoprazole (PROTONIX) 40 mg Oral Tablet, Delayed Release (E.C.) Take 1 Tablet (40 mg total) by mouth Once a day    rimegepant (NURTEC ODT) 75 mg Oral Tablet, Rapid Dissolve Place 1 Tablet (75 mg total) under the tongue Once per day as needed for Other (Migraines)    rivaroxaban (XARELTO) 20 mg Oral Tablet Take 1 Tablet (20 mg total) by mouth Once a day for 90 days Indications: treatment to prevent blood clots in chronic atrial fibrillation    rosuvastatin (CRESTOR) 10 mg Oral Tablet Take 1 Tablet (10 mg total) by mouth Every evening    sucralfate (CARAFATE) 1 gram Oral Tablet Take 1 Tablet (1 g total) by mouth Every 6  hours    vibegron (GEMTESA) 75 mg Oral Tablet Take 1 Tablet (75 mg total) by mouth Once a day        Assessment/Plan  Problem List Items Addressed This Visit          Other    Urinary frequency     overactive bladder started on Gemtesa 75 mg  I discussed the differential diagnosis, pathophysiology and nature of patient's overactive bladder/urge urinary incontinence  I also counseled patient on conservative management options including appropriate fluid management, avoidance of diuretics including caffeine and alcohol, weight loss (if applicable), dedicated pelvic floor muscle therapy and Kegel's exercises  Additionally, we discussed the role of pharmacotherapy, including risks, benefits and alternatives:  Anticholinergic therapy (e.g. Oxybutynin, Tolterodine, Solifenacin, etc)- discussed potential risks of dry mouth, dry eyes, constipation, impaired cognition, prolonged Q-T interval and urinary retention  Beta-3 agonist therapy (e.g. Mirabegron) - discussed potential risks of hypertension, nasopharyngitis, urinary tract infection and headache  Continue virabegron (GemtesaT) 75 mg P.O. daily:    I have discussed in great detail with the patient the treatment of urge incontinence/overactive bladder symptoms using virabegron .  I have explained my rationale for using virabegron as well as potential risks of nasopharyngitis (3.9%), urinary tract infection (4.2%), and tachycardia (1.6%). Patient was also cautioned that full therapeutic efficacy may take up to twelve weeks.  Patient expressed an understanding of the treatment, possible reactions, and possible prognosis.      BPH/LUTS  Discussed the differential diagnosis, pathophysiology and nature of benign prostate enlargement causing lower urinary tract symptoms  Counseled patient on conservative management options including appropriate fluid management, avoidance of diuretics including caffeine and alcohol  Prescription provided for Tamsulosin Hydrochloride (FlomaxT)  0.4 mg P.O. daily:    I have discussed in great detail with the patient the treatment of prostate enlargement/lower urinary tract symptoms using tamsulosin.  I have explained my rationale for using tamsulosin as well as potential risks of asthenia (2%), dizziness (5%), rhinitis (3%) and abnormal ejaculation (11%). I encouraged patient to report any prior or current history of alpha-1 antagonist use to their opthalmic surgeon before undergoing any eye surgery due to the risk of intraoperative floppy iris syndrome.  The patient expressed an understanding of the treatment, possible reactions, and possible prognosis.      Erectile dysfunction  I discussed the differential diagnosis, pathophysiology and nature of erectile dysfunction, including contributing risk factors in patient's case  I counseled patient on lifestyle  modifications including regular cardiovascular exercise, tight glycemic control, maintenance of a healthy weight, moderate alcohol intake and smoking cessation, if applicable  Each of the various following erectile dysfunction treatment options, including risks, benefits and instructions on administration were provided to patient  Phosphodiesterase-5 inhibitor oral therapy (Sildenafil, Vardenafil, Tadalafil) - discussed potential risks of nasal congestion, headache, vision impairment including blue-green discoloration, and/or undesired therapeutic benefit  Intracavernous injection therapy (Caverject, Edex) & intraurethral suppository (MUSE) - discussed potential risks of priapism, penile scarring with subsequent curvature, dysuria, bleeding, pain and/or undesired therapeutic benefit  Vacuum erection device - discussed potential risks of bruising, skin breakdown, penile pain, anejaculation and temporary penile numbness  Penile prosthesis surgery - discussed risks of pain, urethral injury, prosthetic infection requiring explantation, device malfunction, and  patient/partner disatisfaction  Prescription  provided for Cialis 20 mg P.O. as needed prior to sexual activity:    I have discussed in great detail with the patient the treatment of erectile dysfunction using tadalafil.  I have explained my rationale for using tadalafil as well as potential dose-dependent risks of headache (11-15%), dyspepsia (8-10%), back pain (5-6%), and myalgia (3-4%%). Patient was also cautioned that in the event of an erection that persists longer than 4 hours, he should seek immediate medical assistance. If priapism is not treated immediately, penile tissue damage and permanent loss of potency could result.  Patient expressed an understanding of the treatment, possible reactions, and possible prognosis.  I discussed with the patient that erectile dysfunction oral agents are contraindicated those taken nitrate containing medications.  He was not taking any daily nitrate containing medications however he does have sublingual nitroglycerin at home should he have chest pain.  I informed him that should he require any nitrate containing medications he can not take it within 72 hours of his Cialis.  He verbalizes understanding of this and the risk of taking any nitrate containing medications with a PD E 11 inhibitor.      Prostate cancer screening  Based on patient's clinical findings including his recent prostate specific antigen level, age, ethnicity and relevant risk factors and in accordance with the American Urological Association (AUA) & National Comprehensive Cancer Network (NCCN) published screening guidelines, I would recommend the following:  Continued annual prostate specific antigen & digital rectal exam screening with cessation of screening based on estimated life expectancy of less than 5 years or upon developing more serious health issues.  PSA in 6 months       Arlana Hove, DO     A combined total of 45 minutes were spent preparing to see the patient, reviewing previous records, ordering tests/medications/procedures,  documenting the clinical encounter as well as performing a medically appropriate evaluation and independently interpreting results and communicating them to the patient/family/caregiver as specifically outlined above in the impression and plan.    This note may have been fully or partially generated using MModal Fluency Direct system, and there may be some incorrect words, spellings, and punctuation that were not identified in checking the note before saving.

## 2023-05-06 ENCOUNTER — Ambulatory Visit (INDEPENDENT_AMBULATORY_CARE_PROVIDER_SITE_OTHER): Payer: Commercial Managed Care - PPO

## 2023-05-10 ENCOUNTER — Other Ambulatory Visit (RURAL_HEALTH_CENTER): Payer: Self-pay | Admitting: Family Medicine

## 2023-05-10 ENCOUNTER — Ambulatory Visit (INDEPENDENT_AMBULATORY_CARE_PROVIDER_SITE_OTHER): Payer: Self-pay

## 2023-05-10 MED ORDER — TRIAMCINOLONE ACETONIDE 0.1 % TOPICAL OINTMENT
TOPICAL_OINTMENT | Freq: Two times a day (BID) | CUTANEOUS | 0 refills | Status: DC
Start: 2023-05-10 — End: 2023-10-27

## 2023-05-13 ENCOUNTER — Other Ambulatory Visit: Payer: Self-pay

## 2023-05-13 ENCOUNTER — Ambulatory Visit (RURAL_HEALTH_CENTER): Payer: Commercial Managed Care - PPO | Attending: Family Medicine

## 2023-05-13 DIAGNOSIS — T7840XA Allergy, unspecified, initial encounter: Secondary | ICD-10-CM | POA: Insufficient documentation

## 2023-05-13 MED ORDER — DEXAMETHASONE SODIUM PHOSPHATE 4 MG/ML INJECTION SOLUTION
10.0000 mg | INTRAMUSCULAR | Status: AC
Start: 2023-05-13 — End: 2023-05-13
  Administered 2023-05-13: 10 mg via INTRAMUSCULAR

## 2023-05-13 NOTE — Progress Notes (Signed)
Patientwas working outside yesterday and forgot to wear his mask. Severe runny nose dr bowling said to come for shot and he can take xyzal OTC daily, He came for shot this is given and he will call back next week if continued problems he is to use a mask in the future/br

## 2023-05-17 ENCOUNTER — Ambulatory Visit (INDEPENDENT_AMBULATORY_CARE_PROVIDER_SITE_OTHER): Payer: Self-pay

## 2023-05-24 ENCOUNTER — Ambulatory Visit (INDEPENDENT_AMBULATORY_CARE_PROVIDER_SITE_OTHER): Payer: Self-pay

## 2023-05-24 ENCOUNTER — Other Ambulatory Visit (INDEPENDENT_AMBULATORY_CARE_PROVIDER_SITE_OTHER): Payer: Commercial Managed Care - PPO | Admitting: OTOLARYNGOLOGY

## 2023-05-24 DIAGNOSIS — J3089 Other allergic rhinitis: Secondary | ICD-10-CM

## 2023-05-24 DIAGNOSIS — J301 Allergic rhinitis due to pollen: Secondary | ICD-10-CM

## 2023-05-24 NOTE — Nursing Note (Signed)
05/24/23 1400   ENT Immunotherapy Vial Preparation Flowsheet   Allergy Test Date 02/16/23   Managing Physician [place]   Initials rs   A   Preparation Date 05/24/23   Expiration Date 09/07/23   Immunotherapy Status No escalation   Diagnosis Allergic Rhinitis due to pollen (J30.1)   [place] Grass   Initial Endpoint - [place] Grass 5   Dilution of Antigen 3   Volume of Antigen 0.85ml   Ragweed   Initial Endpoint - Ragweed 5   Dilution of Antigen 3   Volume of Antigen 0.44ml   Maple   Initial Endpoint - Maple 3   Dilution of Antigen 1   Volume of Antigen 0.88ml   Pine   Initial Endpoint - Pine 3   Dilution of Antigen 1   Volume of Antigen 0.80ml   A   Concentrates A 0.8   10% Glycerine Diluent A 4.7   Total Volume A 5.5   B   Preparation Date 05/24/23   Expiration Date 09/07/23   Immunotherapy Status No escalation   Diagnosis Allergy to cockroach (Z91.038);Allergic Rhinitis due to mold (J30.89);Allergic Rhinitis due to dustmite (J30.89);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)   Sarocladium Strictum   Initial Endpoint - Sarocladium Strictum 3   Dilution of Antigen 1   Volume of Antigen 0.30ml   Corn Smut   Initial Endpoint - Corn Smut 3   Dilution of Antigen 1   Volume of Antigen 0.78ml   Mucor   Initial Endpoint - Mucor 3   Dilution of Antigen 1   Volume of Antigen 0.22ml   Cockroach   Initial Endpoint - Cockroach 3   Dilution of Antigen 1   Volume of Antigen 0.66ml   Cat   Initial Endpoint - Cat 2   Dilution of Antigen Concentrate   Volume of Antigen 0.67ml   D. Farinae Dust Mite   Initial Endpoint - D. Farinae Dust Mite 3   Dilution of Antigen Concentrate   Volume of Antigen 0.80ml   D. Pteronyssinus   Initial Endpoint - D. Pteronyssinus 3   Dilution of Antigen Concentrate   Volume of Antigen 0.11ml   B   Concentrates B 1.4   10% Glycerine Diluent B 4.1   Total Volume B 5.5     [name] Awe, LPN

## 2023-05-31 ENCOUNTER — Ambulatory Visit (INDEPENDENT_AMBULATORY_CARE_PROVIDER_SITE_OTHER): Payer: Commercial Managed Care - PPO

## 2023-05-31 ENCOUNTER — Encounter (INDEPENDENT_AMBULATORY_CARE_PROVIDER_SITE_OTHER): Payer: Self-pay | Admitting: NURSE PRACTITIONER

## 2023-05-31 ENCOUNTER — Ambulatory Visit (INDEPENDENT_AMBULATORY_CARE_PROVIDER_SITE_OTHER): Payer: Commercial Managed Care - PPO | Admitting: NURSE PRACTITIONER

## 2023-05-31 ENCOUNTER — Other Ambulatory Visit: Payer: Self-pay

## 2023-05-31 VITALS — Ht 68.0 in | Wt 175.0 lb

## 2023-05-31 DIAGNOSIS — J329 Chronic sinusitis, unspecified: Secondary | ICD-10-CM

## 2023-05-31 DIAGNOSIS — J309 Allergic rhinitis, unspecified: Secondary | ICD-10-CM

## 2023-05-31 DIAGNOSIS — J343 Hypertrophy of nasal turbinates: Secondary | ICD-10-CM

## 2023-05-31 MED ORDER — AZELASTINE 137 MCG (0.1 %) NASAL SPRAY
1.0000 | Freq: Two times a day (BID) | NASAL | 11 refills | Status: DC
Start: 2023-05-31 — End: 2024-07-05

## 2023-05-31 NOTE — Progress Notes (Signed)
ENT, [facility]  [address]  [place] [address]  Phone: [phone]  Fax: [phone]      Encounter Date: 05/31/2023    Patient ID: Ricky Grimes  MRN: [id number]    DOB: 20-Apr-1951  Age: 72 y.o. male     Progress Note       Referring Provider:  No ref. provider found    Reason for Visit:   Chief Complaint   Patient presents with    Allergies     3 mo rc, patient complains of itchy eyes, PND and nasal congestion        History of Present Illness:  Ricky Grimes is a 72 y.o. male follow up allergic rhinitis.  Started allergy injections 4 months ago.  Taking Singulair and Claritin daily.  No significant improvement in sinus/allergy symptoms    SET 02/16/23    Patient History:  Patient Active Problem List   Diagnosis    Chronic anticoagulation    Hyperlipidemia    Accelerated hypertension    Irritable bowel syndrome with constipation    Vitamin D deficiency    Vitamin B 12 deficiency    Urinary frequency    Allergic rhinitis    GERD with esophagitis    A-fib (CMS HCC)    Onychomycosis    It band syndrome, right    Prostate cancer screening    Chronic prescription benzodiazepine use    Sleeping difficulties    Migraines    Fever blister    Neck pain, chronic    Hematochezia    Sinusitis    Chronic kidney disease (CKD), active medical management without dialysis, stage 2 (mild)    Hip pain, right    Overweight    Actinic keratosis    Osteoarthritis of right hip    Pain in right hand    Trochanteric bursitis of right hip    Elevated troponin    Murmur     Current Outpatient Medications   Medication Sig    acyclovir (ZOVIRAX) 400 mg Oral Tablet TAKE 1 TABLET by mouth THREE TIMES DAILY    aspirin (ECOTRIN) 81 mg Oral Tablet, Delayed Release (E.C.) take 1 tablet every day for afib    azelastine (ASTELIN) 137 mcg (0.1 %) Nasal Aerosol, Spray Administer 1 Spray into each nostril Twice daily Use in each nostril as directed    baclofen (LIORESAL) 10 mg Oral Tablet Take 1 Tablet (10 mg total) by mouth Once a  day (Patient taking differently: Take 1 Tablet (10 mg total) by mouth Per instructions As needed)    cholecalciferol, vitamin D3, 100 mcg (4,000 unit) Oral Tablet Take 1 Tablet (4,000 Units total) by mouth Once a day for 90 days    clonazePAM (KLONOPIN) 1 mg Oral Tablet Take 1 Tablet (1 mg total) by mouth Twice daily Indications: Anxiety and sleep aid.    cloNIDine HCL (CATAPRES) 0.1 mg Oral Tablet Take 1 Tablet (0.1 mg total) by mouth Twice daily Indications: high blood pressure    dilTIAZem (CARDIZEM CD) 240 mg Oral Capsule, Sust. Release 24 hr Take 1 Capsule (240 mg total) by mouth Every morning Do not take if sbp<115 or heart rate<60    linaCLOtide (LINZESS) 145 mcg Oral Capsule Take 1 Capsule (145 mcg total) by mouth Every morning Indications: irritable bowel syndrome with constipation    lisinopriL (PRINIVIL) 20 mg Oral Tablet Take 1 Tablet (20 mg total) by mouth Once a day Do not take if sbp<115  loratadine (CLARITIN) 10 mg Oral Tablet Take 1 Tablet (10 mg total) by mouth Once per day as needed    montelukast (SINGULAIR) 10 mg Oral Tablet Take 1 Tablet (10 mg total) by mouth Once per day as needed    NALOXONE 4 MG/ACTUATION NASAL SPRAY - ED TO GO 1 Spray by INTRANASAL route Every 2 minutes as needed for actual or suspected opioid overdose. Call 911 if used.    nitroGLYCERIN (NITROSTAT) 0.4 mg Sublingual Tablet, Sublingual Place 1 Tablet (0.4 mg total) under the tongue Every 5 minutes as needed for Chest pain for 3 doses over 15 minutes    pantoprazole (PROTONIX) 40 mg Oral Tablet, Delayed Release (E.C.) Take 1 Tablet (40 mg total) by mouth Once a day    rimegepant (NURTEC ODT) 75 mg Oral Tablet, Rapid Dissolve Place 1 Tablet (75 mg total) under the tongue Once per day as needed for Other (Migraines)    rivaroxaban (XARELTO) 20 mg Oral Tablet Take 1 Tablet (20 mg total) by mouth Once a day for 90 days Indications: treatment to prevent blood clots in chronic atrial fibrillation    rosuvastatin (CRESTOR) 10 mg  Oral Tablet Take 1 Tablet (10 mg total) by mouth Every evening    sucralfate (CARAFATE) 1 gram Oral Tablet Take 1 Tablet (1 g total) by mouth Every 6 hours    tadalafil (CIALIS) 20 mg Oral Tablet Take 1 Tablet (20 mg total) by mouth Every 72 hours as needed    tamsulosin (FLOMAX) 0.4 mg Oral Capsule Take 1 Capsule (0.4 mg total) by mouth Every evening after dinner    triamcinolone acetonide 0.1 % Ointment Apply topically Twice daily    vibegron (GEMTESA) 75 mg Oral Tablet Take 1 Tablet (75 mg total) by mouth Once a day      Allergies   Allergen Reactions    Metoclopramide  Other Adverse Reaction (Add comment)     Shakes unable to be still     Past Medical History:   Diagnosis Date    Anticoagulant long-term use     Anxiety     Atrial fibrillation (CMS HCC)     Hypercholesterolemia     Hypertension     Migraine     Rectal bleeding     S/P rotator cuff repair     Wears glasses      Past Surgical History:   Procedure Laterality Date    KNEE ARTHROSCOPY Left      Family Medical History:       Problem Relation (Age of Onset)    Atrial fibrillation Father    Cancer Father    No Known Problems Mother, Sister, Brother, Half-Sister, Half-Brother, Maternal Aunt, Maternal Uncle, Paternal Aunt, Paternal Uncle, Maternal Grandmother, Maternal Grandfather, Paternal Grandmother, Paternal Grandfather, Daughter, Son            Social History     Tobacco Use    Smoking status: Never    Smokeless tobacco: Never   Vaping Use    Vaping status: Never Used   Substance Use Topics    Alcohol use: Yes     Alcohol/week: 12.0 standard drinks of alcohol     Types: 12 Cans of beer per week     Comment: per week    Drug use: Never       Review of Systems     Vitals:    05/31/23 1547   Weight: 79.4 kg (175 lb)   Height: 1.727 m (5\' 8" )  BMI: 26.66      ENT Physical Exam  Constitutional  Appearance: patient appears well-developed, well-nourished and well-groomed,  Communication/Voice: communication appropriate for developmental age; vocal quality  normal;  Head and Face  Appearance: head appears normal, face appears normal and face appears atraumatic;  Palpation: facial palpation normal;  Salivary: glands normal;  Ear  Hearing: intact;  Auricles: right auricle normal; left auricle normal;  External Mastoids: right external mastoid normal; left external mastoid normal;  Ear Canals: right ear canal normal; left ear canal normal;  Tympanic Membranes: right tympanic membrane normal; left tympanic membrane normal;  Nose  External Nose: nares patent bilaterally; nasal discharge visible;  Internal Nose: bilateral intranasal mucosa edematous; septum normal; bilateral inferior turbinates normal;  Oral Cavity/Oropharynx  Lips: normal;  Teeth: normal;  Gums: gingiva normal;  Tongue: normal;  Oral mucosa: normal;  Hard palate: normal;  Soft palate: normal;  Tonsils: normal;  Base of Tongue: normal;  Posterior pharyngeal wall: normal;  Neck  Neck: neck normal; neck palpation normal;  Thyroid: thyroid normal;  Respiratory  Inspection: breathing unlabored; normal breathing rate;  Lymphatic  Palpation: lymph nodes normal;  Neurovestibular  Mental Status: alert and oriented;  Psychiatric: mood normal; affect is appropriate;  Cranial Nerves: cranial nerves intact;       Assessment:  ENCOUNTER DIAGNOSES     ICD-10-CM   1. Allergic rhinitis, unspecified seasonality, unspecified trigger  J30.9   2. Chronic sinusitis, unspecified location  J32.9   3. Nasal turbinate hypertrophy  J34.3       Plan:  Medical records reviewed on 05/31/2023.  CT sinuses [facility] shows chronic mucosal thickening maxillary and ethmoid sinuses and small retention cyst right maxillary sinus.  There is also a concha bullosa on the left and turbinate hypertrophy  Discussed FESS and he does not want this done  Will continue allergy injections weekly   Start Astelin daily to BID        Orders Placed This Encounter    azelastine (ASTELIN) 137 mcg (0.1 %) Nasal Aerosol, Spray     No follow-ups on  file.    Elnora Morrison, FNP-BC  05/31/2023, 13:31

## 2023-05-31 NOTE — Nursing Note (Signed)
05/31/23 1600   Vial A   Set 0   Skin test/Wheal Size 0.63ml/ 7mm   Injection Laterality Right   Injection Site Upper Arm   Dose  0.15   Time Observed 30 Minutes   Injection Site Reaction Negative   Vial B   Set 0   Skin test/Wheal Size 0.77ml/ 7mm   Injection Laterality Left   Injection Site Upper Arm   Dose  0.15   Time Observed 30 Minutes   Injection Site Reaction Negative     Merlinda Frederick, LPN  I have reviewed the above allergy vial safety test, give injection.  Elnora Morrison, FNP-BC

## 2023-06-07 ENCOUNTER — Other Ambulatory Visit: Payer: Self-pay

## 2023-06-07 ENCOUNTER — Ambulatory Visit (INDEPENDENT_AMBULATORY_CARE_PROVIDER_SITE_OTHER): Payer: Commercial Managed Care - PPO

## 2023-06-07 DIAGNOSIS — J309 Allergic rhinitis, unspecified: Secondary | ICD-10-CM

## 2023-06-07 NOTE — Nursing Note (Signed)
06/07/23 1500   Vial A   Set 0   Injection Laterality Left   Injection Site Upper Arm   Dose  0.2   Time Observed 20 Minutes   Injection Site Reaction Negative   Vial B   Set 0   Injection Laterality Right   Injection Site Upper Arm   Dose  0.2   Time Observed 20 Minutes   Injection Site Reaction Negative     Jamale Spangler Carolynn Comment, LPN

## 2023-06-14 ENCOUNTER — Ambulatory Visit (INDEPENDENT_AMBULATORY_CARE_PROVIDER_SITE_OTHER): Payer: Commercial Managed Care - PPO

## 2023-06-14 ENCOUNTER — Other Ambulatory Visit: Payer: Self-pay

## 2023-06-14 DIAGNOSIS — J309 Allergic rhinitis, unspecified: Secondary | ICD-10-CM

## 2023-06-14 NOTE — Nursing Note (Signed)
06/14/23 1500   Vial A   Set 0   Injection Laterality Right   Injection Site Upper Arm   Dose  0.25   Time Observed 20 Minutes   Injection Site Reaction Negative   Vial B   Set 0   Injection Laterality Left   Injection Site Upper Arm   Dose  0.25   Time Observed 20 Minutes   Injection Site Reaction Negative     Jacier Gladu Carolynn Comment, LPN

## 2023-06-21 ENCOUNTER — Other Ambulatory Visit: Payer: Self-pay

## 2023-06-21 ENCOUNTER — Ambulatory Visit (INDEPENDENT_AMBULATORY_CARE_PROVIDER_SITE_OTHER): Payer: Commercial Managed Care - PPO

## 2023-06-21 DIAGNOSIS — J309 Allergic rhinitis, unspecified: Secondary | ICD-10-CM

## 2023-06-21 NOTE — Nursing Note (Signed)
06/21/23 1500   Vial A   Set 0   Injection Laterality Left   Injection Site Upper Arm   Dose  0.3   Time Observed 20 Minutes   Injection Site Reaction Negative   Vial B   Set 0   Injection Laterality Right   Injection Site Upper Arm   Dose  0.3   Time Observed 20 Minutes   Injection Site Reaction Negative     Orella Cushman Carolynn Comment, LPN

## 2023-06-28 ENCOUNTER — Other Ambulatory Visit: Payer: Self-pay

## 2023-06-28 ENCOUNTER — Ambulatory Visit (INDEPENDENT_AMBULATORY_CARE_PROVIDER_SITE_OTHER): Payer: Commercial Managed Care - PPO

## 2023-06-28 DIAGNOSIS — J309 Allergic rhinitis, unspecified: Secondary | ICD-10-CM

## 2023-06-28 NOTE — Nursing Note (Signed)
06/28/23 1500   Vial A   Set 0   Injection Laterality Right   Injection Site Upper Arm   Dose  0.35   Time Observed 20 Minutes   Injection Site Reaction Negative   Vial B   Set 0   Injection Laterality Left   Injection Site Upper Arm   Dose  0.35   Time Observed 20 Minutes   Injection Site Reaction Negative      Carolynn Comment, LPN

## 2023-07-05 ENCOUNTER — Ambulatory Visit (INDEPENDENT_AMBULATORY_CARE_PROVIDER_SITE_OTHER): Payer: Self-pay

## 2023-07-12 ENCOUNTER — Other Ambulatory Visit: Payer: Self-pay

## 2023-07-12 ENCOUNTER — Ambulatory Visit (INDEPENDENT_AMBULATORY_CARE_PROVIDER_SITE_OTHER): Payer: Commercial Managed Care - PPO

## 2023-07-12 DIAGNOSIS — J309 Allergic rhinitis, unspecified: Secondary | ICD-10-CM

## 2023-07-12 NOTE — Nursing Note (Signed)
07/12/23 1500   Vial A   Set 0   Injection Laterality Left   Injection Site Upper Arm   Dose  0.3   Time Observed 20 Minutes   Injection Site Reaction Negative   Vial B   Set 0   Injection Laterality Right   Injection Site Upper Arm   Dose  0.3   Time Observed 20 Minutes   Injection Site Reaction Negative      Carolynn Comment, LPN

## 2023-07-19 ENCOUNTER — Other Ambulatory Visit: Payer: Self-pay

## 2023-07-19 ENCOUNTER — Ambulatory Visit (INDEPENDENT_AMBULATORY_CARE_PROVIDER_SITE_OTHER): Payer: Commercial Managed Care - PPO

## 2023-07-19 DIAGNOSIS — J309 Allergic rhinitis, unspecified: Secondary | ICD-10-CM

## 2023-07-19 NOTE — Nursing Note (Signed)
07/19/23 1500   Vial A   Set 0   Injection Laterality Right   Injection Site Upper Arm   Dose  0.35   Time Observed 20 Minutes   Injection Site Reaction Negative   Vial B   Set 0   Injection Laterality Left   Injection Site Upper Arm   Dose  0.35   Time Observed 20 Minutes   Injection Site Reaction Negative     Merlinda Frederick, LPN

## 2023-07-23 ENCOUNTER — Other Ambulatory Visit (RURAL_HEALTH_CENTER): Payer: Self-pay | Admitting: Family Medicine

## 2023-07-26 ENCOUNTER — Ambulatory Visit (INDEPENDENT_AMBULATORY_CARE_PROVIDER_SITE_OTHER): Payer: Self-pay

## 2023-07-27 ENCOUNTER — Other Ambulatory Visit: Payer: Self-pay

## 2023-07-27 ENCOUNTER — Ambulatory Visit (INDEPENDENT_AMBULATORY_CARE_PROVIDER_SITE_OTHER): Payer: Commercial Managed Care - PPO

## 2023-07-27 ENCOUNTER — Other Ambulatory Visit (RURAL_HEALTH_CENTER): Payer: Self-pay | Admitting: Family Medicine

## 2023-07-27 DIAGNOSIS — J309 Allergic rhinitis, unspecified: Secondary | ICD-10-CM

## 2023-07-27 NOTE — Nursing Note (Signed)
07/27/23 1500   Vial A   Set 0   Injection Laterality Left   Injection Site Upper Arm   Dose  0.4   Time Observed 20 Minutes   Injection Site Reaction Negative   Vial B   Set 0   Injection Laterality Right   Injection Site Upper Arm   Dose  0.4   Time Observed 20 Minutes   Injection Site Reaction Negative     Merlinda Frederick, LPN

## 2023-08-03 ENCOUNTER — Other Ambulatory Visit: Payer: Self-pay

## 2023-08-03 ENCOUNTER — Ambulatory Visit (INDEPENDENT_AMBULATORY_CARE_PROVIDER_SITE_OTHER): Payer: Commercial Managed Care - PPO

## 2023-08-03 DIAGNOSIS — J309 Allergic rhinitis, unspecified: Secondary | ICD-10-CM

## 2023-08-03 NOTE — Nursing Note (Signed)
08/03/23 1600   Vial A   Set 0   Injection Laterality Right   Injection Site Upper Arm   Dose  0.45   Time Observed 20 Minutes   Injection Site Reaction Negative   Vial B   Set 0   Injection Laterality Left   Injection Site Upper Arm   Dose  0.45   Time Observed 20 Minutes   Injection Site Reaction Negative

## 2023-08-09 ENCOUNTER — Other Ambulatory Visit (INDEPENDENT_AMBULATORY_CARE_PROVIDER_SITE_OTHER): Payer: Self-pay | Admitting: Student in an Organized Health Care Education/Training Program

## 2023-08-09 DIAGNOSIS — N138 Other obstructive and reflux uropathy: Secondary | ICD-10-CM

## 2023-08-10 ENCOUNTER — Ambulatory Visit (INDEPENDENT_AMBULATORY_CARE_PROVIDER_SITE_OTHER): Payer: Commercial Managed Care - PPO

## 2023-08-10 ENCOUNTER — Encounter (INDEPENDENT_AMBULATORY_CARE_PROVIDER_SITE_OTHER): Payer: Self-pay | Admitting: Student in an Organized Health Care Education/Training Program

## 2023-08-10 ENCOUNTER — Other Ambulatory Visit: Payer: Self-pay

## 2023-08-10 DIAGNOSIS — J309 Allergic rhinitis, unspecified: Secondary | ICD-10-CM

## 2023-08-10 NOTE — Nursing Note (Signed)
08/10/23 1500   Vial A   Set 0   Injection Laterality Right   Injection Site Upper Arm   Dose  0.45   Time Observed 20 Minutes   Injection Site Reaction Negative   Vial B   Set 0   Injection Laterality Left   Injection Site Upper Arm   Dose  0.45   Time Observed 20 Minutes   Injection Site Reaction Negative     Merlinda Frederick, LPN

## 2023-08-17 ENCOUNTER — Ambulatory Visit (INDEPENDENT_AMBULATORY_CARE_PROVIDER_SITE_OTHER): Payer: Commercial Managed Care - PPO

## 2023-08-17 ENCOUNTER — Other Ambulatory Visit: Payer: Self-pay

## 2023-08-17 DIAGNOSIS — J309 Allergic rhinitis, unspecified: Secondary | ICD-10-CM

## 2023-08-17 NOTE — Nursing Note (Signed)
08/17/23 1500   Vial A   Set 0   Injection Laterality Left   Injection Site Upper Arm   Dose  0.5   Time Observed 20 Minutes   Injection Site Reaction Negative   Vial B   Set 0   Injection Laterality Right   Injection Site Upper Arm   Dose  0.5   Time Observed 20 Minutes   Injection Site Reaction Negative     Merlinda Frederick, LPN

## 2023-08-24 ENCOUNTER — Other Ambulatory Visit: Payer: Self-pay

## 2023-08-24 ENCOUNTER — Ambulatory Visit (INDEPENDENT_AMBULATORY_CARE_PROVIDER_SITE_OTHER): Payer: Commercial Managed Care - PPO

## 2023-08-24 ENCOUNTER — Other Ambulatory Visit (INDEPENDENT_AMBULATORY_CARE_PROVIDER_SITE_OTHER): Payer: Self-pay | Admitting: NURSE PRACTITIONER

## 2023-08-24 DIAGNOSIS — J301 Allergic rhinitis due to pollen: Secondary | ICD-10-CM

## 2023-08-24 DIAGNOSIS — J3089 Other allergic rhinitis: Secondary | ICD-10-CM

## 2023-08-24 DIAGNOSIS — J309 Allergic rhinitis, unspecified: Secondary | ICD-10-CM

## 2023-08-24 NOTE — Nursing Note (Signed)
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)   [place] Grass   Initial Endpoint - [place] Grass 5   Dilution of Antigen 2   Volume of Antigen 0.73ml   Ragweed   Initial Endpoint - Ragweed 5   Dilution of Antigen 2   Volume of Antigen 0.42ml   Maple   Initial Endpoint - Maple 3   Dilution of Antigen Concentrate   Volume of Antigen 0.3ml   Pine   Initial Endpoint - Pine 3   Dilution of Antigen Concentrate   Volume of Antigen 0.32ml   A   Concentrates A 0.8   10% Glycerine Diluent A 4.7   Total Volume A 5.5   B   Preparation Date 08/24/23   Expiration Date 12/08/23   Immunotherapy Status Increase   Diagnosis Allergy to cockroach (Z91.038);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);Allergic Rhinitis due to dustmite (J30.89);Allergic Rhinitis due to mold (J30.89)   Sarocladium Strictum   Initial Endpoint - Sarocladium Strictum 3   Dilution of Antigen Concentrate   Volume of Antigen 0.50ml   Corn Smut   Initial Endpoint - Corn Smut 3   Dilution of Antigen Concentrate   Volume of Antigen 0.34ml   Mucor   Initial Endpoint - Mucor 3   Dilution of Antigen Concentrate   Volume of Antigen 0.38ml   Cockroach   Initial Endpoint - Cockroach 3   Dilution of Antigen Concentrate   Volume of Antigen 0.50ml   Cat   Initial Endpoint - Cat 3   Dilution of Antigen Concentrate   Volume of Antigen 0.36ml   D. Farinae Dust Mite   Initial Endpoint - D. Farinae Dust Mite 3   Dilution of Antigen Concentrate   Volume of Antigen 0.20ml   D. Pteronyssinus   Initial Endpoint - D. Pteronyssinus 3   Dilution of Antigen Concentrate   Volume of Antigen 0.36ml   B   Concentrates B 1.4   Saline Diluent B 4.1   Total Volume B 5.5     [name] Awe, LPN

## 2023-08-24 NOTE — Nursing Note (Signed)
08/24/23 1500   Vial A   Set 0   Injection Laterality Right   Injection Site Upper Arm   Dose  0.5   Time Observed 20 Minutes   Injection Site Reaction Negative   Vial B   Set 0   Injection Laterality Left   Injection Site Upper Arm   Dose  0.5   Time Observed 20 Minutes   Injection Site Reaction Negative     Merlinda Frederick, LPN

## 2023-08-31 ENCOUNTER — Ambulatory Visit: Payer: Commercial Managed Care - PPO | Attending: NURSE PRACTITIONER

## 2023-08-31 ENCOUNTER — Other Ambulatory Visit: Payer: Self-pay

## 2023-08-31 DIAGNOSIS — J309 Allergic rhinitis, unspecified: Secondary | ICD-10-CM | POA: Insufficient documentation

## 2023-08-31 NOTE — Nursing Note (Signed)
08/31/23 1400   Vial A   Set 0   Injection Laterality Left   Injection Site Upper Arm   Dose  0.5   Time Observed 20 Minutes   Injection Site Reaction Negative   Vial B   Set 0   Injection Laterality Right   Injection Site Upper Arm   Dose  0.5   Time Observed 20 Minutes   Injection Site Reaction Negative     Merlinda Frederick, LPN

## 2023-09-07 ENCOUNTER — Ambulatory Visit (INDEPENDENT_AMBULATORY_CARE_PROVIDER_SITE_OTHER): Payer: Self-pay

## 2023-09-08 ENCOUNTER — Ambulatory Visit (INDEPENDENT_AMBULATORY_CARE_PROVIDER_SITE_OTHER): Payer: Commercial Managed Care - PPO

## 2023-09-09 ENCOUNTER — Other Ambulatory Visit: Payer: Self-pay

## 2023-09-09 ENCOUNTER — Ambulatory Visit: Payer: Commercial Managed Care - PPO | Attending: NURSE PRACTITIONER

## 2023-09-09 DIAGNOSIS — J309 Allergic rhinitis, unspecified: Secondary | ICD-10-CM

## 2023-09-09 NOTE — Nursing Note (Signed)
09/09/23 1500   Vial A   Set 1   Skin test/Wheal Size 0.71ml/ 7mm   Injection Laterality Right   Injection Site Upper Arm   Dose  0.05   Time Observed 30 Minutes   Injection Site Reaction Negative   Vial B   Set 1   Skin test/Wheal Size 0.64ml/ 7mm   Injection Laterality Left   Injection Site Upper Arm   Dose  0.05   Time Observed 30 Minutes   Injection Site Reaction Negative     Merlinda Frederick, LPN  I have reviewed the above allergy vial safety test, give injection.  Elnora Morrison, FNP-BC

## 2023-09-14 ENCOUNTER — Ambulatory Visit: Payer: Commercial Managed Care - PPO | Attending: NURSE PRACTITIONER

## 2023-09-14 ENCOUNTER — Other Ambulatory Visit: Payer: Self-pay

## 2023-09-14 ENCOUNTER — Ambulatory Visit (INDEPENDENT_AMBULATORY_CARE_PROVIDER_SITE_OTHER): Payer: Self-pay

## 2023-09-14 DIAGNOSIS — J309 Allergic rhinitis, unspecified: Secondary | ICD-10-CM | POA: Insufficient documentation

## 2023-09-14 NOTE — Nursing Note (Signed)
09/14/23 1400   Vial A   Set 1   Injection Laterality Left   Injection Site Upper Arm   Dose  0.1   Time Observed 20 Minutes   Injection Site Reaction Negative   Vial B   Set 1   Injection Laterality Right   Injection Site Upper Arm   Dose  0.1   Time Observed 20 Minutes   Injection Site Reaction Negative     Merlinda Frederick, LPN

## 2023-09-21 ENCOUNTER — Ambulatory Visit (INDEPENDENT_AMBULATORY_CARE_PROVIDER_SITE_OTHER): Payer: Self-pay

## 2023-09-21 ENCOUNTER — Other Ambulatory Visit: Payer: Self-pay

## 2023-09-21 ENCOUNTER — Ambulatory Visit: Payer: Commercial Managed Care - PPO | Attending: NURSE PRACTITIONER

## 2023-09-21 DIAGNOSIS — J309 Allergic rhinitis, unspecified: Secondary | ICD-10-CM | POA: Insufficient documentation

## 2023-09-21 NOTE — Nursing Note (Signed)
09/21/23 1500   Vial A   Set 1   Injection Laterality Right   Injection Site Upper Arm   Dose  0.15   Time Observed 20 Minutes   Injection Site Reaction Negative   Vial B   Set 1   Injection Laterality Left   Injection Site Upper Arm   Dose  0.15   Time Observed 20 Minutes   Injection Site Reaction Negative

## 2023-09-22 ENCOUNTER — Ambulatory Visit (RURAL_HEALTH_CENTER): Payer: Commercial Managed Care - PPO | Attending: Family Medicine | Admitting: Family Medicine

## 2023-09-22 ENCOUNTER — Encounter (RURAL_HEALTH_CENTER): Payer: Self-pay | Admitting: Family Medicine

## 2023-09-22 ENCOUNTER — Other Ambulatory Visit: Payer: Self-pay

## 2023-09-22 VITALS — BP 138/80 | HR 80 | Temp 99.4°F | Ht 68.0 in | Wt 179.0 lb

## 2023-09-22 DIAGNOSIS — J111 Influenza due to unidentified influenza virus with other respiratory manifestations: Secondary | ICD-10-CM | POA: Insufficient documentation

## 2023-09-22 DIAGNOSIS — J020 Streptococcal pharyngitis: Secondary | ICD-10-CM | POA: Insufficient documentation

## 2023-09-22 LAB — POCT RAPID STREP A: RAPID STREP A (POCT): POSITIVE

## 2023-09-22 LAB — POCT RAPID COVID (SOFIA) (AMB ONLY): COVID-19 AG: NEGATIVE

## 2023-09-22 MED ORDER — PROMETHAZINE-DM 6.25 MG-15 MG/5 ML ORAL SYRUP
5.0000 mL | ORAL_SOLUTION | Freq: Four times a day (QID) | ORAL | 1 refills | Status: DC | PRN
Start: 2023-09-22 — End: 2024-04-04

## 2023-09-22 MED ORDER — DOXYCYCLINE HYCLATE 100 MG TABLET
100.0000 mg | ORAL_TABLET | Freq: Two times a day (BID) | ORAL | 0 refills | Status: AC
Start: 2023-09-22 — End: 2023-10-02

## 2023-09-22 MED ORDER — OSELTAMIVIR 75 MG CAPSULE
75.0000 mg | ORAL_CAPSULE | Freq: Two times a day (BID) | ORAL | 0 refills | Status: DC
Start: 2023-09-22 — End: 2024-04-04

## 2023-09-22 NOTE — Progress Notes (Signed)
FAMILY MEDICINE, [facility] FAMILY MEDICINE [facility]  [address]  [place] [address]  Operated by [facility]     Name: Ricky Grimes MRN:  [id number]   Date of Birth: 08-Aug-1951 Age: 72 y.o.   Date: 09/22/2023  Time: 09:47     Provider: Weyman Pedro, DO    Assessment/Plan:    1. Flu  Prescribed Tamiflu.  Push fluids. F/u if sx fail to resolve or worsen    2. Strep pharyngitis     Prescribed doxycycline 100 mg po bid. F/U if sx fail to resolve or worsen. Change out toothbrush midway through antibiotic      No follow-ups on file.  Orders Placed This Encounter    doxycycline 100 mg Oral Tablet    oseltamivir (TAMIFLU) 75 mg Oral Capsule    promethazine-dextromethorphan (PHENERGAN-DM) 6.25-15 mg/5 mL Oral Syrup          Reason for visit: Feel Sick (Runny nose 2 weeks now cough for several days)      History of Present Illness:  Ricky Grimes is a 72 y.o. male presenting with runny nose. Sx for the past two weeks. Nose was primarily clear when sx started.   Developed a cough and fever over the past two days.  Patient Active Problem List   Diagnosis    Chronic anticoagulation    Hyperlipidemia    Accelerated hypertension    Irritable bowel syndrome with constipation    Vitamin D deficiency    Vitamin B 12 deficiency    Urinary frequency    Allergic rhinitis    GERD with esophagitis    A-fib (CMS HCC)    Onychomycosis    It band syndrome, right    Prostate cancer screening    Chronic prescription benzodiazepine use    Sleeping difficulties    Migraines    Fever blister    Neck pain, chronic    Hematochezia    Sinusitis    Chronic kidney disease (CKD), active medical management without dialysis, stage 2 (mild)    Hip pain, right    Overweight    Actinic keratosis    Osteoarthritis of right hip    Pain in right hand    Trochanteric bursitis of right hip    Elevated troponin    Murmur        Historical Data    Past Medical History:  Past Medical History:   Diagnosis Date     Anticoagulant long-term use     Anxiety     Atrial fibrillation (CMS HCC)     Hypercholesterolemia     Hypertension     Migraine     Rectal bleeding     S/P rotator cuff repair     Wears glasses          Past Surgical History:  Past Surgical History:   Procedure Laterality Date    KNEE ARTHROSCOPY Left          Allergies:  Allergies   Allergen Reactions    Metoclopramide  Other Adverse Reaction (Add comment)     Shakes unable to be still     Medications:  acyclovir (ZOVIRAX) 400 mg Oral Tablet, TAKE 1 TABLET by mouth THREE TIMES DAILY  aspirin (ECOTRIN) 81 mg Oral Tablet, Delayed Release (E.C.), take 1 tablet every day for afib  azelastine (ASTELIN) 137 mcg (0.1 %) Nasal Aerosol, Spray, Administer 1 Spray into each nostril Twice daily Use in each nostril as directed  baclofen (LIORESAL) 10 mg Oral Tablet, Take 1 Tablet (10 mg total) by mouth Once a day (Patient taking differently: Take 1 Tablet (10 mg total) by mouth Per instructions As needed)  cholecalciferol, vitamin D3, 100 mcg (4,000 unit) Oral Tablet, Take 1 Tablet (4,000 Units total) by mouth Once a day for 90 days  clonazePAM (KLONOPIN) 1 mg Oral Tablet, Take 1 Tablet (1 mg total) by mouth Twice daily Indications: Anxiety and sleep aid.  cloNIDine HCL (CATAPRES) 0.1 mg Oral Tablet, Take 1 Tablet (0.1 mg total) by mouth Twice daily Indications: high blood pressure  dilTIAZem (CARDIZEM CD) 240 mg Oral Capsule, Sust. Release 24 hr, Take 1 Capsule (240 mg total) by mouth Every morning Do not take if sbp<115 or heart rate<60  linaCLOtide (LINZESS) 145 mcg Oral Capsule, Take 1 Capsule (145 mcg total) by mouth Every morning Indications: irritable bowel syndrome with constipation  lisinopriL (PRINIVIL) 20 mg Oral Tablet, Take 1 Tablet (20 mg total) by mouth Once a day Do not take if sbp<115  loratadine (CLARITIN) 10 mg Oral Tablet, Take 1 Tablet (10 mg total) by mouth Once per day as needed  montelukast (SINGULAIR) 10 mg Oral Tablet, Take 1 Tablet (10 mg total) by  mouth Once per day as needed  NALOXONE 4 MG/ACTUATION NASAL SPRAY - ED TO GO, 1 Spray by INTRANASAL route Every 2 minutes as needed for actual or suspected opioid overdose. Call 911 if used.  nitroGLYCERIN (NITROSTAT) 0.4 mg Sublingual Tablet, Sublingual, Place 1 Tablet (0.4 mg total) under the tongue Every 5 minutes as needed for Chest pain for 3 doses over 15 minutes  pantoprazole (PROTONIX) 40 mg Oral Tablet, Delayed Release (E.C.), TAKE 1 TABLET by mouth DAILY  rimegepant (NURTEC ODT) 75 mg Oral Tablet, Rapid Dissolve, Place 1 Tablet (75 mg total) under the tongue Once per day as needed for Other (Migraines)  rivaroxaban (XARELTO) 20 mg Oral Tablet, Take 1 Tablet (20 mg total) by mouth Once a day for 90 days Indications: treatment to prevent blood clots in chronic atrial fibrillation  rosuvastatin (CRESTOR) 10 mg Oral Tablet, Take 1 Tablet (10 mg total) by mouth Every evening  sucralfate (CARAFATE) 1 gram Oral Tablet, Take 1 Tablet (1 g total) by mouth Every 6 hours  tadalafil (CIALIS) 20 mg Oral Tablet, Take 1 Tablet (20 mg total) by mouth Every 72 hours as needed  tamsulosin (FLOMAX) 0.4 mg Oral Capsule, Take 1 Capsule (0.4 mg total) by mouth Every evening after dinner  triamcinolone acetonide 0.1 % Ointment, Apply topically Twice daily  vibegron (GEMTESA) 75 mg Oral Tablet, Take 1 Tablet (75 mg total) by mouth Once a day    No facility-administered medications prior to visit.     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   Tobacco Use    Smoking status: Never    Smokeless tobacco: Never   Vaping Use  Vaping status: Never Used   Substance and Sexual Activity    Alcohol use: Yes     Alcohol/week: 12.0 standard drinks of alcohol     Types: 12 Cans of beer per week     Comment: per week    Drug use: Never    Sexual activity: Yes     Partners: Female     Birth control/protection: None     Social Determinants of Health     Financial Resource Strain: Low Risk  (03/16/2023)    Financial Resource Strain     SDOH Financial: No   Transportation Needs: Low Risk  (03/16/2023)    Transportation Needs     SDOH Transportation: No   Social Connections: Low Risk  (03/16/2023)    Social Connections     SDOH Social Isolation: 5 or more times a week   Intimate Partner Violence: Low Risk  (07/30/2022)    Intimate Partner Violence     SDOH Domestic Violence: No   Housing Stability: Low Risk  (03/16/2023)    Housing Stability     SDOH Housing Situation: I have housing.     SDOH Housing Worry: No           Review of Systems:  Any pertinent Review of Systems as addressed in the HPI above.    Physical Exam:  Vital Signs:  Vitals:    09/22/23 0914   BP: 138/80   Pulse: 80   Temp: 37.4 C (99.4 F)   TempSrc: Tympanic   SpO2: 98%   Weight: 81.2 kg (179 lb)   Height: 1.727 m (5\' 8" )   BMI: 27.27     Physical Exam  Vitals reviewed.   Constitutional:       General: He is not in acute distress.     Appearance: Normal appearance.   HENT:      Head: Normocephalic and atraumatic.      Right Ear: Tympanic membrane normal. There is no impacted cerumen.      Left Ear: Tympanic membrane normal. There is no impacted cerumen.      Nose: Congestion and rhinorrhea present.      Mouth/Throat:      Pharynx: Oropharyngeal exudate and posterior oropharyngeal erythema present.      Comments: Rapid strep and flu b is positive  Eyes:      General: No scleral icterus.        Right eye: No discharge.         Left eye: No discharge.   Neck:      Vascular: No  carotid bruit.   Cardiovascular:      Rate and Rhythm: Normal rate and regular rhythm.      Pulses: Normal pulses.      Heart sounds: Normal heart sounds.   Pulmonary:      Effort: Pulmonary effort is normal.      Breath sounds: Normal breath sounds.      Comments: Decreased breath sounds bilateral bases  Abdominal:      Palpations: Abdomen is soft.      Tenderness: There is no abdominal tenderness.   Musculoskeletal:      Right lower leg: No edema.      Left lower leg: No edema.   Lymphadenopathy:      Cervical: No cervical adenopathy.   Skin:     General: Skin is warm and dry.      Capillary Refill: Capillary refill takes less than 2 seconds.  Findings: No rash.   Neurological:      General: No focal deficit present.      Mental Status: He is alert and oriented to person, place, and time.                Weyman Pedro, DO     Portions of this note may be dictated using voice recognition software or a dictation service. Variances in spelling and vocabulary are possible and unintentional. Not all errors are caught/corrected. Please notify the Thereasa Parkin if any discrepancies are noted or if the meaning of any statement is not clear.

## 2023-09-24 LAB — POCT INFLUENZA A & B
INFLUENZA A: NEGATIVE
INFLUENZA B: POSITIVE

## 2023-09-26 ENCOUNTER — Encounter (RURAL_HEALTH_CENTER): Payer: Self-pay | Admitting: Family Medicine

## 2023-09-28 ENCOUNTER — Ambulatory Visit (INDEPENDENT_AMBULATORY_CARE_PROVIDER_SITE_OTHER): Payer: Self-pay

## 2023-09-28 ENCOUNTER — Ambulatory Visit: Payer: Commercial Managed Care - PPO | Attending: NURSE PRACTITIONER

## 2023-09-28 ENCOUNTER — Other Ambulatory Visit: Payer: Self-pay

## 2023-09-28 DIAGNOSIS — J309 Allergic rhinitis, unspecified: Secondary | ICD-10-CM | POA: Insufficient documentation

## 2023-09-28 NOTE — Nursing Note (Signed)
09/28/23 1400   Vial A   Set 1   Injection Laterality Left   Injection Site Upper Arm   Dose  0.2   Time Observed 20 Minutes   Injection Site Reaction Negative   Vial B   Set 1   Injection Laterality Right   Injection Site Upper Arm   Dose  0.2   Time Observed 20 Minutes   Injection Site Reaction Negative     Merlinda Frederick, LPN

## 2023-10-05 ENCOUNTER — Other Ambulatory Visit: Payer: Self-pay

## 2023-10-05 ENCOUNTER — Ambulatory Visit: Payer: Commercial Managed Care - PPO | Attending: NURSE PRACTITIONER

## 2023-10-05 DIAGNOSIS — J309 Allergic rhinitis, unspecified: Secondary | ICD-10-CM | POA: Insufficient documentation

## 2023-10-05 NOTE — Nursing Note (Signed)
10/05/23 1300   Vial A   Set 1   Injection Laterality Right   Injection Site Upper Arm   Dose  0.25   Time Observed 20 Minutes   Injection Site Reaction Negative   Vial B   Set 1   Injection Laterality Left   Injection Site Upper Arm   Dose  0.25   Time Observed 20 Minutes   Injection Site Reaction Negative     Merlinda Frederick, LPN

## 2023-10-13 ENCOUNTER — Ambulatory Visit (INDEPENDENT_AMBULATORY_CARE_PROVIDER_SITE_OTHER): Payer: Self-pay

## 2023-10-14 ENCOUNTER — Ambulatory Visit: Payer: Commercial Managed Care - PPO | Attending: NURSE PRACTITIONER

## 2023-10-14 ENCOUNTER — Ambulatory Visit (INDEPENDENT_AMBULATORY_CARE_PROVIDER_SITE_OTHER): Payer: Self-pay

## 2023-10-14 ENCOUNTER — Other Ambulatory Visit: Payer: Self-pay

## 2023-10-14 DIAGNOSIS — J309 Allergic rhinitis, unspecified: Secondary | ICD-10-CM | POA: Insufficient documentation

## 2023-10-14 NOTE — Nursing Note (Signed)
10/14/23 1300   Vial A   Set 1   Injection Laterality Left   Injection Site Upper Arm   Dose  0.3   Time Observed 20 Minutes   Injection Site Reaction Negative   Vial B   Set 1   Injection Laterality Right   Injection Site Upper Arm   Dose  0.3   Time Observed 20 Minutes   Injection Site Reaction Negative     Merlinda Frederick, LPN

## 2023-10-20 ENCOUNTER — Other Ambulatory Visit: Payer: Self-pay

## 2023-10-20 ENCOUNTER — Ambulatory Visit: Payer: Commercial Managed Care - PPO | Attending: NURSE PRACTITIONER

## 2023-10-20 DIAGNOSIS — J309 Allergic rhinitis, unspecified: Secondary | ICD-10-CM | POA: Insufficient documentation

## 2023-10-20 NOTE — Nursing Note (Signed)
10/20/23 1400   Vial A   Set 1   Injection Laterality Right   Injection Site Upper Arm   Dose  0.35   Time Observed 20 Minutes   Injection Site Reaction Negative   Vial B   Set 1   Injection Laterality Left   Injection Site Upper Arm   Dose  0.35   Time Observed 20 Minutes   Injection Site Reaction Negative     Merlinda Frederick, LPN

## 2023-10-27 ENCOUNTER — Other Ambulatory Visit (RURAL_HEALTH_CENTER): Payer: Self-pay | Admitting: Family Medicine

## 2023-10-27 ENCOUNTER — Ambulatory Visit (INDEPENDENT_AMBULATORY_CARE_PROVIDER_SITE_OTHER): Payer: Self-pay

## 2023-11-01 ENCOUNTER — Ambulatory Visit (INDEPENDENT_AMBULATORY_CARE_PROVIDER_SITE_OTHER): Payer: Self-pay

## 2023-11-02 ENCOUNTER — Ambulatory Visit (INDEPENDENT_AMBULATORY_CARE_PROVIDER_SITE_OTHER): Payer: Self-pay

## 2023-11-03 ENCOUNTER — Ambulatory Visit: Payer: Self-pay | Attending: NURSE PRACTITIONER

## 2023-11-03 ENCOUNTER — Other Ambulatory Visit: Payer: Self-pay

## 2023-11-03 DIAGNOSIS — J309 Allergic rhinitis, unspecified: Secondary | ICD-10-CM | POA: Insufficient documentation

## 2023-11-03 NOTE — Nursing Note (Signed)
11/03/23 1400   Vial A   Set 1   Injection Laterality Left   Injection Site Upper Arm   Dose  0.3   Time Observed 20 Minutes   Injection Site Reaction Negative   Vial B   Set 1   Injection Laterality Right   Injection Site Upper Arm   Dose  0.3   Time Observed 20 Minutes   Injection Site Reaction Negative     Merlinda Frederick, LPN

## 2023-11-10 ENCOUNTER — Ambulatory Visit (INDEPENDENT_AMBULATORY_CARE_PROVIDER_SITE_OTHER): Payer: Self-pay

## 2023-11-11 ENCOUNTER — Ambulatory Visit: Payer: Commercial Managed Care - PPO | Attending: NURSE PRACTITIONER

## 2023-11-11 ENCOUNTER — Other Ambulatory Visit: Payer: Self-pay

## 2023-11-11 DIAGNOSIS — J309 Allergic rhinitis, unspecified: Secondary | ICD-10-CM | POA: Insufficient documentation

## 2023-11-11 NOTE — Nursing Note (Signed)
11/11/23 1500   Vial A   Set 1   Injection Laterality Right   Injection Site Upper Arm   Dose  0.35   Time Observed 20 Minutes   Injection Site Reaction Negative   Vial B   Set 1   Injection Laterality Left   Injection Site Upper Arm   Dose  0.35   Time Observed 20 Minutes   Injection Site Reaction Negative     Merlinda Frederick, LPN

## 2023-11-25 ENCOUNTER — Ambulatory Visit: Payer: Commercial Managed Care - PPO | Attending: OTOLARYNGOLOGY

## 2023-11-25 ENCOUNTER — Other Ambulatory Visit: Payer: Self-pay

## 2023-11-25 DIAGNOSIS — J309 Allergic rhinitis, unspecified: Secondary | ICD-10-CM | POA: Insufficient documentation

## 2023-11-25 NOTE — Nursing Note (Signed)
11/25/23 1500   Vial A   Set 1   Injection Laterality Left   Injection Site Upper Arm   Dose  0.3   Time Observed 20 Minutes   Injection Site Reaction Negative   Vial B   Set 1   Injection Laterality Right   Injection Site Upper Arm   Dose  0.3   Time Observed 20 Minutes   Injection Site Reaction Negative

## 2023-11-26 ENCOUNTER — Other Ambulatory Visit (RURAL_HEALTH_CENTER): Payer: Self-pay | Admitting: Family

## 2023-11-28 NOTE — Telephone Encounter (Signed)
This patient is on a higher dose

## 2023-11-29 ENCOUNTER — Other Ambulatory Visit (RURAL_HEALTH_CENTER): Payer: Self-pay | Admitting: Family Medicine

## 2023-11-29 ENCOUNTER — Encounter (INDEPENDENT_AMBULATORY_CARE_PROVIDER_SITE_OTHER): Payer: Self-pay

## 2023-11-29 ENCOUNTER — Ambulatory Visit: Payer: Commercial Managed Care - PPO | Attending: NURSE PRACTITIONER

## 2023-11-29 ENCOUNTER — Ambulatory Visit (INDEPENDENT_AMBULATORY_CARE_PROVIDER_SITE_OTHER): Payer: Self-pay

## 2023-11-29 ENCOUNTER — Ambulatory Visit (HOSPITAL_BASED_OUTPATIENT_CLINIC_OR_DEPARTMENT_OTHER): Payer: Commercial Managed Care - PPO

## 2023-11-29 ENCOUNTER — Other Ambulatory Visit: Payer: Self-pay

## 2023-11-29 DIAGNOSIS — J301 Allergic rhinitis due to pollen: Secondary | ICD-10-CM | POA: Insufficient documentation

## 2023-11-29 DIAGNOSIS — J3089 Other allergic rhinitis: Secondary | ICD-10-CM | POA: Insufficient documentation

## 2023-11-29 NOTE — Nursing Note (Signed)
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)   [place] Grass   Initial Endpoint - [place] Grass 5   Dilution of Antigen 2   Volume of Antigen 0.43ml   Ragweed   Initial Endpoint - Ragweed 5   Dilution of Antigen 2   Volume of Antigen 0.42ml   Maple   Initial Endpoint - Maple 3   Dilution of Antigen Concentrate   Volume of Antigen 0.15ml   Pine   Initial Endpoint - Pine 3   Dilution of Antigen Concentrate   Volume of Antigen 0.17ml   A   Concentrates A 0.8   10% Glycerine Diluent A 4.7   Total Volume A 5.5   B   Preparation Date 11/29/23   Expiration Date 03/07/24   Immunotherapy Status Concentrate/Maintenance   Diagnosis Allergy to cockroach (Z91.038);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);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 Farinae Dust Mite   Initial Endpoint - D. Farinae Dust Mite 3   Dilution of Antigen Concentrate   Volume of Antigen 0.76ml   D. Pteronyssinus   Initial Endpoint - D. Pteronyssinus 3   Dilution of Antigen Concentrate   Volume of Antigen 0.71ml   B   Concentrates B 1.4   Saline Diluent B 4.1   Total Volume B 5.5     [name] Awe, LPN

## 2023-12-02 ENCOUNTER — Ambulatory Visit (INDEPENDENT_AMBULATORY_CARE_PROVIDER_SITE_OTHER): Payer: Self-pay

## 2023-12-21 ENCOUNTER — Other Ambulatory Visit (RURAL_HEALTH_CENTER): Payer: Self-pay | Admitting: Family Medicine

## 2023-12-21 DIAGNOSIS — J3489 Other specified disorders of nose and nasal sinuses: Secondary | ICD-10-CM

## 2023-12-22 ENCOUNTER — Other Ambulatory Visit: Payer: Self-pay

## 2023-12-22 ENCOUNTER — Ambulatory Visit: Payer: Commercial Managed Care - PPO | Attending: Family Medicine

## 2023-12-22 DIAGNOSIS — J309 Allergic rhinitis, unspecified: Secondary | ICD-10-CM | POA: Insufficient documentation

## 2023-12-22 NOTE — Nursing Note (Signed)
12/22/23 1400   Vial A   Set 1   Injection Laterality Right   Injection Site Upper Arm   Dose  0.1   Time Observed 30 Minutes   Injection Site Reaction Negative   Vial B   Set 1   Injection Laterality Left   Injection Site Upper Arm   Dose  0.1   Time Observed 30 Minutes   Injection Site Reaction Negative     Merlinda Frederick, LPN

## 2023-12-30 ENCOUNTER — Ambulatory Visit (INDEPENDENT_AMBULATORY_CARE_PROVIDER_SITE_OTHER): Payer: Self-pay

## 2023-12-30 ENCOUNTER — Ambulatory Visit: Payer: Commercial Managed Care - PPO | Attending: NURSE PRACTITIONER

## 2023-12-30 ENCOUNTER — Other Ambulatory Visit: Payer: Self-pay

## 2023-12-30 DIAGNOSIS — J309 Allergic rhinitis, unspecified: Secondary | ICD-10-CM | POA: Insufficient documentation

## 2023-12-30 NOTE — Nursing Note (Signed)
12/30/23 1400   Vial A   Set 1   Injection Laterality Left   Injection Site Upper Arm   Dose  0.15   Time Observed 20 Minutes   Injection Site Reaction Negative   Vial B   Set 1   Injection Laterality Right   Injection Site Upper Arm   Dose  0.15   Time Observed 20 Minutes   Injection Site Reaction Negative     Merlinda Frederick, LPN

## 2024-01-03 ENCOUNTER — Other Ambulatory Visit (RURAL_HEALTH_CENTER): Payer: Self-pay | Admitting: Family Medicine

## 2024-01-03 NOTE — Telephone Encounter (Signed)
Crestor filled but needs labs and appt will scheduled/br

## 2024-01-27 ENCOUNTER — Other Ambulatory Visit (RURAL_HEALTH_CENTER): Payer: Self-pay | Admitting: Family Medicine

## 2024-01-29 ENCOUNTER — Other Ambulatory Visit (RURAL_HEALTH_CENTER): Payer: Self-pay | Admitting: Family Medicine

## 2024-01-29 DIAGNOSIS — J3489 Other specified disorders of nose and nasal sinuses: Secondary | ICD-10-CM

## 2024-02-01 ENCOUNTER — Ambulatory Visit: Payer: Self-pay | Attending: NURSE PRACTITIONER

## 2024-02-01 ENCOUNTER — Other Ambulatory Visit: Payer: Self-pay

## 2024-02-01 DIAGNOSIS — J309 Allergic rhinitis, unspecified: Secondary | ICD-10-CM | POA: Insufficient documentation

## 2024-02-01 NOTE — Nursing Note (Signed)
 02/01/24 1400   Vial A   Set 1   Skin test/Wheal Size 0.59ml/ 7mm   Injection Laterality Right   Injection Site Upper Arm   Dose  0.05   Time Observed 30 Minutes   Injection Site Reaction Negative   Vial B   Set 1   Skin test/Wheal Size 0.23ml/ 7mm   Injection Laterality Left   Injection Site Upper Arm   Dose  0.05   Time Observed 30 Minutes   Injection Site Reaction Negative     Merlinda Frederick, LPN

## 2024-02-13 ENCOUNTER — Other Ambulatory Visit (RURAL_HEALTH_CENTER): Payer: Self-pay | Admitting: Family Medicine

## 2024-02-22 ENCOUNTER — Ambulatory Visit: Attending: NURSE PRACTITIONER

## 2024-02-22 ENCOUNTER — Other Ambulatory Visit: Payer: Self-pay

## 2024-02-22 DIAGNOSIS — J3089 Other allergic rhinitis: Secondary | ICD-10-CM | POA: Insufficient documentation

## 2024-02-22 DIAGNOSIS — J301 Allergic rhinitis due to pollen: Secondary | ICD-10-CM | POA: Insufficient documentation

## 2024-02-22 NOTE — Nursing Note (Signed)
 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)   [place] Grass   Initial Endpoint - [place] Grass 5   Dilution of Antigen 2   Volume of Antigen 0.71ml   Ragweed   Initial Endpoint - Ragweed 5   Dilution of Antigen 2   Volume of Antigen 0.65ml   Maple   Initial Endpoint - Maple 3   Dilution of Antigen Concentrate   Volume of Antigen 0.1ml   Pine   Initial Endpoint - Pine 3   Dilution of Antigen Concentrate   Volume of Antigen 0.42ml   A   Concentrates A 0.8   10% Glycerine Diluent  A 4.7   Total Volume A 5.5   B   Preparation Date 02/22/24   Expiration Date 06/06/24   Immunotherapy Status Concentrate/Maintenance   Diagnosis Allergy  to cockroach (Z91.038);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);Allergic Rhinitis due to dustmite (J30.89);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 Farinae Dust Mite   Initial Endpoint - D. Farinae Dust Mite 3   Dilution of Antigen Concentrate   Volume of Antigen 0.20ml   D. Pteronyssinus   Initial Endpoint - D. Pteronyssinus 3   Dilution of Antigen Concentrate   Volume of Antigen 0.80ml   B   Concentrates B 1.4   Saline Diluent  B 4.1   Total Volume B 5.5     Darrel Elm, LPN

## 2024-03-02 ENCOUNTER — Other Ambulatory Visit: Payer: Self-pay

## 2024-03-02 ENCOUNTER — Ambulatory Visit: Attending: OTOLARYNGOLOGY

## 2024-03-02 DIAGNOSIS — J309 Allergic rhinitis, unspecified: Secondary | ICD-10-CM | POA: Insufficient documentation

## 2024-03-02 NOTE — Nursing Note (Signed)
 03/02/24 1500   Vial A   Set 1   Skin test/Wheal Size 0.10ml/ 8mm   Injection Laterality Right   Injection Site Upper Arm   Dose  0.05   Time Observed 30 Minutes   Injection Site Reaction Negative   Vial B   Set 1   Skin test/Wheal Size 0.57ml/ 7mm   Injection Laterality Left   Injection Site Upper Arm   Dose  0.05   Time Observed 30 Minutes   Injection Site Reaction Negative     Carey Chapman, LPN

## 2024-03-07 ENCOUNTER — Other Ambulatory Visit: Payer: Self-pay

## 2024-03-07 ENCOUNTER — Ambulatory Visit: Attending: NURSE PRACTITIONER

## 2024-03-07 DIAGNOSIS — J309 Allergic rhinitis, unspecified: Secondary | ICD-10-CM | POA: Insufficient documentation

## 2024-03-07 NOTE — Nursing Note (Signed)
 03/07/24 1400   Vial A   Set 1   Injection Laterality Left   Injection Site Upper Arm   Dose  0.1   Time Observed 20 Minutes   Injection Site Reaction Negative   Vial B   Set 1   Injection Laterality Right   Injection Site Upper Arm   Dose  0.1   Time Observed 20 Minutes   Injection Site Reaction Negative     Merlinda Frederick, LPN

## 2024-03-13 ENCOUNTER — Encounter (INDEPENDENT_AMBULATORY_CARE_PROVIDER_SITE_OTHER): Payer: Self-pay

## 2024-03-30 ENCOUNTER — Other Ambulatory Visit (RURAL_HEALTH_CENTER): Payer: Self-pay | Admitting: Family Medicine

## 2024-03-30 DIAGNOSIS — J3489 Other specified disorders of nose and nasal sinuses: Secondary | ICD-10-CM

## 2024-03-30 NOTE — Telephone Encounter (Signed)
 Has been over one year since regular appt has been seen for sick but not regular visit must have regular appt before medication  given

## 2024-04-04 ENCOUNTER — Other Ambulatory Visit: Payer: Self-pay

## 2024-04-04 ENCOUNTER — Encounter (RURAL_HEALTH_CENTER): Payer: Self-pay | Admitting: Family Medicine

## 2024-04-04 ENCOUNTER — Ambulatory Visit (RURAL_HEALTH_CENTER): Attending: Family Medicine | Admitting: Family Medicine

## 2024-04-04 ENCOUNTER — Ambulatory Visit (RURAL_HEALTH_CENTER): Payer: Self-pay | Admitting: Family Medicine

## 2024-04-04 VITALS — BP 136/80 | HR 65 | Temp 97.1°F | Ht 68.0 in | Wt 177.0 lb

## 2024-04-04 DIAGNOSIS — K581 Irritable bowel syndrome with constipation: Secondary | ICD-10-CM | POA: Insufficient documentation

## 2024-04-04 DIAGNOSIS — E785 Hyperlipidemia, unspecified: Secondary | ICD-10-CM | POA: Insufficient documentation

## 2024-04-04 DIAGNOSIS — E538 Deficiency of other specified B group vitamins: Secondary | ICD-10-CM | POA: Insufficient documentation

## 2024-04-04 DIAGNOSIS — J3489 Other specified disorders of nose and nasal sinuses: Secondary | ICD-10-CM | POA: Insufficient documentation

## 2024-04-04 DIAGNOSIS — I480 Paroxysmal atrial fibrillation: Secondary | ICD-10-CM | POA: Insufficient documentation

## 2024-04-04 DIAGNOSIS — Z79899 Other long term (current) drug therapy: Secondary | ICD-10-CM | POA: Insufficient documentation

## 2024-04-04 DIAGNOSIS — Z1159 Encounter for screening for other viral diseases: Secondary | ICD-10-CM | POA: Insufficient documentation

## 2024-04-04 DIAGNOSIS — E611 Iron deficiency: Secondary | ICD-10-CM | POA: Insufficient documentation

## 2024-04-04 DIAGNOSIS — E559 Vitamin D deficiency, unspecified: Secondary | ICD-10-CM | POA: Insufficient documentation

## 2024-04-04 DIAGNOSIS — R350 Frequency of micturition: Secondary | ICD-10-CM | POA: Insufficient documentation

## 2024-04-04 DIAGNOSIS — R739 Hyperglycemia, unspecified: Secondary | ICD-10-CM | POA: Insufficient documentation

## 2024-04-04 DIAGNOSIS — G43E09 Chronic migraine with aura, not intractable, without status migrainosus: Secondary | ICD-10-CM | POA: Insufficient documentation

## 2024-04-04 DIAGNOSIS — M25551 Pain in right hip: Secondary | ICD-10-CM | POA: Insufficient documentation

## 2024-04-04 DIAGNOSIS — R351 Nocturia: Secondary | ICD-10-CM | POA: Insufficient documentation

## 2024-04-04 DIAGNOSIS — D485 Neoplasm of uncertain behavior of skin: Secondary | ICD-10-CM | POA: Insufficient documentation

## 2024-04-04 DIAGNOSIS — N182 Chronic kidney disease, stage 2 (mild): Secondary | ICD-10-CM | POA: Insufficient documentation

## 2024-04-04 DIAGNOSIS — I4891 Unspecified atrial fibrillation: Secondary | ICD-10-CM | POA: Insufficient documentation

## 2024-04-04 MED ORDER — TAMSULOSIN 0.4 MG CAPSULE
0.4000 mg | ORAL_CAPSULE | Freq: Every evening | ORAL | 3 refills | Status: DC
Start: 2024-04-04 — End: 2024-07-05

## 2024-04-04 MED ORDER — ACYCLOVIR 400 MG TABLET
400.0000 mg | ORAL_TABLET | Freq: Three times a day (TID) | ORAL | 5 refills | Status: DC
Start: 2024-04-04 — End: 2024-07-05

## 2024-04-04 MED ORDER — LORATADINE 10 MG TABLET
10.0000 mg | ORAL_TABLET | Freq: Every day | ORAL | 0 refills | Status: DC | PRN
Start: 2024-04-04 — End: 2024-05-09

## 2024-04-04 MED ORDER — CLONAZEPAM 1 MG TABLET
1.0000 mg | ORAL_TABLET | Freq: Two times a day (BID) | ORAL | 3 refills | Status: DC
Start: 2024-04-04 — End: 2024-07-05

## 2024-04-04 MED ORDER — RIVAROXABAN 20 MG TABLET
20.0000 mg | ORAL_TABLET | Freq: Every day | ORAL | 3 refills | Status: DC
Start: 2024-04-04 — End: 2024-07-05

## 2024-04-04 MED ORDER — ROSUVASTATIN 10 MG TABLET
10.0000 mg | ORAL_TABLET | Freq: Every evening | ORAL | 3 refills | Status: DC
Start: 2024-04-04 — End: 2024-07-05

## 2024-04-04 MED ORDER — TADALAFIL 20 MG TABLET
20.0000 mg | ORAL_TABLET | ORAL | 3 refills | Status: DC | PRN
Start: 2024-04-04 — End: 2024-04-04

## 2024-04-04 MED ORDER — LISINOPRIL 20 MG TABLET
20.0000 mg | ORAL_TABLET | Freq: Every day | ORAL | 3 refills | Status: DC
Start: 2024-04-04 — End: 2024-07-05

## 2024-04-04 MED ORDER — NURTEC ODT 75 MG DISINTEGRATING TABLET
75.0000 mg | ORAL_TABLET | Freq: Every day | ORAL | 3 refills | Status: DC | PRN
Start: 2024-04-04 — End: 2024-07-05

## 2024-04-04 MED ORDER — TERBINAFINE HCL 250 MG TABLET
250.0000 mg | ORAL_TABLET | Freq: Every day | ORAL | 1 refills | Status: AC
Start: 2024-04-04 — End: 2024-06-03

## 2024-04-04 MED ORDER — TADALAFIL 5 MG TABLET
5.0000 mg | ORAL_TABLET | ORAL | 3 refills | Status: DC | PRN
Start: 2024-04-04 — End: 2024-08-09

## 2024-04-04 MED ORDER — SUCRALFATE 1 GRAM TABLET
1.0000 g | ORAL_TABLET | Freq: Four times a day (QID) | ORAL | 5 refills | Status: DC
Start: 2024-04-04 — End: 2024-07-05

## 2024-04-04 MED ORDER — PANTOPRAZOLE 40 MG TABLET,DELAYED RELEASE
40.0000 mg | DELAYED_RELEASE_TABLET | Freq: Every day | ORAL | 1 refills | Status: DC
Start: 2024-04-04 — End: 2024-07-05

## 2024-04-04 MED ORDER — TRIAMCINOLONE ACETONIDE 0.1 % TOPICAL OINTMENT
TOPICAL_OINTMENT | Freq: Two times a day (BID) | CUTANEOUS | 0 refills | Status: DC
Start: 2024-04-04 — End: 2024-07-05

## 2024-04-04 NOTE — Progress Notes (Signed)
 FAMILY MEDICINE, [facility] FAMILY MEDICINE [facility]  [address]  [place] [address]  Operated by [facility]     Name: Ricky Grimes MRN:  [id number]   Date of Birth: 26-Mar-1951 Age: 73 y.o.   Date: 04/04/2024  Time: 20:36     Provider: Reeda Canner, DO    Assessment/Plan:  Problem List Items Addressed This Visit          Cardiovascular System    A-fib    Hyperlipidemia    Relevant Orders    LIPID PANEL       Neurologic    Migraines       Nephrology    Chronic kidney disease (CKD), active medical management without dialysis, stage 2 (mild)    Relevant Orders    CBC/DIFF    COMPREHENSIVE METABOLIC PANEL, NON-FASTING    THYROID  STIMULATING HORMONE (SENSITIVE TSH)    MAGNESIUM        Digestive    Irritable bowel syndrome with constipation       Endocrine    Vitamin B 12 deficiency    Relevant Orders    VITAMIN B12    Vitamin D  deficiency    Relevant Orders    VITAMIN D  25 TOTAL       Musculoskeletal    Hip pain, right       Other    Chronic prescription benzodiazepine use    Urinary frequency     Other Visit Diagnoses         Neoplasm of uncertain behavior of skin    -  Primary    Relevant Orders    17000 -  DESTRUCTION OF FIRST PREMALIGNANT LESION (AMB ONLY)    Referral to External Provider      Other specified disorders of nose and nasal sinuses        Relevant Medications    loratadine  (CLARITIN ) 10 mg Oral Tablet      Need for hepatitis C screening test        Relevant Orders    HEPATITIS C ANTIBODY SCREEN WITH REFLEX TO HCV PCR      Iron deficiency        Relevant Orders    IRON TRANSFERRIN AND TIBC      Hyperglycemia        Relevant Orders    HGA1C (HEMOGLOBIN A1C WITH EST AVG GLUCOSE)      Nocturia        Relevant Orders    URINALYSIS, MACROSCOPIC AND MICROSCOPIC W/CULTURE REFLEX           Add terbinafine . Recheck cmp in six weeks for full 3 month treatment.     Add Cialis . Has nocturia and urinary frequency taking flomax     Has insomnia; using klonopin  with good relief.  To continue. He is aware it is not recommended for use in he elderly. He gets benefit and feels like risks are outweighed by benefit.    BP not at goal but ok on meds at home. 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 Needs to f/u with derm. I did hit it with three cycles of liquid nitrogen today but will likely have to have one excised.           Reason for visit: Follow Up (Routine/Having pain right leg xray shows bone on bone hip/Medication not helping having frequent urination/He never had  EGD or he did not see foot doctor fungus toe)      History of Present Illness:  Ricky Grimes is a 73 y.o. 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,HTN, IBS with constipation  Patient Active Problem List   Diagnosis    Chronic anticoagulation    Hyperlipidemia    Accelerated hypertension    Irritable bowel syndrome with constipation    Vitamin D  deficiency    Vitamin B 12 deficiency    Urinary frequency    Allergic rhinitis    GERD with esophagitis    A-fib    Onychomycosis    It band syndrome, right    Prostate cancer screening    Chronic prescription benzodiazepine use    Sleeping difficulties    Migraines    Fever blister    Neck pain, chronic    Hematochezia    Sinusitis    Chronic kidney disease (CKD), active medical management without dialysis, stage 2 (mild)    Hip pain, right    Overweight    Actinic keratosis    Osteoarthritis of right hip    Pain in right hand    Trochanteric bursitis of right hip    Elevated troponin    Murmur        Historical Data    Past Medical History:  Past Medical History:   Diagnosis Date    Anticoagulant long-term use     Anxiety     Atrial fibrillation     Hypercholesterolemia     Hypertension     Migraine     Rectal bleeding 07/29/2022    S/P rotator cuff repair 07/12/2012    Wears glasses          Past Surgical History:  Past Surgical History:    Procedure Laterality Date    KNEE ARTHROSCOPY Left          Allergies:  Allergies   Allergen Reactions    Metoclopramide  Other Adverse Reaction (Add comment)     Shakes unable to be still     Medications:  aspirin  (ECOTRIN) 81 mg Oral Tablet, Delayed Release (E.C.), take 1 tablet every day for afib  azelastine  (ASTELIN ) 137 mcg (0.1 %) Nasal Aerosol, Spray, Administer 1 Spray into each nostril Twice daily Use in each nostril as directed  dilTIAZem  (CARDIZEM  CD) 240 mg Oral Capsule, Sust. Release 24 hr, Take 1 Capsule (240 mg total) by mouth Every morning Do not take if sbp<115 or heart rate<60  montelukast  (SINGULAIR ) 10 mg Oral Tablet, Take 1 Tablet (10 mg total) by mouth Once per day as needed  NALOXONE 4 MG/ACTUATION NASAL SPRAY - ED TO GO, 1 Spray by INTRANASAL route Every 2 minutes as needed for actual or suspected opioid overdose. Call 911 if used.  nitroGLYCERIN  (NITROSTAT ) 0.4 mg Sublingual Tablet, Sublingual, Place 1 Tablet (0.4 mg total) under the tongue Every 5 minutes as needed for Chest pain for 3 doses over 15 minutes  acyclovir  (ZOVIRAX ) 400 mg Oral Tablet, TAKE 1 TABLET by mouth THREE TIMES DAILY  cholecalciferol , vitamin D3, 100 mcg (4,000 unit) Oral Tablet, Take 1 Tablet (4,000 Units total) by mouth Once a day for 90 days (Patient not taking: Reported on 04/04/2024)  clonazePAM  (KLONOPIN ) 1 mg Oral Tablet, Take 1 Tablet (1 mg total) by mouth Twice daily Indications: Anxiety and sleep aid.  cloNIDine  HCL (CATAPRES ) 0.1 mg Oral Tablet, Take 1 Tablet (0.1 mg total) by mouth Twice daily Indications:  high blood pressure  lisinopriL  (PRINIVIL ) 20 mg Oral Tablet, TAKE 1 TABLET by mouth DAILY  loratadine  (CLARITIN ) 10 mg Oral Tablet, TAKE 1 TABLET by mouth EVERY DAY AS NEEDED  NURTEC ODT  75 mg Oral Tablet, Rapid Dissolve, dissolve 1 Tablet (75 mg total) under the tongue Once a day as needed for Migraines  oseltamivir  (TAMIFLU ) 75 mg Oral Capsule, Take 1 Capsule (75 mg total) by mouth Twice daily (Patient  not taking: Reported on 04/04/2024)  pantoprazole  (PROTONIX ) 40 mg Oral Tablet, Delayed Release (E.C.), TAKE 1 TABLET by mouth DAILY  promethazine -dextromethorphan  (PHENERGAN -DM) 6.25-15 mg/5 mL Oral Syrup, Take 5 mL by mouth Four times a day as needed for Cough (Patient not taking: Reported on 04/04/2024)  rivaroxaban  (XARELTO ) 20 mg Oral Tablet, Take 1 Tablet (20 mg total) by mouth Once a day for 90 days Indications: treatment to prevent blood clots in chronic atrial fibrillation  rosuvastatin  (CRESTOR ) 10 mg Oral Tablet, TAKE 1 TABLET by mouth DAILY IN THE EVENING  sucralfate  (CARAFATE ) 1 gram Oral Tablet, Take 1 Tablet (1 g total) by mouth Every 6 hours  tadalafil  (CIALIS ) 20 mg Oral Tablet, Take 1 Tablet (20 mg total) by mouth Every 72 hours as needed  tamsulosin  (FLOMAX ) 0.4 mg Oral Capsule, Take 1 Capsule (0.4 mg total) by mouth Every evening after dinner  triamcinolone  acetonide 0.1 % Ointment, Apply topically Twice daily    No facility-administered medications prior to visit.     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   Tobacco Use    Smoking status: Never    Smokeless tobacco: Never   Vaping Use    Vaping status: Never Used   Substance and Sexual Activity    Alcohol use: Yes     Alcohol/week: 12.0 standard drinks of alcohol     Types: 12 Cans of beer per week     Comment: per week    Drug use: Never    Sexual activity: Yes     Partners: Female     Birth control/protection: None     Social  Determinants of Health     Financial Resource Strain: Low Risk  (03/16/2023)    Financial Resource Strain     SDOH Financial: No   Transportation Needs: Low Risk  (03/16/2023)    Transportation Needs     SDOH Transportation: No   Social Connections: Low Risk  (03/16/2023)    Social Connections     SDOH Social Isolation: 5 or more times a week   Intimate Partner Violence: Low Risk  (07/30/2022)    Intimate Partner Violence     SDOH Domestic Violence: No   Housing Stability: Low Risk  (03/16/2023)    Housing Stability     SDOH Housing Situation: I have housing.     SDOH Housing Worry: No           Review of Systems:  Any  pertinent Review of Systems as addressed in the HPI above.    Physical Exam:  Vital Signs:  Vitals:    04/04/24 1110   BP: 136/80   Pulse: 65   Temp: 36.2 C (97.1 F)   TempSrc: Tympanic   SpO2: 99%   Weight: 80.3 kg (177 lb)   Height: 1.727 m (5\' 8" )   BMI: 26.91     Physical Exam  Vitals reviewed.   Constitutional:       General: He is not in acute distress.     Appearance: Normal appearance.   HENT:      Head: Normocephalic and atraumatic.      Right Ear: Tympanic membrane normal. There is no impacted cerumen.      Left Ear: Tympanic membrane normal. There is no impacted cerumen.      Nose: No congestion or rhinorrhea.      Mouth/Throat:      Pharynx: Posterior oropharyngeal erythema present. No oropharyngeal exudate.   Eyes:      General: No scleral icterus.        Right eye: No discharge.         Left eye: No discharge.   Neck:      Vascular: No carotid bruit.   Cardiovascular:      Rate and Rhythm: Normal rate and regular rhythm.      Pulses: Normal pulses.      Heart sounds: Normal heart sounds.   Pulmonary:      Effort: Pulmonary effort is normal.      Breath sounds: Normal breath sounds.      Comments: Decreased breath sounds bilateral bases  Abdominal:      Palpations: Abdomen is soft.      Tenderness: There is no abdominal tenderness.   Musculoskeletal:      Right lower leg: No edema.      Left  lower leg: No edema.   Lymphadenopathy:      Cervical: No cervical adenopathy.   Skin:     General: Skin is warm and dry.      Capillary Refill: Capillary refill takes less than 2 seconds.      Findings: No rash.      Comments: Left helix with scale in two areas. One has superficial ulceration   Neurological:      General: No focal deficit present.      Mental Status: He is alert and oriented to person, place, and time.                Reeda Canner, DO     Portions of this note may be dictated using voice recognition software or a dictation service. Variances in spelling and vocabulary are possible and unintentional. Not all errors are caught/corrected. Please notify the [facility] if any discrepancies are noted or if the meaning of any statement is not clear.

## 2024-04-04 NOTE — Procedures (Signed)
 FAMILY MEDICINE, [facility] FAMILY MEDICINE [facility]  [address]  [place] [address]  Operated by [facility]  Procedure Note    Name: Ricky Grimes MRN:  [id number]   Date: 04/04/2024 DOB:  1951/03/31 (73 y.o.)         17000 -  DESTRUCTION OF FIRST PREMALIGNANT LESION (AMB ONLY)    Performed by: Channah Godeaux, DO  Authorized by: Reeda Canner, DO    Time Out:     Immediately before the procedure, a time out was called:  Yes    Patient verified:  Yes    Procedure Verified:  Yes    Site Verified:  Yes    Other:  Left helix  Documentation:      Left helix with ak on outer rim.    Has previous ak that now has an ulceration that may be SCC. Needs to see derm    I did hit both areas with liquid nitrogen X 3 cycles without adverse effects to the pt      Reeda Canner, DO

## 2024-04-09 ENCOUNTER — Encounter (RURAL_HEALTH_CENTER): Payer: Self-pay | Admitting: Family Medicine

## 2024-05-09 ENCOUNTER — Other Ambulatory Visit (RURAL_HEALTH_CENTER): Payer: Self-pay | Admitting: Family Medicine

## 2024-05-09 DIAGNOSIS — J3489 Other specified disorders of nose and nasal sinuses: Secondary | ICD-10-CM

## 2024-05-24 ENCOUNTER — Ambulatory Visit: Attending: OTOLARYNGOLOGY

## 2024-05-24 ENCOUNTER — Other Ambulatory Visit: Payer: Self-pay

## 2024-05-24 DIAGNOSIS — J301 Allergic rhinitis due to pollen: Secondary | ICD-10-CM | POA: Insufficient documentation

## 2024-05-24 DIAGNOSIS — J3089 Other allergic rhinitis: Secondary | ICD-10-CM | POA: Insufficient documentation

## 2024-05-24 NOTE — Nursing Note (Signed)
 05/24/24 1100   ENT Immunotherapy Vial Preparation Flowsheet   Allergy  Test Date 02/16/23   Managing Physician [place]   Initials rs   A   Preparation Date 05/24/24   Expiration Date 09/06/24   Immunotherapy Status No escalation   Diagnosis Allergic Rhinitis due to pollen (J30.1)   [place] Grass   Initial Endpoint - [place] Grass 5   Dilution of Antigen 2   Volume of Antigen 0.70ml   Ragweed   Initial Endpoint - Ragweed 5   Dilution of Antigen 2   Volume of Antigen 0.62ml   Maple   Initial Endpoint - Maple 3   Dilution of Antigen Concentrate   Volume of Antigen 0.98ml   Pine   Initial Endpoint - Pine 3   Dilution of Antigen Concentrate   Volume of Antigen 0.74ml   A   Concentrates A 0.8   10% Glycerine Diluent  A 4.7   Total Volume A 5.5   B   Preparation Date 05/24/24   Expiration Date 09/06/24   Immunotherapy Status Concentrate/Maintenance   Diagnosis Allergy  to cockroach (Z91.038);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);Allergic Rhinitis due to dustmite (J30.89);Allergic Rhinitis due to mold (J30.89)   Sarocladium Strictum   Initial Endpoint - Sarocladium Strictum 3   Dilution of Antigen Concentrate   Volume of Antigen 0.39ml   Corn Smut   Initial Endpoint - Corn Smut 3   Dilution of Antigen Concentrate   Volume of Antigen 0.20ml   Mucor   Initial Endpoint - Mucor 3   Dilution of Antigen Concentrate   Volume of Antigen 0.9ml   Cockroach   Initial Endpoint - Cockroach 3   Dilution of Antigen Concentrate   Volume of Antigen 0.11ml   Cat   Initial Endpoint - Cat 3   Dilution of Antigen Concentrate   Volume of Antigen 0.20ml   D. Farinae Dust Mite   Initial Endpoint - D. Farinae Dust Mite 3   Dilution of Antigen Concentrate   Volume of Antigen 0.20ml   D. Pteronyssinus   Initial Endpoint - D. Pteronyssinus 3   Dilution of Antigen Concentrate   Volume of Antigen 0.67ml   B   Concentrates B 1.4   Saline Diluent  B 4.1   Total Volume B 5.5     Rita Southern, LPN  I have reviewed the above allergy  test results which  were positive, and will pursue treatment accordingly.  Oneil Alvine, DO

## 2024-07-05 ENCOUNTER — Ambulatory Visit: Attending: Family Medicine | Admitting: Family Medicine

## 2024-07-05 ENCOUNTER — Other Ambulatory Visit (RURAL_HEALTH_CENTER): Payer: Self-pay | Admitting: Family Medicine

## 2024-07-05 ENCOUNTER — Ambulatory Visit (RURAL_HEALTH_CENTER): Payer: Self-pay | Attending: Family Medicine | Admitting: Family Medicine

## 2024-07-05 ENCOUNTER — Encounter (RURAL_HEALTH_CENTER): Payer: Self-pay | Admitting: Family Medicine

## 2024-07-05 ENCOUNTER — Other Ambulatory Visit: Payer: Self-pay

## 2024-07-05 VITALS — BP 138/70 | HR 56 | Temp 97.2°F | Ht 68.0 in | Wt 179.0 lb

## 2024-07-05 DIAGNOSIS — Z79899 Other long term (current) drug therapy: Secondary | ICD-10-CM | POA: Insufficient documentation

## 2024-07-05 DIAGNOSIS — Z1159 Encounter for screening for other viral diseases: Secondary | ICD-10-CM | POA: Insufficient documentation

## 2024-07-05 DIAGNOSIS — E559 Vitamin D deficiency, unspecified: Secondary | ICD-10-CM | POA: Insufficient documentation

## 2024-07-05 DIAGNOSIS — K635 Polyp of colon: Secondary | ICD-10-CM | POA: Insufficient documentation

## 2024-07-05 DIAGNOSIS — I10 Essential (primary) hypertension: Secondary | ICD-10-CM | POA: Insufficient documentation

## 2024-07-05 DIAGNOSIS — E538 Deficiency of other specified B group vitamins: Secondary | ICD-10-CM | POA: Insufficient documentation

## 2024-07-05 DIAGNOSIS — R5383 Other fatigue: Secondary | ICD-10-CM | POA: Insufficient documentation

## 2024-07-05 DIAGNOSIS — L989 Disorder of the skin and subcutaneous tissue, unspecified: Secondary | ICD-10-CM | POA: Insufficient documentation

## 2024-07-05 DIAGNOSIS — G4700 Insomnia, unspecified: Secondary | ICD-10-CM | POA: Insufficient documentation

## 2024-07-05 DIAGNOSIS — J020 Streptococcal pharyngitis: Secondary | ICD-10-CM | POA: Insufficient documentation

## 2024-07-05 DIAGNOSIS — I480 Paroxysmal atrial fibrillation: Secondary | ICD-10-CM | POA: Insufficient documentation

## 2024-07-05 DIAGNOSIS — K219 Gastro-esophageal reflux disease without esophagitis: Secondary | ICD-10-CM | POA: Insufficient documentation

## 2024-07-05 DIAGNOSIS — D72819 Decreased white blood cell count, unspecified: Secondary | ICD-10-CM | POA: Insufficient documentation

## 2024-07-05 DIAGNOSIS — B351 Tinea unguium: Secondary | ICD-10-CM | POA: Insufficient documentation

## 2024-07-05 DIAGNOSIS — N529 Male erectile dysfunction, unspecified: Secondary | ICD-10-CM | POA: Insufficient documentation

## 2024-07-05 DIAGNOSIS — Z7901 Long term (current) use of anticoagulants: Secondary | ICD-10-CM | POA: Insufficient documentation

## 2024-07-05 LAB — THYROID STIMULATING HORMONE (SENSITIVE TSH): TSH: 1.473 u[IU]/mL (ref 0.450–5.330)

## 2024-07-05 LAB — CBC WITH DIFF
BASOPHIL #: 0 x10ˆ3/uL (ref 0.00–0.10)
BASOPHIL %: 1 % (ref 0–1)
EOSINOPHIL #: 0.1 x10ˆ3/uL (ref 0.00–0.60)
EOSINOPHIL %: 2 % (ref 1–8)
HCT: 39.5 % (ref 36.7–47.1)
HGB: 13.5 g/dL (ref 12.5–16.3)
LYMPHOCYTE #: 0.9 x10ˆ3/uL — ABNORMAL LOW (ref 1.00–3.00)
LYMPHOCYTE %: 26 % (ref 15–43)
MCH: 32.4 pg (ref 23.8–33.4)
MCHC: 34.1 g/dL (ref 32.5–36.3)
MCV: 94.9 fL (ref 73.0–96.2)
MONOCYTE #: 0.2 x10ˆ3/uL — ABNORMAL LOW (ref 0.30–1.10)
MONOCYTE %: 6 % (ref 6–14)
MPV: 8.8 fL (ref 7.4–11.4)
NEUTROPHIL #: 2.1 x10ˆ3/uL (ref 1.85–7.84)
NEUTROPHIL %: 65 % (ref 44–74)
PLATELETS: 180 x10ˆ3/uL (ref 140–440)
RBC: 4.17 x10ˆ6/uL (ref 4.06–5.63)
RDW: 13.9 % (ref 12.1–16.2)
WBC: 3.2 x10ˆ3/uL — ABNORMAL LOW (ref 3.6–10.2)

## 2024-07-05 LAB — MAGNESIUM: MAGNESIUM: 2 mg/dL (ref 1.9–2.7)

## 2024-07-05 LAB — COMPREHENSIVE METABOLIC PANEL, NON-FASTING
ALBUMIN/GLOBULIN RATIO: 1.9 — ABNORMAL HIGH (ref 0.8–1.4)
ALBUMIN: 4.2 g/dL (ref 3.5–5.7)
ALKALINE PHOSPHATASE: 51 U/L (ref 34–104)
ALT (SGPT): 14 U/L (ref 7–52)
ANION GAP: 5 mmol/L (ref 4–13)
AST (SGOT): 19 U/L (ref 13–39)
BILIRUBIN TOTAL: 0.5 mg/dL (ref 0.3–1.0)
BUN/CREA RATIO: 16 (ref 6–22)
BUN: 18 mg/dL (ref 7–25)
CALCIUM, CORRECTED: 9.2 mg/dL (ref 8.9–10.8)
CALCIUM: 9.4 mg/dL (ref 8.6–10.3)
CHLORIDE: 111 mmol/L — ABNORMAL HIGH (ref 98–107)
CO2 TOTAL: 28 mmol/L (ref 21–31)
CREATININE: 1.1 mg/dL (ref 0.60–1.30)
ESTIMATED GFR: 70 mL/min/1.73mˆ2 (ref >59)
GLOBULIN: 2.2 (ref 2.0–3.5)
GLUCOSE: 110 mg/dL — ABNORMAL HIGH (ref 74–109)
OSMOLALITY, CALCULATED: 289 mosm/kg (ref 270–290)
POTASSIUM: 4 mmol/L (ref 3.5–5.1)
PROTEIN TOTAL: 6.4 g/dL (ref 6.4–8.9)
SODIUM: 144 mmol/L (ref 136–145)

## 2024-07-05 LAB — POCT RAPID STREP A: RAPID STREP A (POCT): POSITIVE

## 2024-07-05 LAB — VITAMIN D 25 TOTAL: VITAMIN D 25, TOTAL: 28.84 ng/mL — ABNORMAL LOW (ref 30.00–100.00)

## 2024-07-05 LAB — VITAMIN B12: VITAMIN B 12: 325 pg/mL (ref 180–914)

## 2024-07-05 MED ORDER — CLONAZEPAM 1 MG TABLET
1.0000 mg | ORAL_TABLET | Freq: Two times a day (BID) | ORAL | 3 refills | Status: AC
Start: 2024-07-05 — End: 2025-07-05

## 2024-07-05 MED ORDER — TAMSULOSIN 0.4 MG CAPSULE
0.4000 mg | ORAL_CAPSULE | Freq: Every evening | ORAL | 3 refills | Status: AC
Start: 2024-07-05 — End: ?

## 2024-07-05 MED ORDER — TRIAMCINOLONE ACETONIDE 0.1 % TOPICAL OINTMENT
TOPICAL_OINTMENT | Freq: Two times a day (BID) | CUTANEOUS | 0 refills | Status: DC
Start: 2024-07-05 — End: 2024-08-09

## 2024-07-05 MED ORDER — LISINOPRIL 20 MG TABLET
20.0000 mg | ORAL_TABLET | Freq: Every day | ORAL | 3 refills | Status: DC
Start: 2024-07-05 — End: 2024-08-15

## 2024-07-05 MED ORDER — PANTOPRAZOLE 40 MG TABLET,DELAYED RELEASE
40.0000 mg | DELAYED_RELEASE_TABLET | Freq: Every day | ORAL | 1 refills | Status: AC
Start: 2024-07-05 — End: ?

## 2024-07-05 MED ORDER — RIVAROXABAN 20 MG TABLET
20.0000 mg | ORAL_TABLET | Freq: Every day | ORAL | 3 refills | Status: DC
Start: 2024-07-05 — End: 2024-09-26

## 2024-07-05 MED ORDER — DILTIAZEM CD 240 MG CAPSULE,EXTENDED RELEASE 24 HR
240.0000 mg | ORAL_CAPSULE | Freq: Every morning | ORAL | 3 refills | Status: DC
Start: 2024-07-05 — End: 2024-08-10

## 2024-07-05 MED ORDER — LINACLOTIDE 145 MCG CAPSULE
145.0000 ug | ORAL_CAPSULE | Freq: Every morning | ORAL | 3 refills | Status: DC
Start: 2024-07-05 — End: 2024-10-04

## 2024-07-05 MED ORDER — SOLIFENACIN 10 MG TABLET
10.0000 mg | ORAL_TABLET | Freq: Every day | ORAL | 3 refills | Status: AC
Start: 2024-07-05 — End: ?

## 2024-07-05 MED ORDER — MONTELUKAST 10 MG TABLET
10.0000 mg | ORAL_TABLET | Freq: Every day | ORAL | 3 refills | Status: AC | PRN
Start: 2024-07-05 — End: ?

## 2024-07-05 MED ORDER — ROSUVASTATIN 10 MG TABLET
10.0000 mg | ORAL_TABLET | Freq: Every evening | ORAL | 3 refills | Status: AC
Start: 2024-07-05 — End: ?

## 2024-07-05 MED ORDER — ACYCLOVIR 400 MG TABLET
400.0000 mg | ORAL_TABLET | Freq: Three times a day (TID) | ORAL | 5 refills | Status: DC
Start: 2024-07-05 — End: 2024-08-09

## 2024-07-05 MED ORDER — AZELASTINE 137 MCG (0.1 %) NASAL SPRAY
1.0000 | Freq: Two times a day (BID) | NASAL | 11 refills | Status: DC
Start: 2024-07-05 — End: 2024-08-09

## 2024-07-05 MED ORDER — NURTEC ODT 75 MG DISINTEGRATING TABLET
75.0000 mg | ORAL_TABLET | Freq: Every day | ORAL | 3 refills | Status: AC | PRN
Start: 2024-07-05 — End: ?

## 2024-07-05 MED ORDER — AMOXICILLIN 875 MG-POTASSIUM CLAVULANATE 125 MG TABLET
1.0000 | ORAL_TABLET | Freq: Two times a day (BID) | ORAL | 0 refills | Status: DC
Start: 2024-07-05 — End: 2024-08-09

## 2024-07-05 MED ORDER — LORATADINE 10 MG TABLET
10.0000 mg | ORAL_TABLET | Freq: Every day | ORAL | 0 refills | Status: DC
Start: 2024-07-05 — End: 2024-08-09

## 2024-07-05 MED ORDER — SILDENAFIL 100 MG TABLET
100.0000 mg | ORAL_TABLET | ORAL | 1 refills | Status: AC | PRN
Start: 2024-07-05 — End: ?

## 2024-07-05 MED ORDER — SUCRALFATE 1 GRAM TABLET
1.0000 g | ORAL_TABLET | Freq: Four times a day (QID) | ORAL | 5 refills | Status: AC
Start: 2024-07-05 — End: ?

## 2024-07-05 NOTE — Progress Notes (Signed)
 FAMILY MEDICINE, [facility] FAMILY MEDICINE [facility]  [address]  [place] [address]  Operated by [facility]     Name: Ricky Grimes MRN:  [id number]   Date of Birth: 09-04-1951 Age: 73 y.o.   Date: 07/05/2024  Time: 11:56     Provider: Jhonny Romano, DO    Assessment/Plan:  Problem List Items Addressed This Visit          Cardiovascular System    A-fib       Endocrine    Vitamin D  deficiency    Relevant Orders    VITAMIN D  25 TOTAL       Dermatology    Onychomycosis     Other Visit Diagnoses         Leukopenia, unspecified type    -  Primary    Relevant Orders    CBC/DIFF      Need for hepatitis C screening test        Relevant Orders    HEPATITIS C ANTIBODY SCREEN WITH REFLEX TO HCV PCR      Fatigue, unspecified type        Relevant Orders    TESTOSTERONE , TOTAL, BIOAVAILABLE, AND FREE WITH SHBG, SERUM    COMPREHENSIVE METABOLIC PANEL, NON-FASTING    THYROID  STIMULATING HORMONE (SENSITIVE TSH)      Erectile dysfunction, unspecified erectile dysfunction type          Polyp of colon, unspecified part of colon, unspecified type          Gastroesophageal reflux disease, unspecified whether esophagitis present        Relevant Orders    Referral to External Provider    Referral to GENERAL SURGERY - [place] - DUREMDES, MULLINS, HOPKINS      B12 deficiency        Relevant Orders    VITAMIN B12      Hypomagnesemia        Relevant Orders    MAGNESIUM       Strep pharyngitis        Relevant Orders    POCT Rapid Strep (Completed)           Had shingrix from [place]    Insomnia; using klonopin  with good relief. To continue. He is aware it is not recommended for use in he elderly. He gets benefit and feels like risks are outweighed by benefit.    BP a little elevated; for now, To continue cardizem  and prinivil .  Optimal blood pressure control is <130/<80    Has paroxysmal atrial fibrillation; continue Xarelto . Follows with Dr. Sheralyn and has an appt later this month    Lesion of left  ear excised by derm. To continue follow up    Strep present. Sent in Augmentin . Contagious until 24 hours after starts antibiotic    Repeat labs today    GERD not well controlled despite Carafate ; will refer for EGD    Reason for visit: Follow Up (routine)      History of Present Illness:  Ricky Grimes is a 73 y.o. 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    Since his last visit, he has had excision of left ear SCC. Now healing ok.    Has had sinus drainage and cough. Using allergy  meds without relief  Patient Active Problem List   Diagnosis    Chronic anticoagulation    Hyperlipidemia  Accelerated hypertension    Irritable bowel syndrome with constipation    Vitamin D  deficiency    Vitamin B 12 deficiency    Urinary frequency    Allergic rhinitis    GERD with esophagitis    A-fib    Onychomycosis    It band syndrome, right    Prostate cancer screening    Chronic prescription benzodiazepine use    Sleeping difficulties    Migraines    Fever blister    Neck pain, chronic    Hematochezia    Sinusitis    Chronic kidney disease (CKD), active medical management without dialysis, stage 2 (mild)    Hip pain, right    Overweight    Actinic keratosis    Osteoarthritis of right hip    Pain in right hand    Trochanteric bursitis of right hip    Elevated troponin    Murmur        Historical Data    Past Medical History:  Past Medical History:   Diagnosis Date    Anticoagulant long-term use     Anxiety     Atrial fibrillation     Hypercholesterolemia     Hypertension     Migraine     Rectal bleeding 07/29/2022    S/P rotator cuff repair 07/12/2012    Wears glasses          Past Surgical History:  Past Surgical History:   Procedure Laterality Date    KNEE ARTHROSCOPY Left          Allergies:  Allergies   Allergen Reactions    Metoclopramide  Other Adverse Reaction (Add comment)     Shakes unable to be still     Medications:  aspirin   (ECOTRIN) 81 mg Oral Tablet, Delayed Release (E.C.), take 1 tablet every day for afib  NALOXONE 4 MG/ACTUATION NASAL SPRAY - ED TO GO, 1 Spray by INTRANASAL route Every 2 minutes as needed for actual or suspected opioid overdose. Call 911 if used.  nitroGLYCERIN  (NITROSTAT ) 0.4 mg Sublingual Tablet, Sublingual, Place 1 Tablet (0.4 mg total) under the tongue Every 5 minutes as needed for Chest pain for 3 doses over 15 minutes  tadalafil  (CIALIS ) 5 mg Oral Tablet, Take 1 Tablet (5 mg total) by mouth Every 24 hours as needed  acyclovir  (ZOVIRAX ) 400 mg Oral Tablet, Take 1 Tablet (400 mg total) by mouth Three times a day  azelastine  (ASTELIN ) 137 mcg (0.1 %) Nasal Aerosol, Spray, Administer 1 Spray into each nostril Twice daily Use in each nostril as directed  clonazePAM  (KLONOPIN ) 1 mg Oral Tablet, Take 1 Tablet (1 mg total) by mouth Twice daily Indications: Anxiety and sleep aid.  dilTIAZem  (CARDIZEM  CD) 240 mg Oral Capsule, Sust. Release 24 hr, Take 1 Capsule (240 mg total) by mouth Every morning Do not take if sbp<115 or heart rate<60  lisinopriL  (PRINIVIL ) 20 mg Oral Tablet, Take 1 Tablet (20 mg total) by mouth Daily  loratadine  (CLARITIN ) 10 mg Oral Tablet, TAKE 1 TABLET by mouth ONCE DAILY AS NEEDED  montelukast  (SINGULAIR ) 10 mg Oral Tablet, Take 1 Tablet (10 mg total) by mouth Once per day as needed  pantoprazole  (PROTONIX ) 40 mg Oral Tablet, Delayed Release (E.C.), Take 1 Tablet (40 mg total) by mouth Daily  rimegepant (NURTEC ODT ) 75 mg Oral Tablet, Rapid Dissolve, Place 1 Tablet (75 mg total) under the tongue Once per day as needed for Migraine  rivaroxaban  (XARELTO ) 20 mg Oral Tablet, Take 1 Tablet (20  mg total) by mouth Daily Indications: treatment to prevent blood clots in chronic atrial fibrillation  rosuvastatin  (CRESTOR ) 10 mg Oral Tablet, Take 1 Tablet (10 mg total) by mouth Every evening  sucralfate  (CARAFATE ) 1 gram Oral Tablet, Take 1 Tablet (1 g total) by mouth Every 6 hours  tamsulosin  (FLOMAX )  0.4 mg Oral Capsule, Take 1 Capsule (0.4 mg total) by mouth Every evening after dinner  triamcinolone  acetonide 0.1 % Ointment, Apply topically Twice daily    No facility-administered medications prior to visit.     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   Tobacco Use    Smoking status: Never    Smokeless tobacco: Never   Vaping Use    Vaping status: Never Used   Substance and Sexual Activity    Alcohol use: Yes     Alcohol/week: 12.0 standard drinks of alcohol     Types: 12 Cans of beer per week     Comment: per week    Drug use: Never    Sexual activity: Yes     Partners: Female     Birth control/protection: None     Social Determinants of Health     Financial Resource Strain: Low Risk  (03/16/2023)    Financial Resource Strain     SDOH Financial: No   Transportation Needs: Low Risk  (03/16/2023)    Transportation Needs     SDOH Transportation: No   Social Connections: Low Risk  (03/16/2023)    Social Connections     SDOH Social Isolation: 5 or more times a week   Intimate Partner Violence: Low Risk  (07/30/2022)    Intimate Partner Violence     SDOH Domestic Violence: No   Housing Stability: Low Risk  (03/16/2023)    Housing Stability     SDOH Housing Situation: I have housing.     SDOH Housing Worry: No           Review of Systems:  Any pertinent Review of Systems as addressed in the HPI above.    Physical Exam:  Vital Signs:  Vitals:    07/05/24 1105   BP: 138/70   Pulse: 56   Temp: 36.2 C (97.2  F)   TempSrc: Tympanic   SpO2: 97%   Weight: 81.2 kg (179 lb)   Height: 1.727 m (5' 8)   BMI: 27.22     Physical Exam  Vitals reviewed.   Constitutional:       General: He is not in acute distress.     Appearance: Normal appearance.   HENT:      Head: Normocephalic and atraumatic.      Right Ear: Tympanic membrane normal. There is no impacted cerumen.      Left Ear: Tympanic membrane normal. There is no impacted cerumen.      Nose: No congestion or rhinorrhea.      Mouth/Throat:  Pharynx: Posterior oropharyngeal erythema present. No oropharyngeal exudate.      Comments: Rapid strep is positive  Eyes:      General: No scleral icterus.        Right eye: No discharge.         Left eye: No discharge.   Neck:      Vascular: No carotid bruit.   Cardiovascular:      Rate and Rhythm: Normal rate and regular rhythm.      Pulses: Normal pulses.      Heart sounds: Normal heart sounds.   Pulmonary:      Effort: Pulmonary effort is normal.      Breath sounds: Normal breath sounds.      Comments: Decreased breath sounds bilateral bases  Abdominal:      Palpations: Abdomen is soft.      Tenderness: There is no abdominal tenderness.   Musculoskeletal:      Right lower leg: No edema.      Left lower leg: No edema.   Lymphadenopathy:      Cervical: No cervical adenopathy.   Skin:     General: Skin is warm and dry.      Capillary Refill: Capillary refill takes less than 2 seconds.      Findings: No rash.      Comments: Lesion left helix of ear healing. Has vaseline cover   Neurological:      General: No focal deficit present.      Mental Status: He is alert and oriented to person, place, and time.                Jhonny Romano, DO     Portions of this note may be dictated using voice recognition software or a dictation service. Variances in spelling and vocabulary are possible and unintentional. Not all errors are caught/corrected. Please notify the [facility] if any discrepancies are noted or if the meaning of any statement is not  clear.

## 2024-07-06 ENCOUNTER — Other Ambulatory Visit (RURAL_HEALTH_CENTER): Payer: Self-pay | Admitting: Family Medicine

## 2024-07-06 DIAGNOSIS — B351 Tinea unguium: Secondary | ICD-10-CM

## 2024-07-07 ENCOUNTER — Ambulatory Visit (RURAL_HEALTH_CENTER): Payer: Self-pay | Admitting: Family Medicine

## 2024-07-07 LAB — HEPATITIS C ANTIBODY SCREEN WITH REFLEX TO HCV PCR: HCV ANTIBODY QUALITATIVE: NEGATIVE

## 2024-07-08 LAB — TESTOSTERONE, TOTAL, BIOAVAILABLE, AND FREE WITH SHBG, SERUM
ALBUMIN: 4 g/dL (ref 3.6–5.1)
SEX HORMONE BINDING GLOB.: 20 nmol/L — ABNORMAL LOW (ref 22–77)
TESTOSTERONE, BIOAVAILABLE: 105.1 ng/dL (ref 15.0–150.0)
TESTOSTERONE, FREE: 57.2 pg/mL (ref 6.0–73.0)
TESTOSTERONE,TOTAL,LC/MS/MS: 296 ng/dL (ref 250–1100)

## 2024-07-27 ENCOUNTER — Other Ambulatory Visit (INDEPENDENT_AMBULATORY_CARE_PROVIDER_SITE_OTHER): Payer: Self-pay | Admitting: Surgery

## 2024-07-27 ENCOUNTER — Ambulatory Visit (INDEPENDENT_AMBULATORY_CARE_PROVIDER_SITE_OTHER): Payer: Self-pay | Admitting: Surgery

## 2024-07-27 ENCOUNTER — Encounter (INDEPENDENT_AMBULATORY_CARE_PROVIDER_SITE_OTHER): Payer: Self-pay | Admitting: Surgery

## 2024-07-27 ENCOUNTER — Other Ambulatory Visit: Payer: Self-pay

## 2024-07-27 DIAGNOSIS — K5900 Constipation, unspecified: Secondary | ICD-10-CM

## 2024-07-27 DIAGNOSIS — K219 Gastro-esophageal reflux disease without esophagitis: Secondary | ICD-10-CM

## 2024-07-27 MED ORDER — DICYCLOMINE 10 MG CAPSULE
10.0000 mg | ORAL_CAPSULE | Freq: Three times a day (TID) | ORAL | 3 refills | Status: AC | PRN
Start: 2024-07-27 — End: ?

## 2024-07-27 NOTE — Progress Notes (Signed)
 GENERAL SURGERY, [facility] GENERAL SURGERY  [address]  [place] [address]    History and Physical    Name: Ricky REH MRN:  [id number]   Date: 07/27/2024 DOB:  October 30, 1951 (72 y.o.)              Reason for Visit: EGD    History of Present Illness  Mr. Sells presents today for evaluation of reflux.  The patient states that he is taking Protonix  and Carafate  which seems to control his reflux quite well.  The 1 thing he does notices is that he is constipated and he takes Linzess .  The colonoscopy was in 2022 which was unremarkable.  The patient is able to eat without any nausea or vomiting nor does he have any heartburn or dyspepsia.    Negative diabetes      Review of the result(s) of each unique test:  Patient underwent diagnostic testing ( none) prior to this dates visit.  I have personally reviewed the results and that serves as a component of the medical decision making for this encounter       Review of prior external note(s) from each unique source:  Patients referral to this office including a recent assessment by the referring provider.  This was reviewed by me for this unique office visit for the indication and intent of the referral as well as any pertinent medical or surgical history relevant to the patients independent evaluation by me today.      Patient History  Past Medical History:   Diagnosis Date    Anticoagulant long-term use     Anxiety     Atrial fibrillation     Hypercholesterolemia     Hypertension     Migraine     Rectal bleeding 07/29/2022    S/P rotator cuff repair 07/12/2012    Wears glasses          Past Surgical History:   Procedure Laterality Date    KNEE ARTHROSCOPY Left          Current Outpatient Medications   Medication Sig    acyclovir  (ZOVIRAX ) 400 mg Oral Tablet Take 1 Tablet (400 mg total) by mouth Three times a day    amoxicillin -pot clavulanate (AUGMENTIN ) 875-125 mg Oral Tablet Take 1 Tablet by mouth Twice daily for 10 days    aspirin  (ECOTRIN) 81  mg Oral Tablet, Delayed Release (E.C.) take 1 tablet every day for afib    azelastine  (ASTELIN ) 137 mcg (0.1 %) Nasal Spray, Non-Aerosol Administer 1 Spray into each nostril Twice daily Use in each nostril as directed    clonazePAM  (KLONOPIN ) 1 mg Oral Tablet Take 1 Tablet (1 mg total) by mouth Twice daily Indications: Anxiety and sleep aid.    dilTIAZem  (CARDIZEM  CD) 240 mg Oral Capsule, Sust. Release 24 hr Take 1 Capsule (240 mg total) by mouth Every morning Do not take if sbp<115 or heart rate<60    linaCLOtide  (LINZESS ) 145 mcg Oral Capsule Take 1 Capsule (145 mcg total) by mouth Every morning Indications: irritable bowel syndrome with constipation    lisinopriL  (PRINIVIL ) 20 mg Oral Tablet Take 1 Tablet (20 mg total) by mouth Daily    loratadine  (CLARITIN ) 10 mg Oral Tablet Take 1 Tablet (10 mg total) by mouth Daily    montelukast  (SINGULAIR ) 10 mg Oral Tablet Take 1 Tablet (10 mg total) by mouth Once per day as needed    NALOXONE 4 MG/ACTUATION NASAL SPRAY - ED TO GO 1  Spray by INTRANASAL route Every 2 minutes as needed for actual or suspected opioid overdose. Call 911 if used.    nitroGLYCERIN  (NITROSTAT ) 0.4 mg Sublingual Tablet, Sublingual Place 1 Tablet (0.4 mg total) under the tongue Every 5 minutes as needed for Chest pain for 3 doses over 15 minutes    pantoprazole  (PROTONIX ) 40 mg Oral Tablet, Delayed Release (E.C.) Take 1 Tablet (40 mg total) by mouth Daily    rimegepant (NURTEC ODT ) 75 mg Oral Tablet, Rapid Dissolve Place 1 Tablet (75 mg total) under the tongue Once per day as needed for Migraine    rivaroxaban  (XARELTO ) 20 mg Oral Tablet Take 1 Tablet (20 mg total) by mouth Daily Indications: treatment to prevent blood clots in chronic atrial fibrillation    rosuvastatin  (CRESTOR ) 10 mg Oral Tablet Take 1 Tablet (10 mg total) by mouth Every evening    Sildenafil  (VIAGRA ) 100 mg Oral Tablet Take 1 Tablet (100 mg total) by mouth Every 24 hours as needed Indications: the inability to have an erection     solifenacin  (VESICARE ) 10 mg Oral Tablet Take 1 Tablet (10 mg total) by mouth Daily    sucralfate  (CARAFATE ) 1 gram Oral Tablet Take 1 Tablet (1 g total) by mouth Every 6 hours    tadalafil  (CIALIS ) 5 mg Oral Tablet Take 1 Tablet (5 mg total) by mouth Every 24 hours as needed    tamsulosin  (FLOMAX ) 0.4 mg Oral Capsule Take 1 Capsule (0.4 mg total) by mouth Every evening after dinner    triamcinolone  acetonide 0.1 % Ointment Apply topically Twice daily     Allergies[1]  Family Medical History:       Problem Relation (Age of Onset)    Atrial fibrillation Father    Cancer Father    No Known Problems Mother, Sister, Brother, Half-Sister, Half-Brother, Maternal Aunt, Maternal Uncle, Paternal Aunt, Paternal Uncle, Maternal Grandmother, Maternal Grandfather, Paternal Grandmother, Paternal Grandfather, Daughter, Son            Social History[2]         Physical Examination:  Vitals:    07/27/24 1358   BP: 139/70   Pulse: 84   Resp: 18   Temp: 36.7 C (98.1 F)   SpO2: 96%   Weight: 81.2 kg (179 lb)   Height: 1.727 m (5' 8)   BMI: 27.22        General: appropriate for age. in no acute distress.    Vital signs are present above and have been reviewed by me     HEENT: Atraumatic, Normocephalic. PERRLA. EOMI. Nose clear. Throat clear    Lungs: Nonlabored breathing with symmetric expansion. Clear to auscultation bilaterally    Heart:Regular wth respect to rate and rythmn.    Abdomen:Soft. Nontender. Nondistended and benign    Extremities: Grossly normal. No major deformities     Neuro:  Grossly normal motor and sensory function    Psychiatric: Alert and oriented to person, place, and time. affect appropriate      Assessment and Plan  The patient will continue his Linzess  as he is doing already  The patient will continue Protonix  and Carafate   I will give the patient a prescription for Bentyl   The patient is given instructions to hydrate well at least 6-8 glasses of water/what equivalents per day  Add fiber to his  diet  Return to clinic p.r.n.      Follow Up:  Return if symptoms worsen or fail to improve.      ICD-10-CM  1. Gastroesophageal reflux disease, unspecified whether esophagitis present  K21.9           Avalie Oconnor B Kammy Klett, MD ,MBA,FACS    I appreciate the opportunity to be involved in the care of your patients.  If you have any questions or concerns regarding this encounter, please do not hesitate to contact me at your convenience.      This note may have been partially generated using MModal Fluency Direct system, and there may be some incorrect words, spellings, and punctuation that were not noted in checking the note before saving, though effort was made to avoid such errors.               [1]   Allergies  Allergen Reactions    Metoclopramide  Other Adverse Reaction (Add comment)     Shakes unable to be still   [2]   Social History  Tobacco Use    Smoking status: Never    Smokeless tobacco: Never   Vaping Use    Vaping status: Never Used   Substance Use Topics    Alcohol use: Yes     Alcohol/week: 12.0 standard drinks of alcohol     Types: 12 Cans of beer per week     Comment: per week    Drug use: Never

## 2024-08-07 ENCOUNTER — Encounter (RURAL_HEALTH_CENTER): Payer: Self-pay | Admitting: Family Medicine

## 2024-08-07 ENCOUNTER — Ambulatory Visit (RURAL_HEALTH_CENTER): Payer: Self-pay | Attending: Family Medicine | Admitting: Family Medicine

## 2024-08-07 ENCOUNTER — Other Ambulatory Visit: Payer: Self-pay

## 2024-08-07 VITALS — BP 126/70 | HR 60 | Temp 97.5°F | Ht 68.0 in | Wt 172.0 lb

## 2024-08-07 DIAGNOSIS — B351 Tinea unguium: Secondary | ICD-10-CM | POA: Insufficient documentation

## 2024-08-07 DIAGNOSIS — N489 Disorder of penis, unspecified: Secondary | ICD-10-CM | POA: Insufficient documentation

## 2024-08-07 DIAGNOSIS — L989 Disorder of the skin and subcutaneous tissue, unspecified: Secondary | ICD-10-CM | POA: Insufficient documentation

## 2024-08-07 DIAGNOSIS — S30810A Abrasion of lower back and pelvis, initial encounter: Secondary | ICD-10-CM | POA: Insufficient documentation

## 2024-08-07 MED ORDER — MUPIROCIN 2 % TOPICAL OINTMENT
TOPICAL_OINTMENT | Freq: Three times a day (TID) | CUTANEOUS | 3 refills | Status: DC
Start: 2024-08-07 — End: 2024-08-09

## 2024-08-07 MED ORDER — RESINOL 2 %-55 % TOPICAL OINTMENT
TOPICAL_OINTMENT | Freq: Four times a day (QID) | CUTANEOUS | 0 refills | Status: DC
Start: 2024-08-07 — End: 2024-08-09

## 2024-08-07 MED ORDER — TERBINAFINE HCL 250 MG TABLET
250.0000 mg | ORAL_TABLET | Freq: Every day | ORAL | 0 refills | Status: DC
Start: 2024-08-07 — End: 2024-10-04

## 2024-08-07 NOTE — Progress Notes (Addendum)
 FAMILY MEDICINE, [facility] FAMILY MEDICINE [facility]  [address]  [place] [address]  Operated by [facility]  Progress Note    Name: Ricky Grimes MRN:  [id number]   Date: 08/07/2024 DOB:  March 04, 1951 (72 y.o.)             Assessment & Plan    Assessment & Plan             ICD-10-CM    1. Penile lesion  N48.9 Referral to External Provider      2. Onychomycosis  B35.1 COMPREHENSIVE METABOLIC PNL, FASTING      3. Skin lesion of left leg  L98.9       4. Buttock abrasion, initial encounter  S30.810A            Orders Placed This Encounter    COMPREHENSIVE METABOLIC PNL, FASTING    Referral to External Provider    mupirocin  (BACTROBAN ) 2 % Ointment    terbinafine  HCL (LAMISIL ) 250 mg Oral Tablet    Resorcinol-Petro-Cala-Zinc-Lan (RESINOL) 2-55 % Ointment     Refer urology for penile lesion.  Return here for left leg excisional bx.  Has abrasions of buttocks. To use bactroban  and resinol. F/u if fail to resolve or worsen  Use lamisil  orally for six weeks. Must check cmp at six weeks to avoid hurting liver. I did make sure he understands this. Use alcohol infrequently. Limit to 2 drinks at most per day    Chief Complaint: Lumps (Left inner thigh lesion and buttocks sore place)    Subjective:  History of Present Illness      Subjective: Patient is a 73 year old man with PMH of anxiety, a fib, HTN, hypercholesterolemia, migraine presenting for follow-up care of the following complaints:  -toenail fungus: He has fungus on his left big toe for 3-6 months; the cream he is on is not helping and it's spreading to the other toes on that foot.  -Fatigue: He also complains of fatigue; he had blood drawn one or two months ago and he has not received results.   -Lesion: He has a spot on his left inner thigh that he first noticed one month ago; he says it has gotten bigger; it is clear-skin colored'; denies pain, bleeding, discharge, or ulceration at the site.   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 He has had 1 male sexual partner for the past 5 years.   -Sore spot on the buttocks: He has a spot on his buttock near his tailbone that he believes he got from doing sit-ups on the floor. He is worried it is not infected. Denies blood in underwear or on the toilet paper.   -urinary frequency: He has complaints of peeing a lot for the past month. He thinks it is because he needs clarification on which medicines he is supposed to be taking and when. He said he has not started new medicine recently. Denies dysuria, hematuria, increased thirst, dribbling after urination, or stopping and starting; no recent head trauma.   He admits to some shortness of breath with activity as well as some periods of lightheadedness.     Objective:  BP 126/70 (Site: Left Arm, Patient Position: Sitting, Cuff Size: Adult)   Pulse 60   Temp 36.4 C (97.5 F) (Tympanic)   Ht 1.727 m (  5' 8)   Wt 78 kg (172 lb)   SpO2 97%   BMI 26.15 kg/m       Physical Exam   Physical Exam    Physical exam:  Gen: pleasant, appears stated age  CV: RRR, 3/6 diastolic murmur heard best a LLSB  Resp: Lungs CTA bilaterally, no wheezes or crackles  Abd: bowel sounds x4 quadrants, belly soft, no tenderness including in suprapubic area  Skin: There is a 1 mm lesion on the L inner thigh, no surround erythema or ulceration- appearance consistent with a penduculated skin tag with irregularity; At the cephalad end of the gluteal cleft there are erosions bilaterally, no fluctuance or warmth; there is thickening of the left greater toenail and some of the second toenail as well, no discharge  Penile shaft superiorly with round lesion. Umbilicated. Possibly molluscum contagiosum       Results       Data reviewed:      Current Outpatient Medications   Medication Sig    acyclovir  (ZOVIRAX ) 400 mg Oral Tablet Take 1 Tablet (400 mg total) by mouth Three times  a day    aspirin  (ECOTRIN) 81 mg Oral Tablet, Delayed Release (E.C.) take 1 tablet every day for afib    azelastine  (ASTELIN ) 137 mcg (0.1 %) Nasal Spray, Non-Aerosol Administer 1 Spray into each nostril Twice daily Use in each nostril as directed    clonazePAM  (KLONOPIN ) 1 mg Oral Tablet Take 1 Tablet (1 mg total) by mouth Twice daily Indications: Anxiety and sleep aid.    dicyclomine  (BENTYL ) 10 mg Oral Capsule Take 1 Capsule (10 mg total) by mouth Three times a day as needed    dilTIAZem  (CARDIZEM  CD) 240 mg Oral Capsule, Sust. Release 24 hr Take 1 Capsule (240 mg total) by mouth Every morning Do not take if sbp<115 or heart rate<60    linaCLOtide  (LINZESS ) 145 mcg Oral Capsule Take 1 Capsule (145 mcg total) by mouth Every morning Indications: irritable bowel syndrome with constipation    lisinopriL  (PRINIVIL ) 20 mg Oral Tablet Take 1 Tablet (20 mg total) by mouth Daily    loratadine  (CLARITIN ) 10 mg Oral Tablet Take 1 Tablet (10 mg total) by mouth Daily    montelukast  (SINGULAIR ) 10 mg Oral Tablet Take 1 Tablet (10 mg total) by mouth Once per day as needed    mupirocin  (BACTROBAN ) 2 % Ointment Apply topically Three times a day    NALOXONE 4 MG/ACTUATION NASAL SPRAY - ED TO GO 1 Spray by INTRANASAL route Every 2 minutes as needed for actual or suspected opioid overdose. Call 911 if used.    nitroGLYCERIN  (NITROSTAT ) 0.4 mg Sublingual Tablet, Sublingual Place 1 Tablet (0.4 mg total) under the tongue Every 5 minutes as needed for Chest pain for 3 doses over 15 minutes    pantoprazole  (PROTONIX ) 40 mg Oral Tablet, Delayed Release (E.C.) Take 1 Tablet (40 mg total) by mouth Daily    Resorcinol-Petro-Cala-Zinc-Lan (RESINOL) 2-55 % Ointment Apply topically Four times a day    rimegepant (NURTEC ODT ) 75 mg Oral Tablet, Rapid Dissolve Place 1 Tablet (75 mg total) under the tongue Once per day as needed for Migraine    rivaroxaban  (XARELTO ) 20 mg Oral Tablet Take 1 Tablet (20 mg total) by mouth Daily Indications:  treatment to prevent blood clots in chronic atrial fibrillation    rosuvastatin  (CRESTOR ) 10 mg Oral Tablet Take 1 Tablet (10 mg total) by mouth Every evening    Sildenafil  (VIAGRA ) 100 mg Oral  Tablet Take 1 Tablet (100 mg total) by mouth Every 24 hours as needed Indications: the inability to have an erection    solifenacin  (VESICARE ) 10 mg Oral Tablet Take 1 Tablet (10 mg total) by mouth Daily    sucralfate  (CARAFATE ) 1 gram Oral Tablet Take 1 Tablet (1 g total) by mouth Every 6 hours    tadalafil  (CIALIS ) 5 mg Oral Tablet Take 1 Tablet (5 mg total) by mouth Every 24 hours as needed    tamsulosin  (FLOMAX ) 0.4 mg Oral Capsule Take 1 Capsule (0.4 mg total) by mouth Every evening after dinner    terbinafine  HCL (LAMISIL ) 250 mg Oral Tablet Take 1 Tablet (250 mg total) by mouth Daily    triamcinolone  acetonide 0.1 % Ointment Apply topically Twice daily     I was personally present when the student was taking history, performing the exam, and during any medical decision-making activity.  I personally verified the history, performed the exam, and medical decision-making as edited in the student's note. I agree with the medical student's documentation.    Jhonny Romano, DO     [place], DO     This note was created with assistance from [employer] via capture of conversational audio. Consent was obtained from the patient and all parties present prior to recording.

## 2024-08-09 ENCOUNTER — Encounter (HOSPITAL_BASED_OUTPATIENT_CLINIC_OR_DEPARTMENT_OTHER): Payer: Self-pay

## 2024-08-09 ENCOUNTER — Emergency Department (HOSPITAL_BASED_OUTPATIENT_CLINIC_OR_DEPARTMENT_OTHER)

## 2024-08-09 ENCOUNTER — Inpatient Hospital Stay
Admission: EM | Admit: 2024-08-09 | Discharge: 2024-08-10 | DRG: 282 | Disposition: A | Discharge status: Home / Self Care | Attending: Internal Medicine | Admitting: Internal Medicine

## 2024-08-09 ENCOUNTER — Other Ambulatory Visit: Payer: Self-pay

## 2024-08-09 ENCOUNTER — Inpatient Hospital Stay (HOSPITAL_COMMUNITY): Admitting: Internal Medicine

## 2024-08-09 DIAGNOSIS — Z79899 Other long term (current) drug therapy: Secondary | ICD-10-CM

## 2024-08-09 DIAGNOSIS — E785 Hyperlipidemia, unspecified: Secondary | ICD-10-CM

## 2024-08-09 DIAGNOSIS — R079 Chest pain, unspecified: Principal | ICD-10-CM

## 2024-08-09 DIAGNOSIS — R0602 Shortness of breath: Secondary | ICD-10-CM

## 2024-08-09 DIAGNOSIS — N182 Chronic kidney disease, stage 2 (mild): Secondary | ICD-10-CM | POA: Diagnosis present

## 2024-08-09 DIAGNOSIS — E7800 Pure hypercholesterolemia, unspecified: Secondary | ICD-10-CM | POA: Diagnosis present

## 2024-08-09 DIAGNOSIS — G43909 Migraine, unspecified, not intractable, without status migrainosus: Secondary | ICD-10-CM | POA: Diagnosis present

## 2024-08-09 DIAGNOSIS — Z7982 Long term (current) use of aspirin: Secondary | ICD-10-CM

## 2024-08-09 DIAGNOSIS — I480 Paroxysmal atrial fibrillation: Secondary | ICD-10-CM | POA: Diagnosis present

## 2024-08-09 DIAGNOSIS — F419 Anxiety disorder, unspecified: Secondary | ICD-10-CM | POA: Diagnosis present

## 2024-08-09 DIAGNOSIS — I21A1 Myocardial infarction type 2: Secondary | ICD-10-CM | POA: Diagnosis present

## 2024-08-09 DIAGNOSIS — R0789 Other chest pain: Principal | ICD-10-CM | POA: Diagnosis present

## 2024-08-09 DIAGNOSIS — F32A Depression, unspecified: Secondary | ICD-10-CM | POA: Diagnosis present

## 2024-08-09 DIAGNOSIS — R42 Dizziness and giddiness: Secondary | ICD-10-CM

## 2024-08-09 DIAGNOSIS — R7989 Other specified abnormal findings of blood chemistry: Secondary | ICD-10-CM | POA: Diagnosis present

## 2024-08-09 DIAGNOSIS — F1010 Alcohol abuse, uncomplicated: Secondary | ICD-10-CM | POA: Diagnosis present

## 2024-08-09 DIAGNOSIS — I10 Essential (primary) hypertension: Secondary | ICD-10-CM | POA: Diagnosis present

## 2024-08-09 DIAGNOSIS — T461X6A Underdosing of calcium-channel blockers, initial encounter: Secondary | ICD-10-CM | POA: Diagnosis present

## 2024-08-09 DIAGNOSIS — I129 Hypertensive chronic kidney disease with stage 1 through stage 4 chronic kidney disease, or unspecified chronic kidney disease: Secondary | ICD-10-CM | POA: Diagnosis present

## 2024-08-09 DIAGNOSIS — Z91128 Patient's intentional underdosing of medication regimen for other reason: Secondary | ICD-10-CM

## 2024-08-09 DIAGNOSIS — Z7901 Long term (current) use of anticoagulants: Secondary | ICD-10-CM

## 2024-08-09 DIAGNOSIS — R001 Bradycardia, unspecified: Secondary | ICD-10-CM

## 2024-08-09 DIAGNOSIS — I4891 Unspecified atrial fibrillation: Secondary | ICD-10-CM | POA: Diagnosis present

## 2024-08-09 DIAGNOSIS — K2100 Gastro-esophageal reflux disease with esophagitis, without bleeding: Secondary | ICD-10-CM | POA: Diagnosis present

## 2024-08-09 DIAGNOSIS — I351 Nonrheumatic aortic (valve) insufficiency: Secondary | ICD-10-CM | POA: Diagnosis present

## 2024-08-09 DIAGNOSIS — R9431 Abnormal electrocardiogram [ECG] [EKG]: Secondary | ICD-10-CM

## 2024-08-09 HISTORY — DX: Gastro-esophageal reflux disease without esophagitis: K21.9

## 2024-08-09 HISTORY — DX: Fracture of unspecified part of neck of unspecified femur, initial encounter for closed fracture: S72.009A

## 2024-08-09 LAB — ECG 12 LEAD
Atrial Rate: 57 {beats}/min
Atrial Rate: 53 {beats}/min
Calculated P Axis: 29 degrees
Calculated P Axis: -5 degrees
Calculated R Axis: 67 degrees
Calculated R Axis: 116 degrees
Calculated T Axis: 55 degrees
Calculated T Axis: 82 degrees
PR Interval: 130 ms
PR Interval: 168 ms
QRS Duration: 88 ms
QRS Duration: 88 ms
QT Interval: 448 ms
QT Interval: 460 ms
QTC Calculation: 436 ms
QTC Calculation: 431 ms
Ventricular rate: 57 {beats}/min
Ventricular rate: 53 {beats}/min

## 2024-08-09 LAB — URINALYSIS, MACRO/MICRO
BLOOD: NEGATIVE mg/dL
GLUCOSE: NEGATIVE mg/dL
KETONES: NEGATIVE mg/dL
LEUKOCYTES: NEGATIVE WBCs/uL
NITRITE: NEGATIVE
PH: 6 (ref 4.6–8.0)
PROTEIN: NEGATIVE mg/dL
SPECIFIC GRAVITY: 1.02 (ref 1.003–1.035)
UROBILINOGEN: 0.2 mg/dL (ref 0.2–1.0)

## 2024-08-09 LAB — COMPREHENSIVE METABOLIC PANEL, NON-FASTING
ALBUMIN/GLOBULIN RATIO: 1.3 (ref 0.8–1.4)
ALBUMIN: 3.6 g/dL (ref 3.4–5.0)
ALKALINE PHOSPHATASE: 45 U/L — ABNORMAL LOW (ref 46–116)
ALT (SGPT): 20 U/L (ref <=78)
ANION GAP: 12 mmol/L (ref 4–13)
AST (SGOT): 17 U/L (ref 15–37)
BILIRUBIN TOTAL: 0.6 mg/dL (ref 0.2–1.0)
BUN/CREA RATIO: 13
BUN: 17 mg/dL (ref 7–18)
CALCIUM, CORRECTED: 9.6 mg/dL
CALCIUM: 9.3 mg/dL (ref 8.5–10.1)
CHLORIDE: 109 mmol/L — ABNORMAL HIGH (ref 98–107)
CO2 TOTAL: 23 mmol/L (ref 21–32)
CREATININE: 1.36 mg/dL — ABNORMAL HIGH (ref 0.70–1.30)
ESTIMATED GFR: 55 mL/min/1.73mˆ2 — ABNORMAL LOW (ref >59)
GLOBULIN: 2.7
GLUCOSE: 150 mg/dL — ABNORMAL HIGH (ref 74–106)
OSMOLALITY, CALCULATED: 291 mosm/kg — ABNORMAL HIGH (ref 270–290)
POTASSIUM: 3.8 mmol/L (ref 3.5–5.1)
PROTEIN TOTAL: 6.3 g/dL — ABNORMAL LOW (ref 6.4–8.2)
SODIUM: 144 mmol/L (ref 136–145)

## 2024-08-09 LAB — CBC WITH DIFF
BASOPHIL #: 0.02 x10ˆ3/uL (ref 0.00–0.10)
BASOPHIL %: 0 % (ref 0–1)
EOSINOPHIL #: 0.05 x10ˆ3/uL (ref 0.00–0.60)
EOSINOPHIL %: 1 % (ref 1–8)
HCT: 40.5 % (ref 36.7–47.1)
HGB: 13.6 g/dL (ref 12.5–16.3)
LYMPHOCYTE #: 0.79 x10ˆ3/uL — ABNORMAL LOW (ref 1.00–3.00)
LYMPHOCYTE %: 18 % (ref 15–43)
MCH: 33.7 pg — ABNORMAL HIGH (ref 23.8–33.4)
MCHC: 33.7 g/dL (ref 32.5–36.3)
MCV: 99.9 fL — ABNORMAL HIGH (ref 73.0–96.2)
MONOCYTE #: 0.26 x10ˆ3/uL — ABNORMAL LOW (ref 0.30–1.00)
MONOCYTE %: 6 % (ref 6–14)
MPV: 8 fL (ref 7.4–11.4)
NEUTROPHIL #: 3.27 x10ˆ3/uL (ref 1.85–7.84)
NEUTROPHIL %: 74 % (ref 44–74)
PLATELETS: 169 x10ˆ3/uL (ref 140–440)
RBC: 4.05 x10ˆ6/uL — ABNORMAL LOW (ref 4.06–5.63)
RDW: 13.3 % (ref 12.1–16.2)
WBC: 4.4 x10ˆ3/uL (ref 3.6–10.2)

## 2024-08-09 LAB — PT/INR
INR: 1.29 — ABNORMAL HIGH (ref 0.84–1.10)
PROTHROMBIN TIME: 14.5 s — ABNORMAL HIGH (ref 9.8–12.7)

## 2024-08-09 LAB — TROPONIN-I
TROPONIN I: 13 ng/L (ref <20)
TROPONIN I: 26 ng/L — ABNORMAL HIGH (ref <20)
TROPONIN I: 52 ng/L — ABNORMAL HIGH (ref <20)
TROPONIN I: 35 ng/L — ABNORMAL HIGH (ref <20)

## 2024-08-09 LAB — PTT (PARTIAL THROMBOPLASTIN TIME): APTT: 32.1 s (ref 25.0–38.0)

## 2024-08-09 LAB — MAGNESIUM: MAGNESIUM: 2 mg/dL (ref 1.8–2.4)

## 2024-08-09 MED ORDER — DILTIAZEM CD 240 MG CAPSULE,EXTENDED RELEASE 24 HR
240.0000 mg | ORAL_CAPSULE | Freq: Every morning | ORAL | Status: DC
Start: 2024-08-10 — End: 2024-08-10
  Administered 2024-08-10: 0 mg via ORAL
  Filled 2024-08-09: qty 1

## 2024-08-09 MED ORDER — ACETAMINOPHEN 325 MG TABLET
650.0000 mg | ORAL_TABLET | Freq: Four times a day (QID) | ORAL | Status: DC | PRN
Start: 2024-08-09 — End: 2024-08-10
  Administered 2024-08-09: 650 mg via ORAL
  Filled 2024-08-09: qty 2

## 2024-08-09 MED ORDER — HYDROCODONE 5 MG-ACETAMINOPHEN 325 MG TABLET
ORAL_TABLET | ORAL | Status: AC
Start: 2024-08-09 — End: 2024-08-09
  Filled 2024-08-09: qty 1

## 2024-08-09 MED ORDER — ONDANSETRON HCL (PF) 4 MG/2 ML INJECTION SOLUTION
8.0000 mg | Freq: Three times a day (TID) | INTRAMUSCULAR | Status: DC | PRN
Start: 2024-08-09 — End: 2024-08-10

## 2024-08-09 MED ORDER — ASPIRIN 81 MG CHEWABLE TABLET
CHEWABLE_TABLET | ORAL | Status: AC
Start: 2024-08-09 — End: 2024-08-09
  Filled 2024-08-09: qty 2

## 2024-08-09 MED ORDER — ASPIRIN 81 MG CHEWABLE TABLET
162.0000 mg | CHEWABLE_TABLET | ORAL | Status: AC
Start: 2024-08-09 — End: 2024-08-09
  Administered 2024-08-09: 162 mg via ORAL

## 2024-08-09 MED ORDER — ATORVASTATIN 20 MG TABLET
20.0000 mg | ORAL_TABLET | Freq: Every evening | ORAL | Status: DC
Start: 2024-08-09 — End: 2024-08-10
  Administered 2024-08-09: 20 mg via ORAL
  Filled 2024-08-09: qty 1

## 2024-08-09 MED ORDER — RIVAROXABAN 10 MG TABLET
20.0000 mg | ORAL_TABLET | Freq: Every day | ORAL | Status: DC
Start: 2024-08-09 — End: 2024-08-10
  Administered 2024-08-09 – 2024-08-10 (×2): 20 mg via ORAL
  Filled 2024-08-09 (×2): qty 2

## 2024-08-09 MED ORDER — PANTOPRAZOLE 40 MG TABLET,DELAYED RELEASE
40.0000 mg | DELAYED_RELEASE_TABLET | Freq: Every day | ORAL | Status: DC
Start: 2024-08-09 — End: 2024-08-10
  Administered 2024-08-09 – 2024-08-10 (×2): 40 mg via ORAL
  Filled 2024-08-09 (×2): qty 1

## 2024-08-09 MED ORDER — ASPIRIN 81 MG CHEWABLE TABLET
81.0000 mg | CHEWABLE_TABLET | Freq: Every day | ORAL | Status: DC
Start: 2024-08-10 — End: 2024-08-10
  Administered 2024-08-10: 81 mg via ORAL
  Filled 2024-08-09: qty 1

## 2024-08-09 MED ORDER — CLONAZEPAM 1 MG TABLET
1.0000 mg | ORAL_TABLET | Freq: Two times a day (BID) | ORAL | Status: DC
Start: 2024-08-09 — End: 2025-07-05
  Administered 2024-08-09 – 2024-08-10 (×2): 1 mg via ORAL
  Filled 2024-08-09 (×2): qty 1

## 2024-08-09 MED ORDER — IPRATROPIUM 0.5 MG-ALBUTEROL 3 MG (2.5 MG BASE)/3 ML NEBULIZATION SOLN
3.0000 mL | INHALATION_SOLUTION | RESPIRATORY_TRACT | Status: DC | PRN
Start: 2024-08-09 — End: 2024-08-10

## 2024-08-09 MED ORDER — POLYETHYLENE GLYCOL 3350 17 GRAM ORAL POWDER PACKET
17.0000 g | Freq: Every day | ORAL | Status: DC
Start: 2024-08-09 — End: 2024-08-10
  Administered 2024-08-09 – 2024-08-10 (×2): 17 g via ORAL
  Filled 2024-08-09 (×2): qty 1

## 2024-08-09 MED ORDER — TAMSULOSIN 0.4 MG CAPSULE
0.4000 mg | ORAL_CAPSULE | Freq: Every evening | ORAL | Status: DC
Start: 2024-08-09 — End: 2024-08-10
  Administered 2024-08-09: 0.4 mg via ORAL
  Filled 2024-08-09: qty 1

## 2024-08-09 MED ORDER — HYDROCODONE 5 MG-ACETAMINOPHEN 325 MG TABLET
1.0000 | ORAL_TABLET | ORAL | Status: AC
Start: 2024-08-09 — End: 2024-08-09
  Administered 2024-08-09: 1 via ORAL

## 2024-08-09 NOTE — ED Provider Notes (Signed)
 [facility], [place] - Emergency Department  ED Primary Note  History of Present Illness   Ricky Grimes is a 73 y.o. male who had concerns including Dizziness.  This 73 year old male patient presents emergency department with chest pain onset about 830 this morning while he was at the dermatologist office, you took 2 baby aspirins, nitroglycerin  once in the pain resolved.  He has had the pain was indescribable, no pressure heaviness or tightness, no associated dyspnea or diaphoresis but he felt palpitations, and also felt dizzy and weak for a a minute or 2.  The nitroglycerin  and aspirin  resolve the pain completely.  He is on Xarelto  for blood thinning due to paroxysmal atrial fib.    Past medical history:  Essential hypertension, paroxysmal atrial fibrillation, migraine headache, hyperlipidemia, anxiety, gastroesophageal reflux disease, you have fracture, rotator cuff injury, arthritis of the knees.    Past surgical history:  Left knee arthroscopy   Social history: He has never smoked, vaped, use marijuana or illicit drugs.  He does occasionally use alcohol.      Physical Exam   ED Triage Vitals [08/09/24 0928]   BP (Non-Invasive) (!) 115/51   Heart Rate 66   Respiratory Rate 18   Temperature (!) 35.8 C (96.5 F)   SpO2 98 %   Weight 77.1 kg (170 lb)   Height 1.727 m (5' 8)     Physical Exam  General: No acute distress, nontoxic   Neck: Supple, no JVD, adenopathy, trachea is in the midline.  No carotid bruits.    Lungs: Clear symmetrical bilaterally with good aeration   Heart:  Regular rate and rhythm normal S1-S2 with no murmur, in his slightly bradycardia rate, 1 premature extrasystole with pause was noted during auscultation.    Abdomen: Soft, normal bowel sounds, nontender   Extremities: Moving symmetrically  Skin: No suspicious rashes, lesions, edema.      Patient Data   Labs Ordered/Reviewed   COMPREHENSIVE METABOLIC PANEL, NON-FASTING - Abnormal; Notable for the following  components:       Result Value    CHLORIDE 109 (*)     CREATININE 1.36 (*)     ESTIMATED GFR 55 (*)     GLUCOSE 150 (*)     ALKALINE PHOSPHATASE 45 (*)     PROTEIN TOTAL 6.3 (*)     OSMOLALITY, CALCULATED 291 (*)     All other components within normal limits    Narrative:     Estimated Glomerular Filtration Rate (eGFR) is calculated using the CKD-EPI (2021) equation, intended for patients 63 years of age and older. If gender is not documented or unknown, there will be no eGFR calculation.   PT/INR - Abnormal; Notable for the following components:    PROTHROMBIN TIME 14.5 (*)     INR 1.29 (*)     All other components within normal limits    Narrative:     In the setting of warfarin therapy, a moderate-intensity INR goal range is 2.0 to 3.0 and a high-intensity INR goal range is 2.5 to 3.5.    INR is ONLY validated to determine the level of anticoagulation with vitamin K antagonists (warfarin). Other factors may elevate the INR including but not limited to direct oral anticoagulants (DOACs), liver dysfunction, vitamin K deficiency, DIC, factor deficiencies, and factor inhibitors.   TROPONIN-I - Abnormal; Notable for the following components:    TROPONIN I 26 (*)     All other components within normal limits  Narrative:     Values received on females ranging between 12-15 ng/L MUST include the next serial troponin to review changes in the delta differences as the reference range for the Access II chemistry analyzer is lower than the established reference range.     TROPONIN-I - Abnormal; Notable for the following components:    TROPONIN I 52 (*)     All other components within normal limits    Narrative:     Values received on females ranging between 12-15 ng/L MUST include the next serial troponin to review changes in the delta differences as the reference range for the Access II chemistry analyzer is lower than the established reference range.     CBC WITH DIFF - Abnormal; Notable for the following components:     RBC 4.05 (*)     MCV 99.9 (*)     MCH 33.7 (*)     LYMPHOCYTE # 0.79 (*)     MONOCYTE # 0.26 (*)     All other components within normal limits   URINALYSIS, MACRO/MICRO - Abnormal; Notable for the following components:    BILIRUBIN Small (*)     All other components within normal limits   PTT (PARTIAL THROMBOPLASTIN TIME) - Normal   TROPONIN-I - Normal    Narrative:     Values received on females ranging between 12-15 ng/L MUST include the next serial troponin to review changes in the delta differences as the reference range for the Access II chemistry analyzer is lower than the established reference range.     MAGNESIUM  - Normal   CBC/DIFF    Narrative:     The following orders were created for panel order CBC/DIFF.  Procedure                               Abnormality         Status                     ---------                               -----------         ------                     CBC WITH IPQQ[[id number]]                Abnormal            Final result                 Please view results for these tests on the individual orders.   URINALYSIS WITH REFLEX MICROSCOPIC AND CULTURE IF POSITIVE    Narrative:     The following orders were created for panel order URINALYSIS WITH REFLEX MICROSCOPIC AND CULTURE IF POSITIVE.  Procedure                               Abnormality         Status                     ---------                               -----------         ------  URINALYSIS, MACRO/MICRO[[id number]]      Abnormal            Final result                 Please view results for these tests on the individual orders.     XR AP MOBILE CHEST   Final Result by Edi, Radresults In (09/10 1031)      NO ACUTE FINDINGS.         Radiologist location ID: [id number]           Medical Decision Making        Medical Decision Making  This 72 year old male patient presents emergency department with complaints of chest pain, anterior sternal/left lower sternal area of the chest without radiation.  Pain  duration was about 5 minutes, you did have some dizziness with this is well and abnormal sensation in his chest.  Does has a history of paroxysmal atrial fibrillation, is on Cardizem  and Xarelto .  Chest x-ray noted, labs reviewed noted, troponin went from 13 to 26 and 1 hour, awaiting the 3 hour troponin.  He has been asymptomatic since nitroglycerin , with a history of paroxysmal atrial fibrillation you could have had a paroxysm of atrial fib, with a type 3 ischemic event which is elevated 2 troponin.  No a arrhythmias other than an occasional APC noted on the telemetry strips thus far, we will get a repeat EKG with a 3rd troponin.    Amount and/or Complexity of Data Reviewed  Labs: ordered.  Radiology: ordered.  ECG/medicine tests: ordered.    Risk  OTC drugs.      ED Course as of 08/09/24 1435   Wed Aug 09, 2024   1225 Patient's 1st troponin is 13, 2nd troponin came back at 26, magnesium  potassium are good, renal function has a GFR 55.  PT INR is up at 1.29 PTT is normal, await 3rd troponin, he has been asymptomatic since his nitroglycerin  and aspirin  when symptoms started, he may have gone into an SVT/atrial fib RVR and this is paroxysmal which can elevate your troponin.  Await to 3 hour troponin, we will discussed with a his cardiologist, Dr. Sheralyn.   1432 Patient accepted to telemetry, he is now complaining of a migraine headache and wants to take his migraine medications, we will give an alternative due to the vascular effects of 5 HCT blockers.   1435 Patient was given a 5 mg hydrocodone  for his headache.            Medications Ordered/Administered in the ED   aspirin  chewable tablet 162 mg (162 mg Oral Given 08/09/24 1024)     Clinical Impression   Chest pain at rest (Primary)   Elevated troponin I level   Paroxysmal atrial fibrillation (CMS HCC)   Essential hypertension   Hyperlipidemia, unspecified hyperlipidemia type       Disposition: Admitted

## 2024-08-09 NOTE — ED Nurses Note (Signed)
 Medicated for migraine  stated has medication at home  could we please give him something

## 2024-08-09 NOTE — ED Nurses Note (Signed)
 Transported to [facility]  room 364A to [facility]  report called to primary nurse

## 2024-08-09 NOTE — H&P (Signed)
 [facility]  Admission H&P      Date of Service:  08/09/2024  Ricky Grimes [id number] y.o. male  Date of Admission:  08/09/2024  Date of Birth:  1951/11/23      Chief Complaint: Chest Pain    HPI: Ricky Grimes is a 73 y.o., White male who presents with complaints of an episode of chest pain this morning.  Pain was resolved with 1 nitroglycerin .  He denied diaphoresis or dyspnea but has had some palpitations.  Patient states that he has been taking his medications, however he states he accidentally left his Cardizem  at someone's house and has not had it for a couple of days.  He does have atrial fibrillation has been taking his Xarelto  and all other medications.  Patient states that he does drink 1-2 beers most nights, however he has never had any withdrawal symptoms and has stopped abruptly, multiple times without any issues.  Initial troponin was 13, repeat 26 and 3rd troponin was 52.  EKG shows no ST-T abnormalities.  Echocardiogram 1 year ago showed severe aortic insufficiency, patient states that he was referred to [facility] to a cardiologist and states he was told that he has moderate aortic insufficiency and is stable.    History:    Past Medical:    Past Medical History:   Diagnosis Date    Anticoagulant long-term use     Anxiety     Atrial fibrillation     GERD (gastroesophageal reflux disease)     Hip fracture     right hip no surgery needed    Hypercholesterolemia     Hypertension     Migraine     Rectal bleeding 07/29/2022    S/P rotator cuff repair 07/12/2012    Wears glasses      Past Surgical:    Past Surgical History:   Procedure Laterality Date    KNEE ARTHROSCOPY Left      Family:    Family Medical History:       Problem Relation (Age of Onset)    Atrial fibrillation Father    Cancer Father    No Known Problems Mother, Sister, Brother, Half-Sister, Half-Brother, Maternal Aunt, Maternal Uncle, Paternal Aunt, Paternal Uncle, Maternal Grandmother, Maternal Grandfather, Paternal Grandmother,  Paternal Grandfather, Daughter, Son          Social:   reports that he has never smoked. He has never used smokeless tobacco. He reports current alcohol use of about 14.0 standard drinks of alcohol per week. He reports that he does not use drugs.    Allergies[1]  Medications Prior to Admission       Prescriptions    aspirin  (ECOTRIN) 81 mg Oral Tablet, Delayed Release (E.C.)    take 1 tablet every day for afib    clonazePAM  (KLONOPIN ) 1 mg Oral Tablet    Take 1 Tablet (1 mg total) by mouth Twice daily Indications: Anxiety and sleep aid.    dicyclomine  (BENTYL ) 10 mg Oral Capsule    Take 1 Capsule (10 mg total) by mouth Three times a day as needed    dilTIAZem  (CARDIZEM  CD) 240 mg Oral Capsule, Sust. Release 24 hr    Take 1 Capsule (240 mg total) by mouth Every morning Do not take if sbp<115 or heart rate<60    linaCLOtide  (LINZESS ) 145 mcg Oral Capsule    Take 1 Capsule (145 mcg total) by mouth Every morning Indications: irritable bowel syndrome with constipation    lisinopriL  (PRINIVIL ) 20 mg  Oral Tablet    Take 1 Tablet (20 mg total) by mouth Daily    montelukast  (SINGULAIR ) 10 mg Oral Tablet    Take 1 Tablet (10 mg total) by mouth Once per day as needed    NALOXONE 4 MG/ACTUATION NASAL SPRAY - ED TO GO    1 Spray by INTRANASAL route Every 2 minutes as needed for actual or suspected opioid overdose. Call 911 if used.    nitroGLYCERIN  (NITROSTAT ) 0.4 mg Sublingual Tablet, Sublingual    Place 1 Tablet (0.4 mg total) under the tongue Every 5 minutes as needed for Chest pain for 3 doses over 15 minutes    pantoprazole  (PROTONIX ) 40 mg Oral Tablet, Delayed Release (E.C.)    Take 1 Tablet (40 mg total) by mouth Daily    rimegepant (NURTEC ODT ) 75 mg Oral Tablet, Rapid Dissolve    Place 1 Tablet (75 mg total) under the tongue Once per day as needed for Migraine    rivaroxaban  (XARELTO ) 20 mg Oral Tablet    Take 1 Tablet (20 mg total) by mouth Daily Indications: treatment to prevent blood clots in chronic atrial  fibrillation    rosuvastatin  (CRESTOR ) 10 mg Oral Tablet    Take 1 Tablet (10 mg total) by mouth Every evening    Sildenafil  (VIAGRA ) 100 mg Oral Tablet    Take 1 Tablet (100 mg total) by mouth Every 24 hours as needed Indications: the inability to have an erection    solifenacin  (VESICARE ) 10 mg Oral Tablet    Take 1 Tablet (10 mg total) by mouth Daily    sucralfate  (CARAFATE ) 1 gram Oral Tablet    Take 1 Tablet (1 g total) by mouth Every 6 hours    tamsulosin  (FLOMAX ) 0.4 mg Oral Capsule    Take 1 Capsule (0.4 mg total) by mouth Every evening after dinner    terbinafine  HCL (LAMISIL ) 250 mg Oral Tablet    Take 1 Tablet (250 mg total) by mouth Daily          acetaminophen  (TYLENOL ) tablet, 650 mg, Oral, Q6H PRN  [START ON 08/10/2024] aspirin  chewable tablet 81 mg, 81 mg, Oral, Daily  atorvastatin  (LIPITOR) tablet, 20 mg, Oral, QPM  [START ON 08/10/2024] dilTIAZem  (CARDIZEM  CD) 24 hr extended release capsule, 240 mg, Oral, QAM  ipratropium-albuterol  0.5 mg-3 mg(2.5 mg base)/3 mL Solution for Nebulization, 3 mL, Nebulization, Q4H PRN  ondansetron  (ZOFRAN ) 2 mg/mL injection, 8 mg, Intravenous, Q8H PRN  pantoprazole  (PROTONIX ) delayed release tablet, 40 mg, Oral, Daily  polyethylene glycol (MIRALAX ) oral packet, 17 g, Oral, Daily  rivaroxaban  (XARELTO ) tablet, 20 mg, Oral, Daily  tamsulosin  (FLOMAX ) capsule, 0.4 mg, Oral, Daily after Dinner        ROS:   Review of Systems    All other systems negative unless marked.       Exam:  Vitals:    08/09/24 1600 08/09/24 1630 08/09/24 1805 08/09/24 1824   BP:  126/67  (!) 143/60   Pulse: 57 69 70 64   Resp: 19 13  16    Temp:  36.2 C (97.2 F)  36.4 C (97.6 F)   SpO2: 98% 98%  98%   Weight:    74.8 kg (164 lb 12.8 oz)   Height:    1.727 m (5' 8)   BMI:    25.06             Physical Exam  HENT:      Mouth/Throat:  Mouth: Mucous membranes are moist.   Cardiovascular:      Rate and Rhythm: Normal rate and regular rhythm.      Pulses: Normal pulses.      Heart sounds: Normal  heart sounds.   Pulmonary:      Effort: Pulmonary effort is normal.      Breath sounds: Normal breath sounds.   Abdominal:      General: Bowel sounds are normal.      Palpations: Abdomen is soft.      Tenderness: There is no abdominal tenderness.   Musculoskeletal:      Cervical back: Neck supple.      Right lower leg: 1+ Edema present.      Left lower leg: 1+ Edema present.   Skin:     General: Skin is warm and dry.      Capillary Refill: Capillary refill takes less than 2 seconds.   Neurological:      Mental Status: He is alert.             Labs:     Results for orders placed or performed during the hospital encounter of 08/09/24 (from the past 24 hours)   CBC/DIFF    Collection Time: 08/09/24 10:11 AM    Narrative    The following orders were created for panel order CBC/DIFF.  Procedure                               Abnormality         Status                     ---------                               -----------         ------                     CBC WITH IPQQ[[id number]]                Abnormal            Final result                 Please view results for these tests on the individual orders.   COMPREHENSIVE METABOLIC PANEL, NON-FASTING    Collection Time: 08/09/24 10:11 AM   Result Value Ref Range    SODIUM 144 136 - 145 mmol/L    POTASSIUM 3.8 3.5 - 5.1 mmol/L    CHLORIDE 109 (H) 98 - 107 mmol/L    CO2 TOTAL 23 21 - 32 mmol/L    ANION GAP 12 4 - 13 mmol/L    BUN 17 7 - 18 mg/dL    CREATININE 8.63 (H) 0.70 - 1.30 mg/dL    BUN/CREA RATIO 13     ESTIMATED GFR 55 (L) >59 mL/min/1.44m^2    ALBUMIN 3.6 3.4 - 5.0 g/dL    CALCIUM 9.3 8.5 - 89.8 mg/dL    GLUCOSE 849 (H) 74 - 106 mg/dL    ALKALINE PHOSPHATASE 45 (L) 46 - 116 U/L    ALT (SGPT) 20 <=78 U/L    AST (SGOT) 17 15 - 37 U/L    BILIRUBIN TOTAL 0.6 0.2 - 1.0 mg/dL    PROTEIN TOTAL 6.3 (L) 6.4 - 8.2 g/dL    ALBUMIN/GLOBULIN RATIO 1.3 0.8 -  1.4    OSMOLALITY, CALCULATED 291 (H) 270 - 290 mOsm/kg    CALCIUM, CORRECTED 9.6 mg/dL    GLOBULIN 2.7     Narrative     Estimated Glomerular Filtration Rate (eGFR) is calculated using the CKD-EPI (2021) equation, intended for patients 60 years of age and older. If gender is not documented or unknown, there will be no eGFR calculation.   PT/INR    Collection Time: 08/09/24 10:11 AM   Result Value Ref Range    PROTHROMBIN TIME 14.5 (H) 9.8 - 12.7 seconds    INR 1.29 (H) 0.84 - 1.10    Narrative    In the setting of warfarin therapy, a moderate-intensity INR goal range is 2.0 to 3.0 and a high-intensity INR goal range is 2.5 to 3.5.    INR is ONLY validated to determine the level of anticoagulation with vitamin K antagonists (warfarin). Other factors may elevate the INR including but not limited to direct oral anticoagulants (DOACs), liver dysfunction, vitamin K deficiency, DIC, factor deficiencies, and factor inhibitors.   PTT (PARTIAL THROMBOPLASTIN TIME)    Collection Time: 08/09/24 10:11 AM   Result Value Ref Range    APTT 32.1 25.0 - 38.0 seconds   TROPONIN-I NOW    Collection Time: 08/09/24 10:11 AM   Result Value Ref Range    TROPONIN I 13 <20 ng/L    Narrative    Values received on females ranging between 12-15 ng/L MUST include the next serial troponin to review changes in the delta differences as the reference range for the Access II chemistry analyzer is lower than the established reference range.     MAGNESIUM     Collection Time: 08/09/24 10:11 AM   Result Value Ref Range    MAGNESIUM  2.0 1.8 - 2.4 mg/dL   CBC WITH DIFF    Collection Time: 08/09/24 10:11 AM   Result Value Ref Range    WBC 4.4 3.6 - 10.2 x10^3/uL    RBC 4.05 (L) 4.06 - 5.63 x10^6/uL    HGB 13.6 12.5 - 16.3 g/dL    HCT 59.4 63.2 - 52.8 %    MCV 99.9 (H) 73.0 - 96.2 fL    MCH 33.7 (H) 23.8 - 33.4 pg    MCHC 33.7 32.5 - 36.3 g/dL    RDW 86.6 87.8 - 83.7 %    PLATELETS 169 140 - 440 x10^3/uL    MPV 8.0 7.4 - 11.4 fL    NEUTROPHIL % 74 44 - 74 %    LYMPHOCYTE % 18 15 - 43 %    MONOCYTE % 6 6 - 14 %    EOSINOPHIL % 1 1 - 8 %    BASOPHIL % 0 0 - 1 %    NEUTROPHIL  # 3.27 1.85 - 7.84 x10^3/uL    LYMPHOCYTE # 0.79 (L) 1.00 - 3.00 x10^3/uL    MONOCYTE # 0.26 (L) 0.30 - 1.00 x10^3/uL    EOSINOPHIL # 0.05 0.00 - 0.60 x10^3/uL    BASOPHIL # 0.02 0.00 - 0.10 x10^3/uL   TROPONIN-I IN ONE HOUR    Collection Time: 08/09/24 11:19 AM   Result Value Ref Range    TROPONIN I 26 (H) <20 ng/L    Narrative    Values received on females ranging between 12-15 ng/L MUST include the next serial troponin to review changes in the delta differences as the reference range for the Access II chemistry analyzer is lower than the established reference range.  URINALYSIS WITH REFLEX MICROSCOPIC AND CULTURE IF POSITIVE    Collection Time: 08/09/24 12:37 PM    Specimen: Urine, Site not specified    Narrative    The following orders were created for panel order URINALYSIS WITH REFLEX MICROSCOPIC AND CULTURE IF POSITIVE.  Procedure                               Abnormality         Status                     ---------                               -----------         ------                     URINALYSIS, MACRO/MICRO[[id number]]      Abnormal            Final result                 Please view results for these tests on the individual orders.   URINALYSIS, MACRO/MICRO    Collection Time: 08/09/24 12:37 PM   Result Value Ref Range    COLOR Yellow Light Yellow, Yellow    APPEARANCE Clear Clear    SPECIFIC GRAVITY 1.020 1.003 - 1.035    PH 6.0 4.6 - 8.0    LEUKOCYTES Negative Negative WBCs/uL    NITRITE Negative Negative    PROTEIN Negative Negative mg/dL    GLUCOSE Negative Negative mg/dL    KETONES Negative Negative mg/dL    BILIRUBIN Small (A) Negative mg/dL    BLOOD Negative Negative mg/dL    UROBILINOGEN 0.2 0.2 - 1.0 mg/dL   TROPONIN-I IN THREE HOURS    Collection Time: 08/09/24 12:58 PM   Result Value Ref Range    TROPONIN I 52 (H) <20 ng/L    Narrative    Values received on females ranging between 12-15 ng/L MUST include the next serial troponin to review changes in the delta differences as the reference  range for the Access II chemistry analyzer is lower than the established reference range.          Imaging Studies:    XR AP MOBILE CHEST   Final Result      NO ACUTE FINDINGS.         Radiologist location ID: [id number]             DNR Status:  FULL CODE: ATTEMPT RESUSCITATION/CPR    Assessment/Plan:   Active Hospital Problems    Diagnosis    Primary Problem: Chest pain    Moderate to severe aortic insufficiency    Anxiety    Elevated troponin    Chronic kidney disease (CKD), active medical management without dialysis, stage 2 (mild)    Hypertension    A-fib    GERD with esophagitis    Hypercholesterolemia     Chest pain  History of moderate to severe aortic insufficiency  Elevated troponin  Atrial fibrillation  -continue Xarelto   -consult cardiology  -trend troponins  -echocardiogram  -continue aspirin , statin    Hypertension  Hyperlipidemia  Chronic kidney disease stage 2  Gastroesophageal reflux disease  Depression/anxiety  -continue home medications as appropriate    DVT/PE Prophylaxis: Xarelto     Daily Orders (  From admission, onward)      None          Anticoagulants (last 24 hours)       None               Saddie LITTIE Clonts, NP-C    This note was partially generated using MModal Fluency Direct system, and there may be some incorrect words, spellings, and punctuation that were not noted in checking the note before saving.        [1]   Allergies  Allergen Reactions    Metoclopramide  Other Adverse Reaction (Add comment)     Shakes unable to be still

## 2024-08-09 NOTE — ED Notes (Signed)
 [facility] contacted to transport patient to main campus at this time

## 2024-08-09 NOTE — ED Nurses Note (Signed)
 Patient took 2 baby aspirin  when he had the chest pain.

## 2024-08-09 NOTE — Nurses Notes (Signed)
 Notified by admitting hospitalist to place patient for telemetry bed when available, diagnosis: chest pain.

## 2024-08-09 NOTE — ED Nurses Note (Signed)
 Frequent  trips to Bathroom  denies chest pain  denies dizziness   only complaint is he has frequent issues with constipations and takes  Linzess  so now having frequent BM

## 2024-08-10 ENCOUNTER — Inpatient Hospital Stay (HOSPITAL_COMMUNITY)

## 2024-08-10 ENCOUNTER — Ambulatory Visit (RURAL_HEALTH_CENTER): Payer: Self-pay | Admitting: Family Medicine

## 2024-08-10 DIAGNOSIS — I4891 Unspecified atrial fibrillation: Secondary | ICD-10-CM

## 2024-08-10 DIAGNOSIS — I351 Nonrheumatic aortic (valve) insufficiency: Secondary | ICD-10-CM

## 2024-08-10 DIAGNOSIS — R0789 Other chest pain: Secondary | ICD-10-CM

## 2024-08-10 DIAGNOSIS — F1010 Alcohol abuse, uncomplicated: Secondary | ICD-10-CM

## 2024-08-10 DIAGNOSIS — R7989 Other specified abnormal findings of blood chemistry: Secondary | ICD-10-CM

## 2024-08-10 DIAGNOSIS — I10 Essential (primary) hypertension: Secondary | ICD-10-CM

## 2024-08-10 DIAGNOSIS — Z7901 Long term (current) use of anticoagulants: Secondary | ICD-10-CM

## 2024-08-10 DIAGNOSIS — E7800 Pure hypercholesterolemia, unspecified: Secondary | ICD-10-CM

## 2024-08-10 LAB — CBC WITH DIFF
BASOPHIL #: 0 x10ˆ3/uL (ref 0.00–0.10)
BASOPHIL %: 1 % (ref 0–1)
EOSINOPHIL #: 0.1 x10ˆ3/uL (ref 0.00–0.60)
EOSINOPHIL %: 2 % (ref 1–8)
HCT: 38.3 % (ref 36.7–47.1)
HGB: 12.9 g/dL (ref 12.5–16.3)
LYMPHOCYTE #: 1.2 x10ˆ3/uL (ref 1.00–3.00)
LYMPHOCYTE %: 33 % (ref 15–43)
MCH: 32.3 pg (ref 23.8–33.4)
MCHC: 33.7 g/dL (ref 32.5–36.3)
MCV: 95.9 fL (ref 73.0–96.2)
MONOCYTE #: 0.2 x10ˆ3/uL — ABNORMAL LOW (ref 0.30–1.10)
MONOCYTE %: 7 % (ref 6–14)
MPV: 8.4 fL (ref 7.4–11.4)
NEUTROPHIL #: 2.1 x10ˆ3/uL (ref 1.85–7.84)
NEUTROPHIL %: 58 % (ref 44–74)
PLATELETS: 161 x10ˆ3/uL (ref 140–440)
RBC: 3.99 x10ˆ6/uL — ABNORMAL LOW (ref 4.06–5.63)
RDW: 14.1 % (ref 12.1–16.2)
WBC: 3.6 x10ˆ3/uL (ref 3.6–10.2)

## 2024-08-10 LAB — COMPREHENSIVE METABOLIC PANEL, NON-FASTING
ALBUMIN/GLOBULIN RATIO: 1.8 — ABNORMAL HIGH (ref 0.8–1.4)
ALBUMIN: 3.5 g/dL (ref 3.5–5.7)
ALKALINE PHOSPHATASE: 34 U/L (ref 34–104)
ALT (SGPT): 11 U/L (ref 7–52)
ANION GAP: 4 mmol/L (ref 4–13)
AST (SGOT): 17 U/L (ref 13–39)
BILIRUBIN TOTAL: 0.4 mg/dL (ref 0.3–1.0)
BUN/CREA RATIO: 15 (ref 6–22)
BUN: 16 mg/dL (ref 7–25)
CALCIUM, CORRECTED: 9.1 mg/dL (ref 8.9–10.8)
CALCIUM: 8.7 mg/dL (ref 8.6–10.3)
CHLORIDE: 113 mmol/L — ABNORMAL HIGH (ref 98–107)
CO2 TOTAL: 25 mmol/L (ref 21–31)
CREATININE: 1.07 mg/dL (ref 0.60–1.30)
ESTIMATED GFR: 73 mL/min/1.73mˆ2 (ref >59)
GLOBULIN: 2 (ref 2.0–3.5)
GLUCOSE: 97 mg/dL (ref 74–109)
OSMOLALITY, CALCULATED: 284 mosm/kg (ref 270–290)
POTASSIUM: 4.3 mmol/L (ref 3.5–5.1)
PROTEIN TOTAL: 5.5 g/dL — ABNORMAL LOW (ref 6.4–8.9)
SODIUM: 142 mmol/L (ref 136–145)

## 2024-08-10 LAB — TRANSTHORACIC ECHOCARDIOGRAM - ADULT: EF MEASUREMENT VALUE: 71.2

## 2024-08-10 LAB — MAGNESIUM: MAGNESIUM: 2.1 mg/dL (ref 1.9–2.7)

## 2024-08-10 LAB — TROPONIN-I
TROPONIN I: 24 ng/L — ABNORMAL HIGH (ref <20)
TROPONIN I: 21 ng/L — ABNORMAL HIGH (ref <20)

## 2024-08-10 MED ORDER — SODIUM CHLORIDE 0.9 % INJECTION SOLUTION
2.0000 mL | Freq: Once | INTRAVENOUS | Status: DC | PRN
Start: 2024-08-10 — End: 2024-08-10
  Administered 2024-08-10: 4 mL via INTRAVENOUS

## 2024-08-10 MED ORDER — DILTIAZEM CD 240 MG CAPSULE,EXTENDED RELEASE 24 HR
240.0000 mg | ORAL_CAPSULE | Freq: Every morning | ORAL | 0 refills | Status: AC
Start: 2024-08-10 — End: 2024-10-04

## 2024-08-10 NOTE — Nurses Notes (Signed)
 Confirmed with K. Malachy, NP that patient is cleared for discharge. Primary nurse made aware.

## 2024-08-10 NOTE — ED Notes (Signed)
 [facility] - Emergency Department  Peer Recovery Coach Assessment    Initial Evaluation  Referred by:: Nurse  Location of Evaluation: Inpatient/Obs Unit  How many times in the last 12 months have you been to the ED?: 1  Have you ever served or are you currently serving in the Armed Forces?: No             Substance Use History  Patient current substance use status: Admission Alert to [facility]A.  Patient drinks 1-2 beers on most nights. Has never had any withdrawal symptoms. Does not feels alcohol is an issue. NIAAA safe alcohol guidelines, harm reduction and long term use shared with patient.    Prior treatment history?: No    Currently enrolled in substance use program?: No    Within the last 30 days, what substances has the patient used?: Alcohol  Patient's age at first substance use?: 21-25  Drug route of administration: Oral    Has the patient ever had sustained abstinence?: No              Family, Social, Home & Safety History  Marital Status: Widowed            Need to improve relationships with family?: No    Social network: Immediate family, Non-substance using peers/friends/other    Current living situation: Independent  Any help needed with the following?: None  Contact phone number for the patient: [phone]       Has the patient had any legal issues within the past 30 days?: None         Employment  Current employment status: Other    Needs vocational training?: No  Needs assistance with job search?: No    Engagement  Readiness ruler: 1  Summary of assessment priority areas: Other    Brief Intervention  Discussed plan to reduce/quit substance use?: Yes  Discussed willingness to enter treatment?: No  Indicated patient's stage of change:: 1 - Precontemplation    Patient seen by Peer Recovery Coach and is a candidate for buprenorphine administration in the ED. Patient needs assessment for bup treatment.: No    Plan  Was the patient referred to treatment?: No    Was patient referred to  physician for Buprenorphine Assessment in the ED?: No    Did patient receive Narcan in the ED?: No    Plan: Additional Comments: Admission Alert to [facility]. Patient drinks 1-2 beers on most nights. Has never had any withdrawal symptoms. Does not feels alcohol is an issue. NIAAA safe alcohol guidelines, harm reduction and long term use shared with patient.    Follow-up           Need for additional follow-up?: No       Suzen Satchel, Peer Recovery Coach 08/10/2024 09:23

## 2024-08-10 NOTE — Consults (Signed)
 [facility]   General Cardiology Consult    Corion, Sherrod, 73 y.o. male  Encounter Start Date:  08/09/2024  Inpatient Admission Date: 08/09/2024   Date of Service: 08/10/2024  Date of Birth:  [birth year]  PCP:  Jhonny Romano, DO    Information Obtained from: patient and history reviewed via medical record  Chief Complaint:  chest pain  Reason for Consult: elevated troponin    Impression/Recommendation:  Atypical chest pain  Troponin elevated, mild, type II  HTN  Hx Aortic insufficiency  Hx Afib on Xarelto   ETOH abuse    Troponin elevation mild, type II.  Continue home medications and resume diltiazem .  F/u with Dr. Sheralyn at discharge.         HPI:  The patient has a past medical history significant for aortic insufficiency, atrial fibrillation on Xarelto , hypertension, hyperlipidemia, gastroesophageal reflux disease who presented with complaints of chest pain that occurred yesterday morning, relieved after 1 nitroglycerin  and aspirin .  He also had some palpitations, but this persisted.  He was out of his Cardizem  for a couple days prior to this.  He also admits to drinking daily, usually a couple beers in the evenings, but had stopped recently.  He denies any withdrawal symptoms.  He had mild troponin elevation 13, 26, 52, 35, 24, 21.  Chest x-ray negative.  EKG showed sinus bradycardia with PACs, no acute ischemic changes.  Repeat echo pending.  He did have a stress test April 2024 unremarkable.      Echo:  Echo in May 2024 showed preserved EF with moderate to severe AI- patient had TEE to evaluate AI in ROA 5/24 which showed moderate aortic regurgitation, stable.    Cardiologist: Dr. Sheralyn     Assessment:  Active Hospital Problems    Diagnosis    Primary Problem: Chest pain    Moderate to severe aortic insufficiency    Anxiety    Elevated troponin    Chronic kidney disease (CKD), active medical management without dialysis, stage 2 (mild)    Hypertension    A-fib    GERD with esophagitis     Hypercholesterolemia      Past Medical History:   Diagnosis Date    Anticoagulant long-term use     Anxiety     Atrial fibrillation     GERD (gastroesophageal reflux disease)     Hip fracture     right hip no surgery needed    Hypercholesterolemia     Hypertension     Migraine     Rectal bleeding 07/29/2022    S/P rotator cuff repair 07/12/2012    Wears glasses          Past Surgical History:   Procedure Laterality Date    KNEE ARTHROSCOPY Left          Social History     Socioeconomic History    Marital status: Widowed     Spouse name: Not on file    Number of children: 2    Years of education: 89    Highest education level: Not on file   Occupational History    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   Tobacco Use    Smoking status: Never    Smokeless tobacco: Never   Vaping Use    Vaping status: Never Used   Substance and Sexual Activity    Alcohol use: Yes     Alcohol/week: 14.0 standard drinks of alcohol     Types: 14 Cans of beer per  week     Comment: per week    Drug use: Never    Sexual activity: Yes     Partners: Female     Birth control/protection: None   Other Topics Concern    Not on file   Social History Narrative    Not on file     Social Determinants of Health     Financial Resource Strain: Low Risk (03/16/2023)    Financial Resource Strain     SDOH Financial: No   Transportation Needs: Low Risk (03/16/2023)    Transportation Needs     SDOH Transportation: No   Social Connections: Low Risk (08/09/2024)    Social Connections     SDOH Social Isolation: 5 or more times a week   Intimate Partner Violence: Low Risk (07/30/2022)    Intimate Partner Violence     SDOH Domestic Violence: No   Housing Stability: Low Risk (03/16/2023)    Housing Stability     SDOH Housing Situation: I have housing.     SDOH Housing Worry: No      Family Medical History:       Problem Relation (Age of Onset)    Atrial fibrillation Father    Cancer Father    No Known Problems Mother, Sister, Brother, Half-Sister, Half-Brother, Maternal Aunt,  Maternal Uncle, Paternal Aunt, Paternal Uncle, Maternal Grandmother, Maternal Grandfather, Paternal Grandmother, Paternal Grandfather, Daughter, Son             Prior to Admission Medications:  Medications Prior to Admission       Prescriptions    aspirin  (ECOTRIN) 81 mg Oral Tablet, Delayed Release (E.C.)    Take 1 Tablet (81 mg total) by mouth Daily    clonazePAM  (KLONOPIN ) 1 mg Oral Tablet    Take 1 Tablet (1 mg total) by mouth Twice daily Indications: Anxiety and sleep aid.    dicyclomine  (BENTYL ) 10 mg Oral Capsule    Take 1 Capsule (10 mg total) by mouth Three times a day as needed    linaCLOtide  (LINZESS ) 145 mcg Oral Capsule    Take 1 Capsule (145 mcg total) by mouth Every morning Indications: irritable bowel syndrome with constipation    lisinopriL  (PRINIVIL ) 20 mg Oral Tablet    Take 1 Tablet (20 mg total) by mouth Daily    montelukast  (SINGULAIR ) 10 mg Oral Tablet    Take 1 Tablet (10 mg total) by mouth Once per day as needed    NALOXONE 4 MG/ACTUATION NASAL SPRAY - ED TO GO    1 Spray by INTRANASAL route Every 2 minutes as needed for actual or suspected opioid overdose. Call 911 if used.    nitroGLYCERIN  (NITROSTAT ) 0.4 mg Sublingual Tablet, Sublingual    Place 1 Tablet (0.4 mg total) under the tongue Every 5 minutes as needed for Chest pain for 3 doses over 15 minutes    pantoprazole  (PROTONIX ) 40 mg Oral Tablet, Delayed Release (E.C.)    Take 1 Tablet (40 mg total) by mouth Daily    rimegepant (NURTEC ODT ) 75 mg Oral Tablet, Rapid Dissolve    Place 1 Tablet (75 mg total) under the tongue Once per day as needed for Migraine    rivaroxaban  (XARELTO ) 20 mg Oral Tablet    Take 1 Tablet (20 mg total) by mouth Daily Indications: treatment to prevent blood clots in chronic atrial fibrillation    rosuvastatin  (CRESTOR ) 10 mg Oral Tablet    Take 1 Tablet (10 mg total) by mouth Every evening  Sildenafil  (VIAGRA ) 100 mg Oral Tablet    Take 1 Tablet (100 mg total) by mouth Every 24 hours as needed Indications:  the inability to have an erection    solifenacin  (VESICARE ) 10 mg Oral Tablet    Take 1 Tablet (10 mg total) by mouth Daily    sucralfate  (CARAFATE ) 1 gram Oral Tablet    Take 1 Tablet (1 g total) by mouth Every 6 hours    tamsulosin  (FLOMAX ) 0.4 mg Oral Capsule    Take 1 Capsule (0.4 mg total) by mouth Every evening after dinner    terbinafine  HCL (LAMISIL ) 250 mg Oral Tablet    Take 1 Tablet (250 mg total) by mouth Daily    dilTIAZem  (CARDIZEM  CD) 240 mg Oral Capsule, Sust. Release 24 hr    Take 1 Capsule (240 mg total) by mouth Every morning Do not take if sbp<115 or heart rate<60          Allergies[1]  ROS: Other than ROS in the HPI, all other systems were negative.   Exam:  Temperature: (!) 35.9 C (96.6 F)  Heart Rate: 60  BP (Non-Invasive): (!) 121/51  Respiratory Rate: 18  SpO2: 96 %  General: No acute distress and appears stated age.    HEENT: Head normocephalic, atraumatic.  Mucouse membranes moist.    Neck: No JVD, no carotid bruit.    Lungs: Clear to auscultation bilaterally.    Cardiovascular: Regular rate and rhythm, normal S1-S2 without murmur, no gallop, or rub.    Abdomen: Soft, non-tender and bowel sounds normal.    Extremities: No edema.   Skin: Skin warm and dry.    Neurologic: Alert and oriented x3.  Psych: Mood and affect congruent for age and gender     Patient seen and examined by myself and Dr. Warrick.  Assessment and plan discussed and agreed upon as documented.       Leandrew JINNY Batty, APRN,FNP-BC 08/10/2024 11:39    The patient was seen by me and examined as part of a shared service with the mid-level provider. The patient was clinically evaluated, and all pertinent lab results and imaging were reviewed. I reviewed the mid-level provider note and made any substantive changes necessary. I agree with the mid-level provider note, which reflects my medical decision making.     Dr. Ruel Von Warrick, MD  Cardiologist and Cardiac Electrophysiologist  [facility]  [facility],  [place], [address]             [1]   Allergies  Allergen Reactions    Metoclopramide  Other Adverse Reaction (Add comment)     Shakes unable to be still

## 2024-08-10 NOTE — Discharge Summary (Addendum)
 [facility]  DISCHARGE SUMMARY    PATIENT NAME:  Ricky Grimes, Ricky Grimes  MRN:  [id number]  DOB:  [birth year]    ENCOUNTER DATE:  08/09/2024  INPATIENT ADMISSION DATE: 08/09/2024  DISCHARGE DATE:  08/10/2024    ATTENDING PHYSICIAN: Teresa Lloyd, DO  SERVICE: PRN HOSPITALIST 4  PRIMARY CARE PHYSICIAN: Jhonny Romano, DO       No lay caregiver identified.    PRIMARY DISCHARGE DIAGNOSIS: Chest pain  Active Hospital Problems    Diagnosis Date Noted    Principal Problem: Chest pain [R07.9] 08/09/2024    Moderate to severe aortic insufficiency [I35.1] 08/09/2024    Anxiety [F41.9]     Elevated troponin [R79.89] 03/16/2023    Chronic kidney disease (CKD), active medical management without dialysis, stage 2 (mild) [N18.2] 12/22/2022    Hypertension [I10] 09/25/2022    A-fib [I48.91] 09/25/2022    GERD with esophagitis [K21.00] 09/25/2022    Hypercholesterolemia [E78.00] 09/25/2022      Resolved Hospital Problems   No resolved problems to display.     Active Non-Hospital Problems    Diagnosis Date Noted    Murmur 03/16/2023    Sinusitis 12/22/2022    Hip pain, right 12/22/2022    Overweight 12/22/2022    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    Vitamin D  deficiency 09/25/2022    Vitamin B 12 deficiency 09/25/2022    Urinary frequency 09/25/2022    Allergic rhinitis 09/25/2022    Onychomycosis 09/25/2022    Prostate cancer screening 09/25/2022    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    Sleeping difficulties 09/25/2022    Migraines 09/25/2022    Fever blister 09/25/2022    Neck pain, chronic 09/25/2022    Chronic anticoagulation 07/29/2022             Current Discharge Medication List        CONTINUE these medications - NO CHANGES were made during your visit.        Details   aspirin  81 mg Tablet, Delayed Release (E.C.)  Commonly known as: ECOTRIN   Take 1 Tablet (81 mg total) by mouth Daily  Refills: 0     clonazePAM  1 mg Tablet  Commonly known as: klonoPIN    1 mg, Oral, 2 TIMES DAILY  Qty:  180 Tablet  Refills: 3     dicyclomine  10 mg Capsule  Commonly known as: BENTYL    10 mg, Oral, 3 TIMES DAILY PRN  Qty: 30 Capsule  Refills: 3     dilTIAZem  240 mg Capsule, Sust. Release 24 hr  Commonly known as: CARDIZEM  CD   240 mg, Oral, EVERY MORNING, Do not take if sbp<115 or heart rate<60  Qty: 30 Capsule  Refills: 0     linaCLOtide  145 mcg Capsule  Commonly known as: Linzess    145 mcg, Oral, EVERY MORNING  Qty: 90 Capsule  Refills: 3     lisinopriL  20 mg Tablet  Commonly known as: PRINIVIL    20 mg, Oral, Daily  Qty: 90 Tablet  Refills: 3     montelukast  10 mg Tablet  Commonly known as: SINGULAIR    10 mg, Oral, DAILY PRN  Qty: 90 Tablet  Refills: 3     naloxone 4 mg/actuation Spray, Non-Aerosol  Commonly known as: NARCAN   1 Spray, EVERY 2 MIN PRN  Refills: 0     nitroGLYCERIN  0.4 mg Tablet, Sublingual  Commonly known as: NITROSTAT    0.4 mg, EVERY 5 MIN PRN  Refills: 0  Nurtec ODT  75 mg Tablet, Rapid Dissolve  Generic drug: rimegepant   75 mg, Sublingual, DAILY PRN  Qty: 8 Tablet  Refills: 3     pantoprazole  40 mg Tablet, Delayed Release (E.C.)  Commonly known as: PROTONIX    40 mg, Oral, Daily  Qty: 90 Tablet  Refills: 1     rivaroxaban  20 mg Tablet  Commonly known as: XARELTO    20 mg, Oral, Daily  Qty: 90 Tablet  Refills: 3     rosuvastatin  10 mg Tablet  Commonly known as: CRESTOR    10 mg, Oral, EVERY EVENING  Qty: 90 Tablet  Refills: 3     Sildenafil  100 mg Tablet  Commonly known as: VIAGRA    100 mg, Oral, EVERY 24 HOURS PRN  Qty: 20 Tablet  Refills: 1     solifenacin  10 mg Tablet  Commonly known as: VESICARE    10 mg, Oral, Daily  Qty: 90 Tablet  Refills: 3     sucralfate  1 gram Tablet  Commonly known as: CARAFATE    1 g, Oral, EVERY 6 HOURS (SCHEDULED)  Qty: 120 Tablet  Refills: 5     tamsulosin  0.4 mg Capsule  Commonly known as: FLOMAX    0.4 mg, Oral, EVERY EVENING AFTER DINNER  Qty: 90 Capsule  Refills: 3     terbinafine  HCL 250 mg Tablet  Commonly known as: LAMISIL    250 mg, Oral, Daily  Qty: 90  Tablet  Refills: 0            Discharge med list refreshed?  YES     Allergies[1]  HOSPITAL PROCEDURE(S):   No orders of the defined types were placed in this encounter.      REASON FOR HOSPITALIZATION AND HOSPITAL COURSE   BRIEF HPI:  This is a 73 y.o., male admitted for chest pain  BRIEF HOSPITAL NARRATIVE:     Ricky Grimes is a 73 y.o., Ricky Grimes male who presents with complaints of an episode of chest pain this morning.  Pain was resolved with 1 nitroglycerin .  He denied diaphoresis or dyspnea but has had some palpitations.  Patient states that he has been taking his medications, however he states he accidentally left his Cardizem  at someone's house and has not had it for a couple of days.  He does have atrial fibrillation has been taking his Xarelto  and all other medications.  Patient states that he does drink 1-2 beers most nights, however he has never had any withdrawal symptoms and has stopped abruptly, multiple times without any issues.  Initial troponin was 13, repeat 26 and 3rd troponin was 52.  EKG shows no ST-T abnormalities.  Echocardiogram 1 year ago showed severe aortic insufficiency, patient states that he was referred to [facility] to a cardiologist and states he was told that he has moderate aortic insufficiency and is stable.   Cardiology consulted and states patient stable for discharge after echocardiogram.  Patient has not had any chest pain since admission.  Patient was discharged home on 08/10/2024 and instructed to follow up with Cardiology and his PCP.    Addendum:  Echo this visit showed mod aortic regurg. Recommended TEE for further eval. Dr. Warrick states can be done in outpt follow up.    Joane Pizza, DO        Physical Exam  HENT:      Mouth/Throat:      Mouth: Mucous membranes are moist.   Cardiovascular:      Rate and Rhythm: Normal rate and regular rhythm.  Pulses: Normal pulses.      Heart sounds: Normal heart sounds.   Pulmonary:      Effort: Pulmonary effort is normal.       Breath sounds: Normal breath sounds.   Abdominal:      General: Bowel sounds are normal.      Palpations: Abdomen is soft.      Tenderness: There is no abdominal tenderness.   Musculoskeletal:      Cervical back: Neck supple.      Right lower leg: 1+ Edema present.      Left lower leg: 1+ Edema present.   Skin:     General: Skin is warm and dry.      Capillary Refill: Capillary refill takes less than 2 seconds.   Neurological:      Mental Status: He is alert.     TRANSITION/POST DISCHARGE CARE/PENDING TESTS/REFERRALS:     CONDITION ON DISCHARGE:  A. Ambulation: Full ambulation  B. Self-care Ability: Complete  C. Cognitive Status Alert  D. Code status at discharge:       LINES/DRAINS/WOUNDS AT DISCHARGE:   Patient Lines/Drains/Airways Status       Active Line / Dialysis Catheter / Dialysis Graft / Drain / Airway / Wound       Name Placement date Placement time Site Days    Peripheral IV Left;Posterior Dorsal Metacarpals  (top of hand) 08/09/24  --  -- 1                    DISCHARGE DISPOSITION:  Home discharge  DISCHARGE INSTRUCTIONS:  Post-Discharge Follow Up Appointments       Thursday Aug 17, 2024    Hospital Follow Up with Chesley Million, DO at  1:00 PM    Follow up with Chesley Million, DO    Phone: [phone]    Where: [facility]      Wednesday Oct 04, 2024    Return Patient Visit with Chesley Million, DO at 11:15 AM      Family Medicine, [facility] Medicine [facility]  [facility] [place]   [address]  [place] [address]  [phone]          No discharge procedures on file.       Saddie LITTIE Clonts, NP-C    Copies sent to Care Team         Relationship Specialty Notifications Start End    Chesley Million, [place] PCP - General FAMILY MEDICINE Yes. All results, Admissions 04/01/22     Phone: [phone] Fax: [phone]         [address] [place] [address]            Referring providers can utilize [email or web address]  to access their referred [facility] Medicine patient's information.                               [1]   Allergies  Allergen Reactions    Metoclopramide  Other Adverse Reaction (Add comment)     Shakes unable to be still

## 2024-08-11 ENCOUNTER — Telehealth (RURAL_HEALTH_CENTER): Payer: Self-pay | Admitting: Family Medicine

## 2024-08-11 NOTE — Telephone Encounter (Signed)
-----   Message from [place] sent at 08/10/2024  9:41 AM EDT -----  Regarding: TOC for Friday 9/12  [facility]  Discharge:  08/10/24  Hospital f/u:  9/18 @ 1;00

## 2024-08-13 ENCOUNTER — Other Ambulatory Visit: Payer: Self-pay

## 2024-08-13 ENCOUNTER — Encounter (HOSPITAL_BASED_OUTPATIENT_CLINIC_OR_DEPARTMENT_OTHER): Payer: Self-pay

## 2024-08-13 ENCOUNTER — Emergency Department (HOSPITAL_BASED_OUTPATIENT_CLINIC_OR_DEPARTMENT_OTHER)

## 2024-08-13 ENCOUNTER — Emergency Department
Admission: EM | Admit: 2024-08-13 | Discharge: 2024-08-14 | Disposition: A | Discharge status: Home / Self Care | Source: Home / Self Care | Attending: Emergency Medicine | Admitting: Emergency Medicine

## 2024-08-13 DIAGNOSIS — R079 Chest pain, unspecified: Secondary | ICD-10-CM

## 2024-08-13 DIAGNOSIS — R0789 Other chest pain: Secondary | ICD-10-CM | POA: Insufficient documentation

## 2024-08-13 LAB — COMPREHENSIVE METABOLIC PANEL, NON-FASTING
ALBUMIN/GLOBULIN RATIO: 1.3 (ref 0.8–1.4)
ALBUMIN: 4.1 g/dL (ref 3.4–5.0)
ALKALINE PHOSPHATASE: 56 U/L (ref 46–116)
ALT (SGPT): 26 U/L (ref <=78)
ANION GAP: 14 mmol/L — ABNORMAL HIGH (ref 4–13)
AST (SGOT): 29 U/L (ref 15–37)
BILIRUBIN TOTAL: 0.3 mg/dL (ref 0.2–1.0)
BUN/CREA RATIO: 13
BUN: 19 mg/dL — ABNORMAL HIGH (ref 7–18)
CALCIUM, CORRECTED: 9.8 mg/dL
CALCIUM: 9.9 mg/dL (ref 8.5–10.1)
CHLORIDE: 107 mmol/L (ref 98–107)
CO2 TOTAL: 23 mmol/L (ref 21–32)
CREATININE: 1.41 mg/dL — ABNORMAL HIGH (ref 0.70–1.30)
ESTIMATED GFR: 52 mL/min/1.73mˆ2 — ABNORMAL LOW (ref >59)
GLOBULIN: 3.1
GLUCOSE: 84 mg/dL (ref 74–106)
OSMOLALITY, CALCULATED: 288 mosm/kg (ref 270–290)
POTASSIUM: 3.7 mmol/L (ref 3.5–5.1)
PROTEIN TOTAL: 7.2 g/dL (ref 6.4–8.2)
SODIUM: 144 mmol/L (ref 136–145)

## 2024-08-13 LAB — CBC WITH DIFF
BASOPHIL #: 0.01 x10ˆ3/uL (ref 0.00–0.10)
BASOPHIL %: 0 % (ref 0–1)
EOSINOPHIL #: 0.07 x10ˆ3/uL (ref 0.00–0.60)
EOSINOPHIL %: 2 % (ref 1–8)
HCT: 39.5 % (ref 36.7–47.1)
HGB: 13.4 g/dL (ref 12.5–16.3)
LYMPHOCYTE #: 1.36 x10ˆ3/uL (ref 1.00–3.00)
LYMPHOCYTE %: 30 % (ref 15–43)
MCH: 33.4 pg (ref 23.8–33.4)
MCHC: 33.9 g/dL (ref 32.5–36.3)
MCV: 98.4 fL — ABNORMAL HIGH (ref 73.0–96.2)
MONOCYTE #: 0.31 x10ˆ3/uL (ref 0.30–1.00)
MONOCYTE %: 7 % (ref 6–14)
MPV: 8 fL (ref 7.4–11.4)
NEUTROPHIL #: 2.8 x10ˆ3/uL (ref 1.85–7.84)
NEUTROPHIL %: 62 % (ref 44–74)
PLATELETS: 191 x10ˆ3/uL (ref 140–440)
RBC: 4.01 x10ˆ6/uL — ABNORMAL LOW (ref 4.06–5.63)
RDW: 15.6 % (ref 12.1–16.2)
WBC: 4.6 x10ˆ3/uL (ref 3.6–10.2)

## 2024-08-13 LAB — TROPONIN-I: TROPONIN I: 18 ng/L (ref <20)

## 2024-08-13 LAB — PT/INR
INR: 1.56 — ABNORMAL HIGH (ref 0.84–1.10)
PROTHROMBIN TIME: 17.5 s — ABNORMAL HIGH (ref 9.8–12.7)

## 2024-08-13 LAB — MAGNESIUM: MAGNESIUM: 1.9 mg/dL (ref 1.8–2.4)

## 2024-08-13 LAB — CREATINE KINASE (CK), TOTAL, SERUM OR PLASMA: CREATINE KINASE: 242 U/L (ref 39–308)

## 2024-08-13 LAB — ETHANOL, SERUM/PLASMA: ETHANOL: 29 mg/dL — ABNORMAL HIGH (ref <=3)

## 2024-08-13 MED ORDER — NITROGLYCERIN 2 % TRANSDERMAL OINTMENT - PACKET
TOPICAL_OINTMENT | TRANSDERMAL | Status: AC
Start: 2024-08-13 — End: 2024-08-13
  Filled 2024-08-13: qty 1

## 2024-08-13 MED ORDER — PANTOPRAZOLE 40 MG INTRAVENOUS SOLUTION
INTRAVENOUS | Status: AC
Start: 2024-08-13 — End: 2024-08-13
  Filled 2024-08-13: qty 10

## 2024-08-13 MED ORDER — PANTOPRAZOLE 40 MG INTRAVENOUS SOLUTION
40.0000 mg | INTRAVENOUS | Status: AC
Start: 2024-08-13 — End: 2024-08-13
  Administered 2024-08-13: 40 mg via INTRAVENOUS

## 2024-08-13 MED ORDER — LACTATED RINGERS IV BOLUS
1000.0000 mL | INJECTION | Status: AC
Start: 2024-08-13 — End: 2024-08-13
  Administered 2024-08-13: 1000 mL via INTRAVENOUS
  Administered 2024-08-13: 0 mL via INTRAVENOUS

## 2024-08-13 MED ORDER — NITROGLYCERIN 2 % TRANSDERMAL OINTMENT - PACKET
0.5000 [in_us] | TOPICAL_OINTMENT | TRANSDERMAL | Status: AC
Start: 2024-08-13 — End: 2024-08-13
  Administered 2024-08-13: 0.5 [in_us] via TOPICAL

## 2024-08-13 NOTE — ED Provider Notes (Addendum)
 [facility] emergency department         HISTORY OF PRESENT ILLNESS     Date:  08/13/2024  Patient's Name:  Ricky Grimes  Date of Birth:  [birth year]    Patient is a 73 year old presenting to the emergency room complaining of midsternal chest pressure onset about an hour prior to coming to the ER.  Patient states he was sitting at home when the chest pressure started nonradiating.  He did take aspirin  prior to coming to the ER.  Recently patient has had admission to inpatient at [facility] for chest pain.  He denies any nausea or vomiting he denies any shortness of breath        Review of Systems     Review of Systems   Constitutional: Negative.    HENT: Negative.     Eyes: Negative.    Respiratory:  Positive for chest tightness.    Cardiovascular:  Positive for chest pain.   Endocrine: Negative.    Musculoskeletal: Negative.    Allergic/Immunologic: Negative.    Neurological: Negative.    Hematological: Negative.    Psychiatric/Behavioral: Negative.     All other systems reviewed and are negative.      Previous History     Past Medical History:  Past Medical History:   Diagnosis Date    Anticoagulant long-term use     Anxiety     Atrial fibrillation     GERD (gastroesophageal reflux disease)     Hip fracture     right hip no surgery needed    Hypercholesterolemia     Hypertension     Migraine     Rectal bleeding 07/29/2022    S/P rotator cuff repair 07/12/2012    Wears glasses        Past Surgical History:  Past Surgical History:   Procedure Laterality Date    Knee arthroscopy Left        Social History:  Social History[1]  Social History     Substance and Sexual Activity   Drug Use Never       Family History:  Family History   Problem Relation Age of Onset    No Known Problems Mother     Atrial fibrillation Father     Cancer Father     No Known Problems Sister     No Known Problems Brother     No Known Problems Half-Sister     No Known Problems  Half-Brother     No Known Problems Maternal Aunt     No Known Problems Maternal Uncle     No Known Problems Paternal Aunt     No Known Problems Paternal Uncle     No Known Problems Maternal Grandmother     No Known Problems Maternal Grandfather     No Known Problems Paternal Grandmother     No Known Problems Paternal Grandfather     No Known Problems Daughter     No Known Problems Son        Medication History:  Current Outpatient Medications   Medication Sig    aspirin  (ECOTRIN) 81 mg Oral Tablet, Delayed Release (E.C.) Take 1 Tablet (81 mg total) by mouth Daily    clonazePAM  (KLONOPIN ) 1 mg Oral Tablet Take 1 Tablet (1 mg total) by mouth Twice daily Indications: Anxiety and sleep aid.    dicyclomine  (BENTYL ) 10 mg Oral Capsule Take 1 Capsule (10 mg total) by mouth Three times  a day as needed    dilTIAZem  (CARDIZEM  CD) 240 mg Oral Capsule, Sust. Release 24 hr Take 1 Capsule (240 mg total) by mouth Every morning Do not take if sbp<115 or heart rate<60    linaCLOtide  (LINZESS ) 145 mcg Oral Capsule Take 1 Capsule (145 mcg total) by mouth Every morning Indications: irritable bowel syndrome with constipation    lisinopriL  (PRINIVIL ) 20 mg Oral Tablet Take 1 Tablet (20 mg total) by mouth Daily    montelukast  (SINGULAIR ) 10 mg Oral Tablet Take 1 Tablet (10 mg total) by mouth Once per day as needed    NALOXONE 4 MG/ACTUATION NASAL SPRAY - ED TO GO 1 Spray by INTRANASAL route Every 2 minutes as needed for actual or suspected opioid overdose. Call 911 if used.    nitroGLYCERIN  (NITROSTAT ) 0.4 mg Sublingual Tablet, Sublingual Place 1 Tablet (0.4 mg total) under the tongue Every 5 minutes as needed for Chest pain for 3 doses over 15 minutes    pantoprazole  (PROTONIX ) 40 mg Oral Tablet, Delayed Release (E.C.) Take 1 Tablet (40 mg total) by mouth Daily    rimegepant (NURTEC ODT ) 75 mg Oral Tablet, Rapid Dissolve Place 1 Tablet (75 mg total) under the tongue Once per day as needed for Migraine    rivaroxaban  (XARELTO ) 20 mg Oral  Tablet Take 1 Tablet (20 mg total) by mouth Daily Indications: treatment to prevent blood clots in chronic atrial fibrillation    rosuvastatin  (CRESTOR ) 10 mg Oral Tablet Take 1 Tablet (10 mg total) by mouth Every evening    Sildenafil  (VIAGRA ) 100 mg Oral Tablet Take 1 Tablet (100 mg total) by mouth Every 24 hours as needed Indications: the inability to have an erection    solifenacin  (VESICARE ) 10 mg Oral Tablet Take 1 Tablet (10 mg total) by mouth Daily    sucralfate  (CARAFATE ) 1 gram Oral Tablet Take 1 Tablet (1 g total) by mouth Every 6 hours    tamsulosin  (FLOMAX ) 0.4 mg Oral Capsule Take 1 Capsule (0.4 mg total) by mouth Every evening after dinner    terbinafine  HCL (LAMISIL ) 250 mg Oral Tablet Take 1 Tablet (250 mg total) by mouth Daily       Allergies:  Allergies[2]    Physical Exam     Vitals:    BP 121/62   Pulse 63   Temp 36.6 C (97.8 F)   Resp 19   Ht 1.727 m (5' 8)   Wt 79.4 kg (175 lb)   SpO2 97%   BMI 26.61 kg/m           Physical Exam  Vitals and nursing note reviewed. Exam conducted with a chaperone present.   Constitutional:       General: He is not in acute distress.     Appearance: Normal appearance. He is well-developed.   HENT:      Head: Normocephalic and atraumatic.      Right Ear: Tympanic membrane and ear canal normal.      Left Ear: Tympanic membrane and ear canal normal.      Nose: Nose normal.      Mouth/Throat:      Mouth: Mucous membranes are moist.   Eyes:      Extraocular Movements: Extraocular movements intact.      Conjunctiva/sclera: Conjunctivae normal.      Pupils: Pupils are equal, round, and reactive to light.   Cardiovascular:      Rate and Rhythm: Normal rate and regular rhythm.  Pulses: Normal pulses.      Heart sounds: No murmur heard.  Pulmonary:      Effort: Pulmonary effort is normal. No respiratory distress.      Breath sounds: Normal breath sounds.   Abdominal:      General: Bowel sounds are normal.      Palpations: Abdomen is soft.      Tenderness:  There is no abdominal tenderness.   Musculoskeletal:         General: No swelling. Normal range of motion.      Cervical back: Normal range of motion and neck supple.   Skin:     General: Skin is warm and dry.      Capillary Refill: Capillary refill takes less than 2 seconds.   Neurological:      General: No focal deficit present.      Mental Status: He is alert and oriented to person, place, and time.   Psychiatric:         Mood and Affect: Mood normal.         Diagnostic Studies/Treatment     Medications:  Medications Ordered/Administered in the ED   LR bolus infusion 1,000 mL (0 mL Intravenous Stopped 08/13/24 2255)   pantoprazole  (PROTONIX ) 4 mg/mL injection (40 mg Intravenous Given 08/13/24 2225)   nitroGLYCERIN  (NITRO-BID ) 2 % topical ointment (0.5 Inches Apply Topically Given 08/13/24 2225)       New Prescriptions    No medications on file       Labs:    Results for orders placed or performed during the hospital encounter of 08/13/24 (from the past 12 hours)   ETHANOL, SERUM   Result Value Ref Range    ETHANOL 29 (H) <=3 mg/dL   TROPONIN NOW   Result Value Ref Range    TROPONIN I 18 <20 ng/L   COMPREHENSIVE METABOLIC PANEL, NON-FASTING   Result Value Ref Range    SODIUM 144 136 - 145 mmol/L    POTASSIUM 3.7 3.5 - 5.1 mmol/L    CHLORIDE 107 98 - 107 mmol/L    CO2 TOTAL 23 21 - 32 mmol/L    ANION GAP 14 (H) 4 - 13 mmol/L    BUN 19 (H) 7 - 18 mg/dL    CREATININE 8.58 (H) 0.70 - 1.30 mg/dL    BUN/CREA RATIO 13     ESTIMATED GFR 52 (L) >59 mL/min/1.58m^2    ALBUMIN 4.1 3.4 - 5.0 g/dL    CALCIUM 9.9 8.5 - 89.8 mg/dL    GLUCOSE 84 74 - 893 mg/dL    ALKALINE PHOSPHATASE 56 46 - 116 U/L    ALT (SGPT) 26 <=78 U/L    AST (SGOT) 29 15 - 37 U/L    BILIRUBIN TOTAL 0.3 0.2 - 1.0 mg/dL    PROTEIN TOTAL 7.2 6.4 - 8.2 g/dL    ALBUMIN/GLOBULIN RATIO 1.3 0.8 - 1.4    OSMOLALITY, CALCULATED 288 270 - 290 mOsm/kg    CALCIUM, CORRECTED 9.8 mg/dL    GLOBULIN 3.1    CREATINE KINASE (CK), TOTAL, SERUM   Result Value Ref Range    CREATINE  KINASE 242 39 - 308 U/L   MAGNESIUM    Result Value Ref Range    MAGNESIUM  1.9 1.8 - 2.4 mg/dL   PT/INR   Result Value Ref Range    PROTHROMBIN TIME 17.5 (H) 9.8 - 12.7 seconds    INR 1.56 (H) 0.84 - 1.10   CBC WITH DIFF   Result Value  Ref Range    WBC 4.6 3.6 - 10.2 x10^3/uL    RBC 4.01 (L) 4.06 - 5.63 x10^6/uL    HGB 13.4 12.5 - 16.3 g/dL    HCT 60.4 63.2 - 52.8 %    MCV 98.4 (H) 73.0 - 96.2 fL    MCH 33.4 23.8 - 33.4 pg    MCHC 33.9 32.5 - 36.3 g/dL    RDW 84.3 87.8 - 83.7 %    PLATELETS 191 140 - 440 x10^3/uL    MPV 8.0 7.4 - 11.4 fL    NEUTROPHIL % 62 44 - 74 %    LYMPHOCYTE % 30 15 - 43 %    MONOCYTE % 7 6 - 14 %    EOSINOPHIL % 2 1 - 8 %    BASOPHIL % 0 0 - 1 %    NEUTROPHIL # 2.80 1.85 - 7.84 x10^3/uL    LYMPHOCYTE # 1.36 1.00 - 3.00 x10^3/uL    MONOCYTE # 0.31 0.30 - 1.00 x10^3/uL    EOSINOPHIL # 0.07 0.00 - 0.60 x10^3/uL    BASOPHIL # 0.01 0.00 - 0.10 x10^3/uL   TROPONIN IN ONE HOUR   Result Value Ref Range    TROPONIN I 18 <20 ng/L        Radiology:  XR CHEST AP    XR CHEST AP   Final Result   No acute cardiopulmonary findings.            Radiologist location ID: [id number]             ECG:   Normal sinus rhythm normal EKG 68 beats per minute nonspecific ST-T changes            Differential diagnosis  Chest pain, hypertension coronary artery disease  Course/Disposition/Plan     Course:     ED Course as of 08/14/24 0051   Mon Aug 14, 2024   0051 Troponin negative x2 patient is chest pain-free he has follow-up appointment with cardiology in 48 hours.  Patient will be discharged home     Patient will have serial troponins rule out myocardial infarction  Disposition:    Discharged    Condition at Disposition:   Stable    Follow up:   Chesley Million, [place]  [address]  [place] [address]  [phone]    Schedule an appointment as soon as possible for a visit in 2 days  If symptoms worsen      Clinical Impression:     Clinical Impression   Chest pain, unspecified type (Primary)   Chest pain,  non-cardiac         Jesslyn Ponto, MD          [1]   Social History  Tobacco Use    Smoking status: Never    Smokeless tobacco: Never   Vaping Use    Vaping status: Never Used   Substance Use Topics    Alcohol use: Yes     Alcohol/week: 14.0 standard drinks of alcohol     Types: 14 Cans of beer per week     Comment: per week    Drug use: Never   [2]   Allergies  Allergen Reactions    Metoclopramide  Other Adverse Reaction (Add comment)     Shakes unable to be still

## 2024-08-14 ENCOUNTER — Encounter (HOSPITAL_BASED_OUTPATIENT_CLINIC_OR_DEPARTMENT_OTHER): Payer: Self-pay

## 2024-08-14 ENCOUNTER — Other Ambulatory Visit: Payer: Self-pay

## 2024-08-14 ENCOUNTER — Emergency Department
Admission: EM | Admit: 2024-08-14 | Discharge: 2024-08-14 | Disposition: A | Discharge status: Home / Self Care | Source: Home / Self Care | Attending: Emergency Medicine | Admitting: Emergency Medicine

## 2024-08-14 DIAGNOSIS — I10 Essential (primary) hypertension: Secondary | ICD-10-CM | POA: Insufficient documentation

## 2024-08-14 DIAGNOSIS — Z7901 Long term (current) use of anticoagulants: Secondary | ICD-10-CM

## 2024-08-14 LAB — ECG 12 LEAD
Atrial Rate: 68 {beats}/min
Calculated P Axis: 57 degrees
Calculated R Axis: 82 degrees
Calculated T Axis: 26 degrees
PR Interval: 192 ms
QRS Duration: 90 ms
QT Interval: 396 ms
QTC Calculation: 421 ms
Ventricular rate: 68 {beats}/min

## 2024-08-14 LAB — TROPONIN-I: TROPONIN I: 18 ng/L (ref <20)

## 2024-08-14 NOTE — ED Nurses Note (Signed)
Patient discharged home with family.  AVS reviewed with patient/care giver.  A written copy of the AVS and discharge instructions was given to the patient/care giver.  Questions sufficiently answered as needed.  Patient/care giver encouraged to follow up with PCP as indicated.  In the event of an emergency, patient/care giver instructed to call 911 or go to the nearest emergency room. Pt verbalized understanding of discharge instructions. Denies any questions or concerns at this time. No s/s of acute distress noted at this time.

## 2024-08-14 NOTE — ED Nurses Note (Signed)
 Pt denies any wants or needs at this time. Denies chest pain, denies shortness of breath. Call light within reach. No s/s of acute distress noted at this time.

## 2024-08-14 NOTE — ED Nurses Note (Signed)
 Patient complains of frontal headache with blurry vision, and dizziness. Patient stated his blood pressure at home systolic was over 200. Patient then took BP medication. Patient states he has an appointment with his PCP tomorrow morning.

## 2024-08-14 NOTE — ED Provider Notes (Addendum)
 [facility] emergency department         HISTORY OF PRESENT ILLNESS     Date:  08/14/2024  Patient's Name:  Ricky Grimes  Date of Birth:  Nov 30, 1951    Patient presents to the emergency room stating his blood pressure was elevated at home.  He stated that his systolic blood pressure was over 200 and he was having facial numbness .  This occurred about an hour prior to coming to the ER.  Patient states he took lisinopril  20 mg an hour ago and came to the ER.  Presently denies any chest pain nausea or vomiting he has no facial numbness or tingling no headaches.  He denied any blurred vision        Review of Systems     Review of Systems   Constitutional: Negative.    HENT: Negative.     Respiratory: Negative.     Cardiovascular: Negative.    Gastrointestinal: Negative.    Musculoskeletal: Negative.    Neurological: Negative.    Psychiatric/Behavioral: Negative.     All other systems reviewed and are negative.      Previous History     Past Medical History:  Past Medical History:   Diagnosis Date    Anticoagulant long-term use     Anxiety     Atrial fibrillation     GERD (gastroesophageal reflux disease)     Hip fracture     right hip no surgery needed    Hypercholesterolemia     Hypertension     Migraine     Rectal bleeding 07/29/2022    S/P rotator cuff repair 07/12/2012    Wears glasses        Past Surgical History:  Past Surgical History:   Procedure Laterality Date    Knee arthroscopy Left        Social History:  Social History[1]  Social History     Substance and Sexual Activity   Drug Use Never       Family History:  Family History   Problem Relation Age of Onset    No Known Problems Mother     Atrial fibrillation Father     Cancer Father     No Known Problems Sister     No Known Problems Brother     No Known Problems Half-Sister     No Known Problems Half-Brother     No Known Problems Maternal Aunt     No Known Problems Maternal Uncle     No Known Problems Paternal  Aunt     No Known Problems Paternal Uncle     No Known Problems Maternal Grandmother     No Known Problems Maternal Grandfather     No Known Problems Paternal Grandmother     No Known Problems Paternal Grandfather     No Known Problems Daughter     No Known Problems Son        Medication History:  Current Outpatient Medications   Medication Sig    aspirin  (ECOTRIN) 81 mg Oral Tablet, Delayed Release (E.C.) Take 1 Tablet (81 mg total) by mouth Daily    clonazePAM  (KLONOPIN ) 1 mg Oral Tablet Take 1 Tablet (1 mg total) by mouth Twice daily Indications: Anxiety and sleep aid.    dicyclomine  (BENTYL ) 10 mg Oral Capsule Take 1 Capsule (10 mg total) by mouth Three times a day as needed    dilTIAZem  (CARDIZEM  CD) 240 mg Oral Capsule,  Sust. Release 24 hr Take 1 Capsule (240 mg total) by mouth Every morning Do not take if sbp<115 or heart rate<60    linaCLOtide  (LINZESS ) 145 mcg Oral Capsule Take 1 Capsule (145 mcg total) by mouth Every morning Indications: irritable bowel syndrome with constipation    lisinopriL  (PRINIVIL ) 20 mg Oral Tablet Take 1 Tablet (20 mg total) by mouth Daily    montelukast  (SINGULAIR ) 10 mg Oral Tablet Take 1 Tablet (10 mg total) by mouth Once per day as needed    NALOXONE 4 MG/ACTUATION NASAL SPRAY - ED TO GO 1 Spray by INTRANASAL route Every 2 minutes as needed for actual or suspected opioid overdose. Call 911 if used.    nitroGLYCERIN  (NITROSTAT ) 0.4 mg Sublingual Tablet, Sublingual Place 1 Tablet (0.4 mg total) under the tongue Every 5 minutes as needed for Chest pain for 3 doses over 15 minutes    pantoprazole  (PROTONIX ) 40 mg Oral Tablet, Delayed Release (E.C.) Take 1 Tablet (40 mg total) by mouth Daily    rimegepant (NURTEC ODT ) 75 mg Oral Tablet, Rapid Dissolve Place 1 Tablet (75 mg total) under the tongue Once per day as needed for Migraine    rivaroxaban  (XARELTO ) 20 mg Oral Tablet Take 1 Tablet (20 mg total) by mouth Daily Indications: treatment to prevent blood clots in chronic atrial  fibrillation    rosuvastatin  (CRESTOR ) 10 mg Oral Tablet Take 1 Tablet (10 mg total) by mouth Every evening    Sildenafil  (VIAGRA ) 100 mg Oral Tablet Take 1 Tablet (100 mg total) by mouth Every 24 hours as needed Indications: the inability to have an erection    solifenacin  (VESICARE ) 10 mg Oral Tablet Take 1 Tablet (10 mg total) by mouth Daily    sucralfate  (CARAFATE ) 1 gram Oral Tablet Take 1 Tablet (1 g total) by mouth Every 6 hours    tamsulosin  (FLOMAX ) 0.4 mg Oral Capsule Take 1 Capsule (0.4 mg total) by mouth Every evening after dinner    terbinafine  HCL (LAMISIL ) 250 mg Oral Tablet Take 1 Tablet (250 mg total) by mouth Daily       Allergies:  Allergies[2]    Physical Exam     Vitals:    BP (!) 160/71   Pulse 76   Temp 36.6 C (97.9 F)   Resp 12   Ht 1.727 m (5' 8)   Wt 79.4 kg (175 lb)   SpO2 99%   BMI 26.61 kg/m           Physical Exam  Vitals and nursing note reviewed.   Constitutional:       General: He is not in acute distress.     Appearance: Normal appearance. He is well-developed.   HENT:      Head: Normocephalic and atraumatic.      Right Ear: Tympanic membrane, ear canal and external ear normal.      Left Ear: Tympanic membrane, ear canal and external ear normal.      Mouth/Throat:      Mouth: Mucous membranes are moist.   Eyes:      Conjunctiva/sclera: Conjunctivae normal.      Pupils: Pupils are equal, round, and reactive to light.   Cardiovascular:      Rate and Rhythm: Normal rate and regular rhythm.      Pulses: Normal pulses.      Heart sounds: No murmur heard.  Pulmonary:      Effort: Pulmonary effort is normal. No respiratory distress.  Breath sounds: Normal breath sounds.   Abdominal:      General: Bowel sounds are normal.      Palpations: Abdomen is soft.      Tenderness: There is no abdominal tenderness.   Musculoskeletal:         General: No swelling. Normal range of motion.      Cervical back: Normal range of motion and neck supple.   Skin:     General: Skin is warm and  dry.      Capillary Refill: Capillary refill takes less than 2 seconds.   Neurological:      General: No focal deficit present.      Mental Status: He is alert and oriented to person, place, and time.   Psychiatric:         Mood and Affect: Mood normal.         Diagnostic Studies/Treatment     Medications:       New Prescriptions    No medications on file       Labs:    No results found for this or any previous visit (from the past 12 hours).     Radiology:  None    No orders to display       ECG:  NONE            Differential diagnosis  Hypertension, high blood pressure established    Course/Disposition/Plan     Course:      Will observe patient in ER blood pressure trending downward  Disposition:    Discharged    Condition at Disposition:     Stable  Follow up:   Chesley Million, [place]  [address]  [place] [address]  [phone]    Schedule an appointment as soon as possible for a visit in 1 day  Follow-up on blood pressure management      Clinical Impression:     Clinical Impression   Uncontrolled hypertension (Primary)         Jesslyn Ponto, MD          [1]   Social History  Tobacco Use    Smoking status: Never    Smokeless tobacco: Never   Vaping Use    Vaping status: Never Used   Substance Use Topics    Alcohol use: Yes     Alcohol/week: 14.0 standard drinks of alcohol     Types: 14 Cans of beer per week     Comment: per week    Drug use: Never   [2]   Allergies  Allergen Reactions    Metoclopramide  Other Adverse Reaction (Add comment)     Shakes unable to be still

## 2024-08-14 NOTE — ED Nurses Note (Signed)
 Patient discharged home.  AVS reviewed with patient/care giver.  A written copy of the AVS and discharge instructions was given to the patient/care giver.  Questions sufficiently answered as needed.  Patient/care giver encouraged to follow up with PCP as indicated.  In the event of an emergency, patient/care giver instructed to call 911 or go to the nearest emergency room.

## 2024-08-14 NOTE — Discharge Instructions (Signed)
 Follow-up with your primary physician as planned for management of your blood pressure

## 2024-08-14 NOTE — ED Nurses Note (Signed)
 Pt states he had chest pain earlier this evening but states I'm not really having any chest pain right now. I felt like my blood pressure may have been up because my face felt hot so I thought i needed to come get it check out. Pt denies shortness of breath, denies chest pain at this time. Denies nausea/vomiting. Pt recently had cardiac workup and states no issues but does state he did have A-fib at one time. Pt placed on monitor. No s/s of acute distress noted at this time.

## 2024-08-15 ENCOUNTER — Ambulatory Visit (RURAL_HEALTH_CENTER): Payer: Self-pay | Attending: Family Medicine | Admitting: Family Medicine

## 2024-08-15 VITALS — BP 160/84 | HR 61 | Temp 97.7°F | Ht 68.0 in | Wt 170.0 lb

## 2024-08-15 DIAGNOSIS — J329 Chronic sinusitis, unspecified: Secondary | ICD-10-CM | POA: Insufficient documentation

## 2024-08-15 DIAGNOSIS — N183 Chronic kidney disease, stage 3 unspecified: Secondary | ICD-10-CM | POA: Insufficient documentation

## 2024-08-15 DIAGNOSIS — R350 Frequency of micturition: Secondary | ICD-10-CM | POA: Insufficient documentation

## 2024-08-15 DIAGNOSIS — I5030 Unspecified diastolic (congestive) heart failure: Secondary | ICD-10-CM | POA: Insufficient documentation

## 2024-08-15 DIAGNOSIS — I130 Hypertensive heart and chronic kidney disease with heart failure and stage 1 through stage 4 chronic kidney disease, or unspecified chronic kidney disease: Secondary | ICD-10-CM | POA: Insufficient documentation

## 2024-08-15 DIAGNOSIS — I351 Nonrheumatic aortic (valve) insufficiency: Secondary | ICD-10-CM | POA: Insufficient documentation

## 2024-08-15 DIAGNOSIS — R079 Chest pain, unspecified: Secondary | ICD-10-CM | POA: Insufficient documentation

## 2024-08-15 DIAGNOSIS — I4891 Unspecified atrial fibrillation: Secondary | ICD-10-CM | POA: Insufficient documentation

## 2024-08-15 DIAGNOSIS — I10 Essential (primary) hypertension: Secondary | ICD-10-CM

## 2024-08-15 DIAGNOSIS — Z79899 Other long term (current) drug therapy: Secondary | ICD-10-CM | POA: Insufficient documentation

## 2024-08-15 DIAGNOSIS — Z7901 Long term (current) use of anticoagulants: Secondary | ICD-10-CM | POA: Insufficient documentation

## 2024-08-15 DIAGNOSIS — Z09 Encounter for follow-up examination after completed treatment for conditions other than malignant neoplasm: Secondary | ICD-10-CM

## 2024-08-15 DIAGNOSIS — N529 Male erectile dysfunction, unspecified: Secondary | ICD-10-CM | POA: Insufficient documentation

## 2024-08-15 LAB — POCT URINE DIPSTICK
BILIRUBIN: NEGATIVE
BLOOD: NEGATIVE
GLUCOSE: NEGATIVE
KETONE: NEGATIVE
LEUKOCYTES: NEGATIVE
NITRITE: NEGATIVE
PH: 6
PROTEIN: NEGATIVE
SPECIFIC GRAVITY: 1.01
UROBILINOGEN: 0.2

## 2024-08-15 MED ORDER — EMPAGLIFLOZIN 10 MG TABLET
10.0000 mg | ORAL_TABLET | Freq: Every day | ORAL | 4 refills | Status: AC
Start: 2024-08-15 — End: ?

## 2024-08-15 MED ORDER — VALSARTAN 320 MG TABLET
320.0000 mg | ORAL_TABLET | Freq: Every day | ORAL | 4 refills | Status: DC
Start: 2024-08-15 — End: 2024-08-29

## 2024-08-15 MED ORDER — AMOXICILLIN 875 MG-POTASSIUM CLAVULANATE 125 MG TABLET
1.0000 | ORAL_TABLET | Freq: Two times a day (BID) | ORAL | 0 refills | Status: AC
Start: 2024-08-15 — End: 2024-08-25

## 2024-08-15 NOTE — Progress Notes (Signed)
 FAMILY MEDICINE, [facility] FAMILY MEDICINE [facility]  [address]  [place] [address]  Operated by [facility]  Transitional Care Management Note    Name: Ricky Grimes MRN:  [id number]   Date: 08/15/2024 Age: 73 y.o.     Chief Complaint: Hospital Follow Up [facility] follow up he concerned sight not right wt loss) and Hospital Discharge Transition       SUBJECTIVE:  Ricky Grimes is a 72 y.o. male presenting today for follow-up after being discharged. The main problem requiring admission was chest pain and moderate to severe aortic insufficiency and HTN.   He has PMH of anxiety, HTN, atrial fibrillation, GERD, hypercholesterolemia, migraine presenting for hospital follow-up for 3 hypertensive urgencies, on 9/10 with discharge on 9/11. He went back to the ED on 9/14 and 9/15 due to htn; also complains of frequent urination. On the 9/10 visit he was hospitalized one day. Today he has a headache but no blurry vision, chest pain, or shortness of breath. He is currently taking lisinopril  20 mg and diltiazem  240 daily. He has Viagra  100 mg which he uses PRN for ED.   He is still complaining of frequent urination for the past month, as he was at his last clinic visit on 9/8. Denies increased thirst, falls or head trauma, or hematuria. He says it happens more often after drinking coffee; he drinks 2 cups every morning and sometimes drinks it in the afternoon and evening. He admits to fatigue; denies appetite changes.   Lynwood Moats, OMSIV  I was personally present when the student was taking history, performing the exam, and during any medical decision-making activity.  I personally verified the history, performed the exam, and medical decision-making as edited in the student's note. I agree with the medical student's documentation.    Jhonny Romano, DO     OBJECTIVE:   BP (!) 160/84 (Site: Left Arm, Patient Position: Sitting, Cuff Size: Adult)   Pulse 61   Temp 36.5 C (97.7  F) (Tympanic)   Ht 1.727 m (5' 8)   Wt 77.1 kg (170 lb)   SpO2 98%   BMI 25.85 kg/m   Objective:  Gen: slightly anxious, sitting comfortably  HEENT: normocephalic, PERRLA, tympanic membranes visualized bilaterally with cone of light, buccal mucosa moist, Nares edematous and erythematous. no parotid gland swelling; neck supple, no thyromegaly or carotid bruits  CV: RRR, 3/6 diastolic murmur best heard at RUSB  Resp: :Lungs CTA bilaterally, no wheezes or crackles       Transition of Care Contact Information  Discharge date: Discharge Date: 08/10/2024  Transition Facility Type--Hospital (Inpatient or Observation)  Facility Name--[facility] Interactive Contact(s):  Completed Contact: 08/11/2024 10:46 AM  Contact Method(s)-- Patient/Caregiver Telephone  Clinical Staff Name/Role who contacted--Barbara RoseLPN     Data Reviewed  Medication Reconciliation completed    Echocardiogram Conclusions:  Left ventricular systolic function is normal.  Normal right ventricular systolic function.  There is moderate aortic regurgitation.     A transesophageal echocardiogram is recommended.     Findings  Left Ventricle:   Normal left ventricular size. Left ventricular systolic function is normal. Ejection Fraction is 71.2 %. No segmental/regional wall motion abnormalities identified. Grade I Diastolic  dysfunction present with normal left atrial pressure. GLS value is within normal limits.  Right Ventricle:   Normal right ventricular size. Normal right ventricular systolic function. RV systolic pressure could not be determined due to incomplete tricuspid regurgitation envelope.  Left Atrium:  The left atrium is normal in size.  Right Atrium:   The right atrium is of normal size.  Mitral Valve:   Mild mitral valve leaflet calcification. Mitral valve leaflets appear mildly thickened. There is mild mitral regurgitation.  Tricuspid Valve:   There is mild tricuspid regurgitation.  Aortic Valve:   There is sclerosis  of the aortic valve. The aortic cusps appear mildly thickened. The aortic valve is mildly calcified. The mean gradient across the aortic valve is 18.5 mmHg . There  is mild aortic stenosis. There is moderate aortic regurgitation.  Pulmonic Valve:   There is mild pulmonic regurgitation.  IVC/Hepatic Veins:   Normal IVC size with >50% inspiratory collapse (estimated RA pressure 3 mmHg).  Aorta:   The aortic root is of normal size. The ascending aorta is normal in size.  Pericardium/Pleural space:   Normal pericardium with no pericardial effusion.     Electronically signed by: MD REDELL ONEIDA BRADLEY on 08/10/2024 03:47 PM       Assessment and Plan    ICD-10-CM    1. Hospital discharge follow-up  Z09       2. Chest pain  R07.9       3. Hypertension, unspecified type  I10       4. Moderate to severe aortic insufficiency  I35.1       5. Heart failure with preserved ejection fraction, unspecified HF chronicity (CMS HCC)  I50.30       6. Urinary frequency  R35.0 POCT Urine Dipstick      7. CKD (chronic kidney disease) stage 3, GFR 30-59 ml/min  N18.30       8. A-fib  I48.91       9. Chronic anticoagulation  Z79.01       10. Sinusitis  J32.9         Other transition actions (Optional) -: Discharge documentation was reviewed    Continue diltiazem . Just back on meds this week  Stop lisinopril   Use valsartan  instead    Continue Xarelto  for a. Fib  Continue jardiance  ckd    Augmentin  for sinuses  Optimal blood pressure control is <130/<80    Wait on urinary sx until f/u with cardiology. Needs stress test or cardiac cath. He can discuss which is preferred with cardiology but he does need f/u    Jhonny Romano, DO

## 2024-08-15 NOTE — Progress Notes (Incomplete Revision)
 FAMILY MEDICINE, [facility] FAMILY MEDICINE [facility]  [address]  [place] [address]  Operated by [facility]  Transitional Care Management Note    Name: Ricky Grimes MRN:  [id number]   Date: 08/15/2024 Age: 73 y.o.     Chief Complaint: Hospital Follow Up [facility] follow up he concerned sight not right wt loss) and Hospital Discharge Transition       SUBJECTIVE:  Ricky Grimes is a 73 y.o. male presenting today for follow-up after being discharged. The main problem requiring admission was ***.   Subjective: Patient is a 73 year old male with PMH of anxiety, HTN, atrial fibrillation, GERD, hypercholesterolemia, migraine presenting for hospital follow-up for 3 hypertensive urgencies, on 9/10, 9/14, and 9/15; also complains of frequent urination. On the 9/10 visit he was hospitalized one day. Today he has a headache but no blurry vision, chest pain, or shortness of breath. He is currently taking lisinopril  20 mg and diltiazem  240 daily. He has Viagra  100 mg which he uses PRN for ED.   He is still complaining of frequent urination for the past month, as he was at his last clinic visit on 9/8. Denies increased thirst, falls or head trauma, or hematuria. He says it happens more often after drinking coffee; he drinks 2 cups every morning and sometimes drinks it in the afternoon and evening. He admits to fatigue; denies appetite changes.     OBJECTIVE:   BP (!) 160/84 (Site: Left Arm, Patient Position: Sitting, Cuff Size: Adult)   Pulse 61   Temp 36.5 C (97.7 F) (Tympanic)   Ht 1.727 m (5' 8)   Wt 77.1 kg (170 lb)   SpO2 98%   BMI 25.85 kg/m   Objective:  Gen: slightly anxious, sitting comfortably  HEENT: normocephalic, PERRLA, tympanic membranes visualized bilaterally with cone of light, buccal mucosa moist, no parotid gland swelling; neck supple, no thyromegaly or carotid bruits  CV: RRR, 3/6 diastolic murmur best heard at RUSB  Resp: :Lungs CTA bilaterally, no  wheezes or crackles       Transition of Care Contact Information  Discharge date: Discharge Date: 08/10/2024  Transition Facility Type--Hospital (Inpatient or Observation)  Facility Name--[facility] Interactive Contact(s):  Completed Contact: 08/11/2024 10:46 AM  Contact Method(s)-- Patient/Caregiver Telephone  Clinical Staff Name/Role who contacted--Barbara RoseLPN     Data Reviewed  Medication Reconciliation completed  Conclusions:  Left ventricular systolic function is normal.  Normal right ventricular systolic function.  There is moderate aortic regurgitation.     A transesophageal echocardiogram is recommended.     Findings  Left Ventricle:   Normal left ventricular size. Left ventricular systolic function is normal. Ejection Fraction is 71.2 %. No segmental/regional wall motion abnormalities identified. Grade I Diastolic  dysfunction present with normal left atrial pressure. GLS value is within normal limits.  Right Ventricle:   Normal right ventricular size. Normal right ventricular systolic function. RV systolic pressure could not be determined due to incomplete tricuspid regurgitation envelope.  Left Atrium:   The left atrium is normal in size.  Right Atrium:   The right atrium is of normal size.  Mitral Valve:   Mild mitral valve leaflet calcification. Mitral valve leaflets appear mildly thickened. There is mild mitral regurgitation.  Tricuspid Valve:   There is mild tricuspid regurgitation.  Aortic Valve:   There is sclerosis of the aortic valve. The aortic cusps appear mildly thickened. The aortic valve is mildly calcified. The mean gradient across  the aortic valve is 18.5 mmHg . There  is mild aortic stenosis. There is moderate aortic regurgitation.  Pulmonic Valve:   There is mild pulmonic regurgitation.  IVC/Hepatic Veins:   Normal IVC size with >50% inspiratory collapse (estimated RA pressure 3 mmHg).  Aorta:   The aortic root is of normal size. The ascending aorta is normal in  size.  Pericardium/Pleural space:   Normal pericardium with no pericardial effusion.     Electronically signed by: MD REDELL ONEIDA BRADLEY on 08/10/2024 03:47 PM       Assessment and Plan    ICD-10-CM    1. Urinary frequency  R35.0 POCT Urine Dipstick      2. CKD (chronic kidney disease) stage 3, GFR 30-59 ml/min  N18.30       3. A-fib  I48.91       4. Chronic anticoagulation  Z79.01       5. Heart failure with preserved ejection fraction, unspecified HF chronicity (CMS HCC)  I50.30         Other transition actions (Optional) -: Discharge documentation was reviewed    Continue diltiazem . Just back on meds this week  Stop lisinopril   Use valsartan   No follow-ups on file.    Ricky Romano, DO

## 2024-08-17 ENCOUNTER — Ambulatory Visit (RURAL_HEALTH_CENTER): Payer: Self-pay | Admitting: Family Medicine

## 2024-08-18 ENCOUNTER — Encounter (RURAL_HEALTH_CENTER): Payer: Self-pay | Admitting: Family Medicine

## 2024-08-22 ENCOUNTER — Encounter (RURAL_HEALTH_CENTER): Payer: Self-pay | Admitting: Family

## 2024-08-22 ENCOUNTER — Other Ambulatory Visit: Payer: Self-pay

## 2024-08-22 ENCOUNTER — Ambulatory Visit (RURAL_HEALTH_CENTER): Payer: Self-pay | Attending: Family | Admitting: Family

## 2024-08-22 VITALS — BP 116/54 | HR 57 | Temp 97.9°F | Resp 18 | Ht 68.0 in | Wt 171.2 lb

## 2024-08-22 DIAGNOSIS — I351 Nonrheumatic aortic (valve) insufficiency: Secondary | ICD-10-CM | POA: Insufficient documentation

## 2024-08-22 DIAGNOSIS — R5383 Other fatigue: Secondary | ICD-10-CM | POA: Insufficient documentation

## 2024-08-22 DIAGNOSIS — M1611 Unilateral primary osteoarthritis, right hip: Secondary | ICD-10-CM | POA: Insufficient documentation

## 2024-08-22 DIAGNOSIS — I4891 Unspecified atrial fibrillation: Secondary | ICD-10-CM | POA: Insufficient documentation

## 2024-08-22 DIAGNOSIS — H538 Other visual disturbances: Secondary | ICD-10-CM | POA: Insufficient documentation

## 2024-08-22 DIAGNOSIS — R0989 Other specified symptoms and signs involving the circulatory and respiratory systems: Secondary | ICD-10-CM | POA: Insufficient documentation

## 2024-08-22 DIAGNOSIS — Z7901 Long term (current) use of anticoagulants: Secondary | ICD-10-CM | POA: Insufficient documentation

## 2024-08-22 DIAGNOSIS — I129 Hypertensive chronic kidney disease with stage 1 through stage 4 chronic kidney disease, or unspecified chronic kidney disease: Secondary | ICD-10-CM | POA: Insufficient documentation

## 2024-08-22 DIAGNOSIS — R011 Cardiac murmur, unspecified: Secondary | ICD-10-CM | POA: Insufficient documentation

## 2024-08-22 DIAGNOSIS — N189 Chronic kidney disease, unspecified: Secondary | ICD-10-CM | POA: Insufficient documentation

## 2024-08-22 NOTE — Nursing Note (Signed)
 Patient here today because he has been having some high blood pressure problem that been making his eye sight go some, and having some brain fog

## 2024-08-22 NOTE — Progress Notes (Signed)
 FAMILY MEDICINE, [facility] FAMILY MEDICINE [facility]  [address]  [place] [address]  Operated by [facility]     Name: Ricky Grimes MRN:  [id number]   Date of Birth: 1951/03/16 Age: 73 y.o.   Date: 08/22/2024  Time: 13:41     Provider: Greig LITTIE Mills, FNP    Reason for visit: High Blood Pressure        History of Present Illness:   History of Present Illness  Ricky Grimes is a 73 year old male with hypertension and aortic insufficiency who presents with uncontrolled blood pressure, causing facial flushing.    He has been experiencing high blood pressure readings since starting a new medication regimen, with fluctuations reaching as high as 180/unknown and 170/unknown, particularly in the evenings. He experiences facial flushing and a sensation of heat when his blood pressure is elevated. To treat this, he has been taking additional doses of lisinopril  to manage the symptoms.    He started taking valsartan  approximately a week ago, along with diltiazem  240 mg every morning for a heart condition related to a hole in the heart. He also takes Jardiance  and an antibiotic for a sinus infection. Occasionally, he takes Advil for hip pain.    He has a history of aortic valve insufficiency and is scheduled to see a cardiologist next month. He does not closely monitor his diet, which includes high-sodium foods like cheeseburgers and fries. He recalls a recent  episode at dermatology, where he nearly passed out, took a nitroglycerin  pill, and two baby aspirins, which resolved the episode. He has a history of atrial fibrillation and has been on Xarelto , a blood thinner.    He experiences occasional blurred vision, which he attributes to either his high blood pressure or potential cataracts. He also reports feeling tired and slightly foggy-headed since starting the new medications.  He drinks coffee regularly and consumes water daily. He occasionally consumes alcohol, specifically beer,  and eats out frequently, which may contribute to high sodium intake.       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    Vitamin D  deficiency 09/25/2022    Vitamin B 12 deficiency 09/25/2022    Urinary frequency 09/25/2022    Allergic rhinitis 09/25/2022    GERD with esophagitis 09/25/2022    A-fib 09/25/2022    Onychomycosis 09/25/2022    Prostate cancer screening 09/25/2022    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    Sleeping difficulties 09/25/2022    Migraines 09/25/2022    Fever blister 09/25/2022    Neck pain, chronic 09/25/2022    Chronic anticoagulation 07/29/2022    Pain in right hand 12/18/2021    Osteoarthritis of right hip 12/11/2021    Trochanteric bursitis of right hip 12/11/2021       Historical Data    Past Medical History:  Past Medical History:   Diagnosis Date    Anticoagulant long-term use     Anxiety  Atrial fibrillation     GERD (gastroesophageal reflux disease)     Hip fracture     right hip no surgery needed    Hypercholesterolemia     Hypertension     Migraine     Rectal bleeding 07/29/2022    S/P rotator cuff repair 07/12/2012    Wears glasses      Past Surgical History:  Past Surgical History:   Procedure Laterality Date    KNEE ARTHROSCOPY Left      Allergies:  Allergies[1]  Medications:  Current Outpatient Medications   Medication Sig    amoxicillin -pot clavulanate (AUGMENTIN ) 875-125 mg Oral Tablet Take 1 Tablet by mouth Twice daily for 10 days    aspirin  (ECOTRIN) 81 mg Oral Tablet, Delayed Release (E.C.) Take 1 Tablet (81  mg total) by mouth Daily    clonazePAM  (KLONOPIN ) 1 mg Oral Tablet Take 1 Tablet (1 mg total) by mouth Twice daily Indications: Anxiety and sleep aid.    dicyclomine  (BENTYL ) 10 mg Oral Capsule Take 1 Capsule (10 mg total) by mouth Three times a day as needed    dilTIAZem  (CARDIZEM  CD) 240 mg Oral Capsule, Sust. Release 24 hr Take 1 Capsule (240 mg total) by mouth Every morning Do not take if sbp<115 or heart rate<60    empagliflozin  (JARDIANCE ) 10 mg Oral Tablet Take 1 Tablet (10 mg total) by mouth Daily    linaCLOtide  (LINZESS ) 145 mcg Oral Capsule Take 1 Capsule (145 mcg total) by mouth Every morning Indications: irritable bowel syndrome with constipation    montelukast  (SINGULAIR ) 10 mg Oral Tablet Take 1 Tablet (10 mg total) by mouth Once per day as needed    NALOXONE 4 MG/ACTUATION NASAL SPRAY - ED TO GO 1 Spray by INTRANASAL route Every 2 minutes as needed for actual or suspected opioid overdose. Call 911 if used.    nitroGLYCERIN  (NITROSTAT ) 0.4 mg Sublingual Tablet, Sublingual Place 1 Tablet (0.4 mg total) under the tongue Every 5 minutes as needed for Chest pain for 3 doses over 15 minutes    pantoprazole  (PROTONIX ) 40 mg Oral Tablet, Delayed Release (E.C.) Take 1 Tablet (40 mg total) by mouth Daily    rimegepant (NURTEC ODT ) 75 mg Oral Tablet, Rapid Dissolve Place 1 Tablet (75 mg total) under the tongue Once per day as needed for Migraine    rivaroxaban  (XARELTO ) 20 mg Oral Tablet Take 1 Tablet (20 mg total) by mouth Daily Indications: treatment to prevent blood clots in chronic atrial fibrillation    rosuvastatin  (CRESTOR ) 10 mg Oral Tablet Take 1 Tablet (10 mg total) by mouth Every evening    Sildenafil  (VIAGRA ) 100 mg Oral Tablet Take 1 Tablet (100 mg total) by mouth Every 24 hours as needed Indications: the inability to have an erection    solifenacin  (VESICARE ) 10 mg Oral Tablet Take 1 Tablet (10 mg total) by mouth Daily    sucralfate  (CARAFATE ) 1 gram Oral Tablet Take 1 Tablet (1 g total) by mouth  Every 6 hours    tamsulosin  (FLOMAX ) 0.4 mg Oral Capsule Take 1 Capsule (0.4 mg total) by mouth Every evening after dinner    terbinafine  HCL (LAMISIL ) 250 mg Oral Tablet Take 1 Tablet (250 mg total) by mouth Daily    valsartan  (DIOVAN ) 320 mg Oral Tablet Take 1 Tablet (320 mg total) by mouth Daily     Family History:  Family Medical History:       Problem Relation (Age of Onset)    Atrial fibrillation Father  Cancer Father    No Known Problems Mother, Sister, Brother, Half-Sister, Half-Brother, Maternal Aunt, Maternal Uncle, Paternal Aunt, Paternal Uncle, Maternal Grandmother, Maternal Grandfather, Paternal Grandmother, Paternal Grandfather, Daughter, Son            Social History:  Social History     Socioeconomic History    Marital status: Widowed    Number of children: 2    Years of education: 12   Occupational History    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   Tobacco Use    Smoking status: Never    Smokeless tobacco: Never   Vaping Use    Vaping status: Never Used   Substance and Sexual Activity    Alcohol use: Yes     Alcohol/week: 14.0 standard drinks of alcohol     Types: 14 Cans of beer per week     Comment: per week    Drug use: Never    Sexual activity: Yes     Partners: Female     Birth control/protection: None     Social Determinants of Health     Financial Resource Strain: Low Risk (03/16/2023)    Financial Resource Strain     SDOH Financial: No   Transportation Needs: Low Risk (03/16/2023)    Transportation Needs     SDOH Transportation: No   Social Connections: Low Risk (08/09/2024)    Social Connections     SDOH Social Isolation: 5 or more times a week   Intimate Partner Violence: Low Risk (07/30/2022)    Intimate Partner Violence     SDOH Domestic Violence: No   Housing Stability: Low Risk (03/16/2023)    Housing Stability     SDOH Housing Situation: I have housing.     SDOH Housing Worry: No           Review of Systems:  Any pertinent Review of Systems as addressed in the HPI above.    Physical Exam:  Vital  Signs:  Vitals:    08/22/24 1041   BP: (!) 116/54   Pulse: 57   Resp: 18   Temp: 36.6 C (97.9 F)   TempSrc: Temporal   SpO2: 100%   Weight: 77.7 kg (171 lb 4 oz)   Height: 1.727 m (5' 8)   BMI: 26.04     Physical Exam  Vitals reviewed.   Constitutional:       Appearance: Normal appearance.   HENT:      Head: Normocephalic and atraumatic.      Nose: Nose normal.      Mouth/Throat:      Mouth: Mucous membranes are moist.   Eyes:      General: No scleral icterus.     Conjunctiva/sclera: Conjunctivae normal.   Cardiovascular:      Rate and Rhythm: Normal rate and regular rhythm. Occasional Extrasystoles are present.     Pulses: Normal pulses.      Heart sounds: S1 normal and S2 normal. Murmur heard.      Systolic murmur is present with a grade of 4/6.   Pulmonary:      Effort: Pulmonary effort is normal.      Breath sounds: Normal breath sounds. No decreased breath sounds, wheezing, rhonchi or rales.   Abdominal:      Palpations: Abdomen is soft.   Musculoskeletal:         General: No swelling.   Skin:     General: Skin is warm.      Coloration: Skin is not cyanotic  or pale.      Nails: There is no clubbing.   Neurological:      Mental Status: He is alert and oriented to person, place, and time. Mental status is at baseline.         Results:  Lab Results   Component Value Date    VITAMIND25 28.84 (L) 07/05/2024      CBC  Diff   Lab Results   Component Value Date/Time    WBC 4.6 08/13/2024 10:08 PM    HGB 13.4 08/13/2024 10:08 PM    HCT 39.5 08/13/2024 10:08 PM    PLTCNT 191 08/13/2024 10:08 PM    RBC 4.01 (L) 08/13/2024 10:08 PM    MCV 98.4 (H) 08/13/2024 10:08 PM    MCHC 33.9 08/13/2024 10:08 PM    MCH 33.4 08/13/2024 10:08 PM    RDW 15.6 08/13/2024 10:08 PM    MPV 8.0 08/13/2024 10:08 PM    Lab Results   Component Value Date/Time    PMNS 62 08/13/2024 10:08 PM    LYMPHOCYTES 30 08/13/2024 10:08 PM    EOSINOPHIL 2 08/13/2024 10:08 PM    MONOCYTES 7 08/13/2024 10:08 PM    BASOPHILS 0 08/13/2024 10:08 PM    BASOPHILS  0.01 08/13/2024 10:08 PM    PMNABS 2.80 08/13/2024 10:08 PM    LYMPHSABS 1.36 08/13/2024 10:08 PM    EOSABS 0.07 08/13/2024 10:08 PM    MONOSABS 0.31 08/13/2024 10:08 PM           COMPREHENSIVE METABOLIC PANEL  Lab Results   Component Value Date    SODIUM 144 08/13/2024    POTASSIUM 3.7 08/13/2024    CHLORIDE 107 08/13/2024    CO2 23 08/13/2024    ANIONGAP 14 (H) 08/13/2024    BUN 19 (H) 08/13/2024    CREATININE 1.41 (H) 08/13/2024    GLUCOSENF 84 08/13/2024    GLUCOSE negative 08/15/2024    CALCIUM 9.9 08/13/2024    ALBUMIN 4.1 08/13/2024    TOTALPROTEIN 7.2 08/13/2024    ALKPHOS 56 08/13/2024    AST 29 08/13/2024    ALT 26 08/13/2024    GFR 52 (L) 08/13/2024                 Lab Results   Component Value Date    CHOLESTEROL 199 09/25/2022    TRIG 137 09/25/2022    HDLCHOL 56 09/25/2022    LDLCHOL 116 (H) 09/25/2022    VLDLCAL 27 09/25/2022    CHOLHDLRATIO 3.6 09/25/2022      Lab Results   Component Value Date    TSH 1.473 07/05/2024     Lab Results   Component Value Date    HA1C 5.5 04/02/2022            ICD-10-CM    1. Labile blood pressure  R09.89            Assessment/Plan:  Assessment & Plan  Hypertension  Recent issue with Hypertension leading to medication adjustment. Blood pressure fluctuating with episodes of facial flushing and dizziness. High sodium intake and Advil use may contribute to elevated blood pressure.  - advised to stop self medicating and discontinue lisinopril  use, which is not recommended in conjunction with valsartan ..  - continue valsartan  320 mg daily  - Educated on DASH diet and recommended sodium intake of 1500 mg/day.  - Monitor blood pressure three times daily and maintain a diary of readings and diet.  First thing in the morning before medications,  1 to 2 o'clock in the afternoon, and prior to bed, before taking nightly medications.  - Consider adding evening blood pressure medication if needed after review of diary.   - Avoid Advil and switch to Tylenol  Arthritis Strength for pain  management.    Atrial fibrillation  Atrial fibrillation managed with diltiazem  and Xarelto .  - Continue diltiazem  240 mg daily and Xarelto  20 mg daily     Chronic kidney disease  Chronic kidney disease with emphasis on avoiding NSAIDs to prevent further kidney damage.  - Use Tylenol  Arthritis Strength for pain management.  - Ensure adequate hydration with water, tea, and coffee.    Osteoarthritis of hip  Osteoarthritis of the hip causing significant pain, previously managed with Advil, which is not recommended due to hypertension and kidney disease.  - Switch to Tylenol  Arthritis Strength 650 mg, 2 tablets as needed for pain management.    Aortic valve insufficiency (moderate)  Moderate aortic valve insufficiency with no current symptoms requiring intervention.  - Continue follow-up with cardiologist next month.    Suspected cataracts  Blurred vision possibly related to high blood pressure or suspected cataracts.  - Schedule ophthalmology evaluation for cataracts.    Fatigue  Fatigue possibly related to recent medication changes and high blood pressure. Symptoms expected to improve as blood pressure stabilizes.       Reviewed hospitalization notes 08/09/2024, ER visit on 08/13/2024, ER visit on 08/14/2024, primary care visit on 08/15/2024.  Reviewed recent echo results.  Reviewed [facility] note 04/19/2023.  Verbally reviewed patient's history, current complaints, recommended intervention with Dr. Jhonny Romano      Return in about 1 week (around 08/29/2024), or Follow HTN, for Chronic Disease Management.    On the day of the encounter, a total of 60 minutes was spent on this patient encounter including review of historical information, examination, documentation and post-visit activities. The time documented excludes procedural time.     Ricky Ream L Gurveer Colucci, FNP-C     This note was created with assistance from [employer] via capture of conversational audio.  Consent was obtained from the patient prior  to recording.      Portions of this note may be dictated using voice recognition software or a dictation service. Variances in spelling and vocabulary are possible and unintentional. Not all errors are caught/corrected. Please notify the dino if any discrepancies are noted or if the meaning of any statement is not clear.          [1]   Allergies  Allergen Reactions    Metoclopramide  Other Adverse Reaction (Add comment)     Shakes unable to be still

## 2024-08-22 NOTE — Patient Instructions (Signed)
 VISIT SUMMARY:  During your visit, we discussed your uncontrolled blood pressure, facial flushing, and other related symptoms. We reviewed your current medications and lifestyle habits, and made several adjustments to help manage your conditions more effectively.    YOUR PLAN:  -HYPERTENSION: Hypertension means high blood pressure. We will discontinue your use of lisinopril  and focus on dietary changes, specifically the DASH diet with a sodium intake of 1500 mg/day. You should monitor your blood pressure three times daily and keep a diary of your readings and diet. If needed, we may add an evening blood pressure medication after reviewing your diary. Avoid using Advil and switch to Tylenol  Arthritis Strength for pain management.    -ATRIAL FIBRILLATION: Atrial fibrillation is an irregular and often rapid heart rate. Continue taking diltiazem  and Xarelto  as prescribed.    -CHRONIC KIDNEY DISEASE: Chronic kidney disease means your kidneys are damaged and can't filter blood as well as they should. Avoid NSAIDs like Advil to prevent further kidney damage and use Tylenol  Arthritis Strength for pain management. Ensure you stay hydrated with water, tea, and coffee.    -OSTEOARTHRITIS OF HIP: Osteoarthritis of the hip is a condition where the cartilage in your hip joint wears down over time. Switch from Advil to Tylenol  Arthritis Strength for pain management.    -AORTIC VALVE INSUFFICIENCY (MODERATE): Aortic valve insufficiency means your aortic valve does not close properly, causing blood to flow backward into the heart. Continue your follow-up with the cardiologist next month.    -SUSPECTED CATARACTS: Cataracts are a clouding of the normally clear lens of your eye. Schedule an evaluation with an ophthalmologist to check for cataracts.    -FATIGUE: Fatigue can be a feeling of tiredness or lack of energy. This may be related to your recent medication changes and high blood pressure. Continue taking vitamin D3 and B12  supplements, and your symptoms are expected to improve as your blood pressure stabilizes.    INSTRUCTIONS:  Please monitor your blood pressure three times daily and keep a diary of your readings and diet. Schedule an evaluation with an ophthalmologist for your suspected cataracts. Continue your follow-up with the cardiologist next month.

## 2024-08-29 ENCOUNTER — Encounter (RURAL_HEALTH_CENTER): Payer: Self-pay | Admitting: Family

## 2024-08-29 ENCOUNTER — Ambulatory Visit (RURAL_HEALTH_CENTER): Payer: Self-pay | Attending: Family | Admitting: Family

## 2024-08-29 ENCOUNTER — Other Ambulatory Visit: Payer: Self-pay

## 2024-08-29 VITALS — BP 136/52 | HR 80 | Temp 97.8°F | Resp 18 | Ht 68.0 in | Wt 172.4 lb

## 2024-08-29 DIAGNOSIS — Z7901 Long term (current) use of anticoagulants: Secondary | ICD-10-CM | POA: Insufficient documentation

## 2024-08-29 DIAGNOSIS — Z79899 Other long term (current) drug therapy: Secondary | ICD-10-CM | POA: Insufficient documentation

## 2024-08-29 DIAGNOSIS — R011 Cardiac murmur, unspecified: Secondary | ICD-10-CM | POA: Insufficient documentation

## 2024-08-29 DIAGNOSIS — I4891 Unspecified atrial fibrillation: Secondary | ICD-10-CM | POA: Insufficient documentation

## 2024-08-29 DIAGNOSIS — G43909 Migraine, unspecified, not intractable, without status migrainosus: Secondary | ICD-10-CM | POA: Insufficient documentation

## 2024-08-29 DIAGNOSIS — I10 Essential (primary) hypertension: Secondary | ICD-10-CM | POA: Insufficient documentation

## 2024-08-29 MED ORDER — VALSARTAN 160 MG TABLET
160.0000 mg | ORAL_TABLET | Freq: Every day | ORAL | 0 refills | Status: DC
Start: 2024-08-29 — End: 2024-09-26

## 2024-08-29 NOTE — Progress Notes (Signed)
 FAMILY MEDICINE, [facility] FAMILY MEDICINE [facility]  [address]  [place] [address]  Operated by [facility] [place]     Name: Ricky Grimes MRN:  [id number]   Date of Birth: December 25, 1950 Age: 73 y.o.   Date: 08/29/2024  Time: 09:20     Provider: Greig LITTIE Mills, FNP    Reason for visit: Follow Up (1 week follow-up/)        History of Present Illness:   History of Present Illness  Ricky Grimes is a 73 year old male with hypertension who presents with concerns about blood pressure management.    He stopped taking his blood pressure medication four days ago due to consistently low readings, around 106/50 mmHg, fearing further reduction. His blood pressure has remained stable without medication, ranging from 116/50 to 126/60 mmHg.    He has been taking Cardizem  consistently but discontinued valsartan  due to concerns about low blood pressure. He did not take any medications on the morning of the visit.    He experienced dizziness after walking up a hill yesterday, which he attributes to taking two aspirin  and a migraine pill. He is currently out of his migraine medication and had a migraine yesterday that disrupted his day.    His diet has not changed significantly, though he has been consuming some fruits and vegetables.    He feels a sensation of heat in his forehead when his blood pressure reaches 140 mmHg, which subsides when the pressure decreases.    No other symptoms were reported.       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     Vitamin D  deficiency 09/25/2022    Vitamin B 12 deficiency 09/25/2022    Urinary frequency 09/25/2022    Allergic rhinitis 09/25/2022    GERD with esophagitis 09/25/2022    A-fib 09/25/2022    Onychomycosis 09/25/2022    Prostate cancer screening 09/25/2022    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    Sleeping difficulties 09/25/2022    Migraines 09/25/2022    Fever blister 09/25/2022    Neck pain, chronic 09/25/2022    Chronic anticoagulation 07/29/2022    Pain in right hand 12/18/2021    Osteoarthritis of right hip 12/11/2021    Trochanteric bursitis of right hip 12/11/2021       Historical Data    Past Medical History:  Past Medical History:   Diagnosis Date    Anticoagulant long-term use     Anxiety     Atrial fibrillation     GERD (gastroesophageal reflux disease)     Hip fracture     right hip no surgery needed    Hypercholesterolemia     Hypertension     Migraine     Rectal bleeding 07/29/2022    S/P rotator cuff repair 07/12/2012    Wears glasses      Past Surgical History:  Past Surgical History:   Procedure Laterality Date    KNEE ARTHROSCOPY Left  Allergies:  Allergies[1]  Medications:  Current Outpatient Medications   Medication Sig    aspirin  (ECOTRIN) 81 mg Oral Tablet, Delayed Release (E.C.) Take 1 Tablet (81 mg total) by mouth Daily    clonazePAM  (KLONOPIN ) 1 mg Oral Tablet Take 1 Tablet (1 mg total) by mouth Twice daily Indications: Anxiety and sleep aid.    dicyclomine  (BENTYL ) 10 mg Oral Capsule Take 1 Capsule (10 mg total) by mouth Three times a day as needed    dilTIAZem  (CARDIZEM  CD) 240 mg Oral Capsule, Sust. Release 24 hr Take 1 Capsule (240 mg total) by mouth Every morning Do not take if sbp<115 or heart rate<60    empagliflozin  (JARDIANCE ) 10 mg Oral Tablet Take 1 Tablet (10 mg total) by mouth Daily    linaCLOtide  (LINZESS ) 145 mcg Oral Capsule Take 1 Capsule (145 mcg total) by mouth Every morning Indications: irritable bowel syndrome with constipation    montelukast   (SINGULAIR ) 10 mg Oral Tablet Take 1 Tablet (10 mg total) by mouth Once per day as needed    NALOXONE 4 MG/ACTUATION NASAL SPRAY - ED TO GO 1 Spray by INTRANASAL route Every 2 minutes as needed for actual or suspected opioid overdose. Call 911 if used.    nitroGLYCERIN  (NITROSTAT ) 0.4 mg Sublingual Tablet, Sublingual Place 1 Tablet (0.4 mg total) under the tongue Every 5 minutes as needed for Chest pain for 3 doses over 15 minutes    pantoprazole  (PROTONIX ) 40 mg Oral Tablet, Delayed Release (E.C.) Take 1 Tablet (40 mg total) by mouth Daily    rimegepant (NURTEC ODT ) 75 mg Oral Tablet, Rapid Dissolve Place 1 Tablet (75 mg total) under the tongue Once per day as needed for Migraine    rivaroxaban  (XARELTO ) 20 mg Oral Tablet Take 1 Tablet (20 mg total) by mouth Daily Indications: treatment to prevent blood clots in chronic atrial fibrillation    rosuvastatin  (CRESTOR ) 10 mg Oral Tablet Take 1 Tablet (10 mg total) by mouth Every evening    Sildenafil  (VIAGRA ) 100 mg Oral Tablet Take 1 Tablet (100 mg total) by mouth Every 24 hours as needed Indications: the inability to have an erection    solifenacin  (VESICARE ) 10 mg Oral Tablet Take 1 Tablet (10 mg total) by mouth Daily    sucralfate  (CARAFATE ) 1 gram Oral Tablet Take 1 Tablet (1 g total) by mouth Every 6 hours    tamsulosin  (FLOMAX ) 0.4 mg Oral Capsule Take 1 Capsule (0.4 mg total) by mouth Every evening after dinner    terbinafine  HCL (LAMISIL ) 250 mg Oral Tablet Take 1 Tablet (250 mg total) by mouth Daily    valsartan  (DIOVAN ) 160 mg Oral Tablet Take 1 Tablet (160 mg total) by mouth Daily Indications: high blood pressure     Family History:  Family Medical History:       Problem Relation (Age of Onset)    Atrial fibrillation Father    Cancer Father    No Known Problems Mother, Sister, Brother, Half-Sister, Half-Brother, Maternal Aunt, Maternal Uncle, Paternal Aunt, Paternal Uncle, Maternal Grandmother, Maternal Grandfather, Paternal Grandmother, Paternal  Grandfather, Daughter, Son            Social History:  Social History     Socioeconomic History    Marital status: Widowed    Number of children: 2    Years of education: 12   Occupational History    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   Tobacco Use    Smoking status: Never    Smokeless tobacco: Never  Vaping Use    Vaping status: Never Used   Substance and Sexual Activity    Alcohol use: Yes     Alcohol/week: 14.0 standard drinks of alcohol     Types: 14 Cans of beer per week     Comment: per week    Drug use: Never    Sexual activity: Yes     Partners: Female     Birth control/protection: None     Social Determinants of Health     Financial Resource Strain: Low Risk (03/16/2023)    Financial Resource Strain     SDOH Financial: No   Transportation Needs: Low Risk (03/16/2023)    Transportation Needs     SDOH Transportation: No   Social Connections: Low Risk (08/09/2024)    Social Connections     SDOH Social Isolation: 5 or more times a week   Intimate Partner Violence: Low Risk (07/30/2022)    Intimate Partner Violence     SDOH Domestic Violence: No   Housing Stability: Low Risk (03/16/2023)    Housing Stability     SDOH Housing Situation: I have housing.     SDOH Housing Worry: No           Review of Systems:  Any pertinent Review of Systems as addressed in the HPI above.    Physical Exam:  Vital Signs:  Vitals:    08/29/24 0844   BP: (!) 136/52   Pulse: 80   Resp: 18   Temp: 36.6 C (97.8 F)   TempSrc: Temporal   SpO2: 98%   Weight: 78.2 kg (172 lb 6 oz)   Height: 1.727 m (5' 8)   BMI: 26.21     Physical Exam  Vitals reviewed.   Constitutional:       Appearance: Normal appearance. He is well-developed and well-groomed. He is not ill-appearing.   HENT:      Head: Normocephalic and atraumatic.      Nose: Nose normal.      Mouth/Throat:      Mouth: Mucous membranes are moist.   Eyes:      General: No scleral icterus.     Conjunctiva/sclera: Conjunctivae normal.   Cardiovascular:      Rate and Rhythm: Normal rate and regular  rhythm.      Pulses: Normal pulses.      Heart sounds: S1 normal and S2 normal. Murmur heard.   Pulmonary:      Effort: Pulmonary effort is normal.      Breath sounds: Normal breath sounds. No decreased breath sounds, wheezing, rhonchi or rales.   Abdominal:      Palpations: Abdomen is soft.   Musculoskeletal:         General: No swelling.      Cervical back: Neck supple.   Skin:     General: Skin is warm.      Coloration: Skin is not cyanotic or pale.      Nails: There is no clubbing.   Neurological:      Mental Status: He is alert and oriented to person, place, and time. Mental status is at baseline.   Psychiatric:         Behavior: Behavior is cooperative.         Results:  Lab Results   Component Value Date    VITAMIND25 28.84 (L) 07/05/2024      CBC  Diff   Lab Results   Component Value Date/Time    WBC 4.6 08/13/2024 10:08  PM    HGB 13.4 08/13/2024 10:08 PM    HCT 39.5 08/13/2024 10:08 PM    PLTCNT 191 08/13/2024 10:08 PM    RBC 4.01 (L) 08/13/2024 10:08 PM    MCV 98.4 (H) 08/13/2024 10:08 PM    MCHC 33.9 08/13/2024 10:08 PM    MCH 33.4 08/13/2024 10:08 PM    RDW 15.6 08/13/2024 10:08 PM    MPV 8.0 08/13/2024 10:08 PM    Lab Results   Component Value Date/Time    PMNS 62 08/13/2024 10:08 PM    LYMPHOCYTES 30 08/13/2024 10:08 PM    EOSINOPHIL 2 08/13/2024 10:08 PM    MONOCYTES 7 08/13/2024 10:08 PM    BASOPHILS 0 08/13/2024 10:08 PM    BASOPHILS 0.01 08/13/2024 10:08 PM    PMNABS 2.80 08/13/2024 10:08 PM    LYMPHSABS 1.36 08/13/2024 10:08 PM    EOSABS 0.07 08/13/2024 10:08 PM    MONOSABS 0.31 08/13/2024 10:08 PM           COMPREHENSIVE METABOLIC PANEL  Lab Results   Component Value Date    SODIUM 144 08/13/2024    POTASSIUM 3.7 08/13/2024    CHLORIDE 107 08/13/2024    CO2 23 08/13/2024    ANIONGAP 14 (H) 08/13/2024    BUN 19 (H) 08/13/2024    CREATININE 1.41 (H) 08/13/2024    GLUCOSENF 84 08/13/2024    GLUCOSE negative 08/15/2024    CALCIUM 9.9 08/13/2024    ALBUMIN 4.1 08/13/2024    TOTALPROTEIN 7.2 08/13/2024     ALKPHOS 56 08/13/2024    AST 29 08/13/2024    ALT 26 08/13/2024    GFR 52 (L) 08/13/2024                 Lab Results   Component Value Date    CHOLESTEROL 199 09/25/2022    TRIG 137 09/25/2022    HDLCHOL 56 09/25/2022    LDLCHOL 116 (H) 09/25/2022    VLDLCAL 27 09/25/2022    CHOLHDLRATIO 3.6 09/25/2022      Lab Results   Component Value Date    TSH 1.473 07/05/2024     Lab Results   Component Value Date    HA1C 5.5 04/02/2022            ICD-10-CM    1. Primary hypertension  I10            Assessment/Plan:  Assessment & Plan  Uncontrolled Hypertension  Unstable management due to valsartan  discontinuation. Current low readings suggest hypotension risk.  - patient educated on labile blood pressure, and the need for a medication steady state in blood to fully evaluate effectiveness of medication, along with fear of rebound hypertension.  - I have asked him again, to quit self medicating and call office before making medication changes.  - Restart valsartan  at a lower dose, 160 mg daily.  - Reassess blood pressure in one week.-patient did not do a log as requested at previous visit.  - Obtain consistent blood pressure log. Log given for convenience  - Advise dietary sodium reduction.       History of atrial fibrillation  -continue Cardizem  240 mg daily.  -continue Xarelto  20 mg daily.  -advised not to take aspirin  in addition to Xarelto .    Migraine  -educated to call pharmacy for refill on his Nurtec.  -again educated not to self medicate as this could be dangerous to his health.          Orders Placed  This Encounter    valsartan  (DIOVAN ) 160 mg Oral Tablet         Return in about 1 week (around 09/05/2024) for BP follow up.    Shermeka Rutt L Jefferey Lippmann, FNP-C     This note was created with assistance from [employer] via capture of conversational audio.  Consent was obtained from the patient prior to recording.      Portions of this note may be dictated using voice recognition software or a dictation service. Variances in spelling  and vocabulary are possible and unintentional. Not all errors are caught/corrected. Please notify the dino if any discrepancies are noted or if the meaning of any statement is not clear.          [1]   Allergies  Allergen Reactions    Metoclopramide  Other Adverse Reaction (Add comment)     Shakes unable to be still

## 2024-08-29 NOTE — Nursing Note (Signed)
 Patient here today for 1 week follow-up for BP. Patient states that he has not taken his BP medication for 4 days because it was low.

## 2024-08-29 NOTE — Patient Instructions (Signed)
 VISIT SUMMARY:  Today, we discussed your concerns about managing your blood pressure and your recent experience with dizziness and migraines. You mentioned stopping your blood pressure medication due to low readings and experiencing dizziness after taking aspirin  and a migraine pill. We reviewed your current medications and symptoms to create a plan moving forward.    YOUR PLAN:  -HYPERTENSION: Hypertension, or high blood pressure, is when the force of the blood against your artery walls is too high. You stopped taking valsartan  due to low readings, but we recommend restarting valsartan  at 160 mg daily. Please keep a consistent log of your blood pressure and reduce dietary sodium. We will reassess your blood pressure in one week.    -MIGRAINE: A migraine is a type of headache that can cause severe pain and other symptoms. Your dizziness was likely due to taking aspirin  and your migraine medication together. Please adhere to the recommended dosage for your migraine medication.    INSTRUCTIONS:  Please restart valsartan  at 160 mg daily and keep a consistent log of your blood pressure readings. Reduce your dietary sodium intake. We will reassess your blood pressure in one week. Additionally, adhere to the recommended dosage for your migraine medication.

## 2024-09-05 ENCOUNTER — Ambulatory Visit (RURAL_HEALTH_CENTER): Payer: Self-pay | Admitting: Family

## 2024-09-06 ENCOUNTER — Ambulatory Visit (RURAL_HEALTH_CENTER): Payer: Self-pay | Admitting: Family

## 2024-09-12 ENCOUNTER — Ambulatory Visit (RURAL_HEALTH_CENTER): Payer: Self-pay | Admitting: Family

## 2024-09-19 ENCOUNTER — Ambulatory Visit (RURAL_HEALTH_CENTER): Payer: Self-pay | Admitting: Family

## 2024-09-25 ENCOUNTER — Other Ambulatory Visit (RURAL_HEALTH_CENTER): Payer: Self-pay | Admitting: Family

## 2024-09-26 ENCOUNTER — Other Ambulatory Visit (RURAL_HEALTH_CENTER): Payer: Self-pay | Admitting: Family Medicine

## 2024-09-26 ENCOUNTER — Telehealth (RURAL_HEALTH_CENTER): Payer: Self-pay | Admitting: Family Medicine

## 2024-09-26 MED ORDER — RIVAROXABAN 20 MG TABLET
20.0000 mg | ORAL_TABLET | Freq: Every day | ORAL | 3 refills | Status: AC
Start: 2024-09-26 — End: 2025-09-26

## 2024-09-26 MED ORDER — VALSARTAN 160 MG TABLET
160.0000 mg | ORAL_TABLET | Freq: Every day | ORAL | 3 refills | Status: DC
Start: 2024-09-26 — End: 2024-10-07

## 2024-10-04 ENCOUNTER — Ambulatory Visit (RURAL_HEALTH_CENTER): Payer: Self-pay | Attending: Family Medicine | Admitting: Family Medicine

## 2024-10-04 ENCOUNTER — Encounter (RURAL_HEALTH_CENTER): Payer: Self-pay | Admitting: Family Medicine

## 2024-10-04 ENCOUNTER — Other Ambulatory Visit: Payer: Self-pay

## 2024-10-04 VITALS — BP 130/70 | HR 65 | Temp 96.3°F | Ht 68.0 in | Wt 170.0 lb

## 2024-10-04 DIAGNOSIS — I10 Essential (primary) hypertension: Secondary | ICD-10-CM | POA: Insufficient documentation

## 2024-10-04 DIAGNOSIS — K581 Irritable bowel syndrome with constipation: Secondary | ICD-10-CM | POA: Insufficient documentation

## 2024-10-04 MED ORDER — VALSARTAN 160 MG TABLET
160.0000 mg | ORAL_TABLET | Freq: Two times a day (BID) | ORAL | 4 refills | Status: AC
Start: 2024-10-04 — End: ?

## 2024-10-04 MED ORDER — LINACLOTIDE 290 MCG CAPSULE
290.0000 ug | ORAL_CAPSULE | Freq: Every morning | ORAL | 3 refills | Status: AC
Start: 2024-10-04 — End: ?

## 2024-10-04 MED ORDER — MAGNESIUM CITRATE ORAL SOLUTION
296.0000 mL | Freq: Once | ORAL | 0 refills | Status: AC
Start: 2024-10-04 — End: 2024-10-04

## 2024-10-04 NOTE — Progress Notes (Signed)
 FAMILY MEDICINE, [facility] FAMILY MEDICINE [facility]  [address]  [place] [address]  Operated by [facility]     Name: Ricky Grimes MRN:  [id number]   Date of Birth: December 20, 1950 Age: 73 y.o.   Date: 10/04/2024  Time: 12:16     Provider: Jhonny Romano, DO    Assessment/Plan:  Problem List Items Addressed This Visit          Cardiovascular System    Hypertension - Primary    Relevant Medications    valsartan  (DIOVAN ) 160 mg Oral Tablet       Digestive    Irritable bowel syndrome with constipation     Increase linzess  290 mcg [id number]  exp 03/2025. Only having BM's every 3-4 days. Stop 145 mcg dose. Make sure to take on an empty stomach       BP not well controlled. Increase diovan  160mg  po bid. Conitnue diltiazem     Reason for visit: Follow Up (routine)      History of Present Illness:  Ricky Grimes is a 73 y.o. 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 Has a list of readings with some levels 140-150's. He took diovan  320mg  but he got concerned as his systolic BP dropped to 100's. NO lightheaded symptoms    Constipation despite linzess . Only with BM every 2-4 days  Patient Active Problem List   Diagnosis    Chronic anticoagulation    Hypercholesterolemia    Hypertension    Irritable bowel syndrome with constipation    Vitamin D  deficiency    Vitamin B 12 deficiency    Urinary frequency    Allergic rhinitis    GERD with esophagitis    A-fib    Onychomycosis    Prostate cancer screening    Chronic prescription benzodiazepine use    Sleeping difficulties    Migraines    Fever blister    Neck pain, chronic    Sinusitis    Chronic kidney disease (CKD), active medical management without dialysis, stage 2 (mild)    Hip pain, right    Overweight    Osteoarthritis of right hip    Pain in right hand    Trochanteric bursitis of right hip     Elevated troponin    Murmur    Chest pain    Anxiety    Moderate to severe aortic insufficiency        Historical Data    Past Medical History:  Past Medical History:   Diagnosis Date    Anticoagulant long-term use     Anxiety     Atrial fibrillation     GERD (gastroesophageal reflux disease)     Hip fracture     right hip no surgery needed    Hypercholesterolemia     Hypertension     Migraine     Rectal bleeding 07/29/2022    S/P rotator cuff repair 07/12/2012    Wears glasses          Past Surgical History:  Past Surgical History:   Procedure Laterality Date    KNEE ARTHROSCOPY Left          Allergies:  Allergies   Allergen Reactions    Metoclopramide  Other Adverse Reaction (Add comment)     Shakes unable to be still  Medications:  aspirin  (ECOTRIN) 81 mg Oral Tablet, Delayed Release (E.C.), Take 1 Tablet (81 mg total) by mouth Daily  clonazePAM  (KLONOPIN ) 1 mg Oral Tablet, Take 1 Tablet (1 mg total) by mouth Twice daily Indications: Anxiety and sleep aid.  dicyclomine  (BENTYL ) 10 mg Oral Capsule, Take 1 Capsule (10 mg total) by mouth Three times a day as needed  dilTIAZem  (CARDIZEM  CD) 240 mg Oral Capsule, Sust. Release 24 hr, Take 1 Capsule (240 mg total) by mouth Every morning Do not take if sbp<115 or heart rate<60  empagliflozin  (JARDIANCE ) 10 mg Oral Tablet, Take 1 Tablet (10 mg total) by mouth Daily  montelukast  (SINGULAIR ) 10 mg Oral Tablet, Take 1 Tablet (10 mg total) by mouth Once per day as needed  NALOXONE 4 MG/ACTUATION NASAL SPRAY - ED TO GO, 1 Spray by INTRANASAL route Every 2 minutes as needed for actual or suspected opioid overdose. Call 911 if used.  nitroGLYCERIN  (NITROSTAT ) 0.4 mg Sublingual Tablet, Sublingual, Place 1 Tablet (0.4 mg total) under the tongue Every 5 minutes as needed for Chest pain for 3 doses over 15 minutes  pantoprazole  (PROTONIX ) 40 mg Oral Tablet, Delayed Release (E.C.), Take 1 Tablet (40 mg total) by mouth Daily  rimegepant (NURTEC ODT ) 75 mg Oral Tablet, Rapid  Dissolve, Place 1 Tablet (75 mg total) under the tongue Once per day as needed for Migraine  rivaroxaban  (XARELTO ) 20 mg Oral Tablet, Take 1 Tablet (20 mg total) by mouth Daily Indications: treatment to prevent blood clots in chronic atrial fibrillation  rosuvastatin  (CRESTOR ) 10 mg Oral Tablet, Take 1 Tablet (10 mg total) by mouth Every evening  Sildenafil  (VIAGRA ) 100 mg Oral Tablet, Take 1 Tablet (100 mg total) by mouth Every 24 hours as needed Indications: the inability to have an erection  solifenacin  (VESICARE ) 10 mg Oral Tablet, Take 1 Tablet (10 mg total) by mouth Daily  sucralfate  (CARAFATE ) 1 gram Oral Tablet, Take 1 Tablet (1 g total) by mouth Every 6 hours  tamsulosin  (FLOMAX ) 0.4 mg Oral Capsule, Take 1 Capsule (0.4 mg total) by mouth Every evening after dinner  valsartan  (DIOVAN ) 160 mg Oral Tablet, Take 1 Tablet (160 mg total) by mouth Daily Indications: high blood pressure  linaCLOtide  (LINZESS ) 145 mcg Oral Capsule, Take 1 Capsule (145 mcg total) by mouth Every morning Indications: irritable bowel syndrome with constipation  terbinafine  HCL (LAMISIL ) 250 mg Oral Tablet, Take 1 Tablet (250 mg total) by mouth Daily (Patient not taking: Reported on 10/04/2024)    No facility-administered medications prior to visit.     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   Tobacco Use    Smoking status: Never    Smokeless tobacco: Never   Vaping Use    Vaping status: Never Used   Substance and Sexual  Activity    Alcohol use: Yes     Alcohol/week: 14.0 standard drinks of alcohol  Types: 14 Cans of beer per week     Comment: per week    Drug use: Never    Sexual activity: Yes     Partners: Female     Birth control/protection: None     Social Determinants of Health     Financial Resource Strain: Low Risk (03/16/2023)    Financial Resource Strain     SDOH Financial: No   Transportation Needs: Low Risk (03/16/2023)    Transportation Needs     SDOH Transportation: No   Social Connections: Low Risk (08/09/2024)    Social Connections     SDOH Social Isolation: 5 or more times a week   Intimate Partner Violence: Low Risk (07/30/2022)    Intimate Partner Violence     SDOH Domestic Violence: No   Housing Stability: Low Risk (03/16/2023)    Housing Stability     SDOH Housing Situation: I have housing.     SDOH Housing Worry: No           Review of Systems:  Any pertinent Review of Systems as addressed in the HPI above.    Physical Exam:  Vital Signs:  Vitals:    10/04/24 1128 10/04/24 1135   BP: (!) 160/80 130/70   Pulse: 65    Temp: (!) 35.7 C (96.3 F)    TempSrc: Tympanic    SpO2: 99%    Weight: 77.1 kg (170 lb)    Height: 1.727 m (5' 8)    BMI: 25.85      Physical Exam  Vitals reviewed.   Constitutional:       General: He is not in acute distress.     Appearance: Normal appearance.   HENT:      Head: Normocephalic and atraumatic.      Right Ear: Tympanic membrane normal. There is no impacted cerumen.      Left Ear: Tympanic membrane normal. There is no impacted cerumen.      Nose: No congestion or rhinorrhea.      Mouth/Throat:      Pharynx: Posterior oropharyngeal erythema present. No oropharyngeal exudate.   Eyes:      General: No scleral icterus.        Right eye: No discharge.         Left eye: No discharge.   Neck:      Vascular: No carotid bruit.   Cardiovascular:      Rate and Rhythm: Normal rate and regular rhythm.      Pulses: Normal pulses.      Heart sounds: Normal heart sounds.   Pulmonary:      Effort:  Pulmonary effort is normal.      Breath sounds: Normal breath sounds.      Comments: Decreased breath sounds bilateral bases  Abdominal:      Palpations: Abdomen is soft.      Tenderness: There is no abdominal tenderness.   Musculoskeletal:      Right lower leg: No edema.      Left lower leg: No edema.   Lymphadenopathy:      Cervical: No cervical adenopathy.   Skin:     General: Skin is warm and dry.      Capillary Refill: Capillary refill takes less than 2 seconds.      Findings: No rash.      Comments: Skin in ear healed well   Neurological:      General: No focal deficit present.      Mental  Status: He is alert and oriented to person, place, and time.                Jhonny Romano, DO     Portions of this note may be dictated using voice recognition software or a dictation service. Variances in spelling and vocabulary are possible and unintentional. Not all errors are caught/corrected. Please notify the dino if any discrepancies are noted or if the meaning of any statement is not clear.

## 2024-10-11 ENCOUNTER — Other Ambulatory Visit (RURAL_HEALTH_CENTER): Payer: Self-pay | Admitting: Family Medicine

## 2024-12-03 ENCOUNTER — Other Ambulatory Visit (RURAL_HEALTH_CENTER): Payer: Self-pay | Admitting: Family Medicine

## 2024-12-12 ENCOUNTER — Other Ambulatory Visit (RURAL_HEALTH_CENTER): Payer: Self-pay | Admitting: Family Medicine
